# Patient Record
Sex: Female | Born: 1960 | Race: White | Hispanic: No | Marital: Married | Smoking: Never smoker
Health system: Southern US, Community
[De-identification: ages and names within clinical notes are randomized; demographics above are authoritative.]

## PROBLEM LIST (undated history)

## (undated) DIAGNOSIS — M8580 Other specified disorders of bone density and structure, unspecified site: Secondary | ICD-10-CM

## (undated) DIAGNOSIS — B019 Varicella without complication: Secondary | ICD-10-CM

## (undated) DIAGNOSIS — E559 Vitamin D deficiency, unspecified: Secondary | ICD-10-CM

## (undated) DIAGNOSIS — F329 Major depressive disorder, single episode, unspecified: Secondary | ICD-10-CM

## (undated) DIAGNOSIS — I10 Essential (primary) hypertension: Secondary | ICD-10-CM

## (undated) DIAGNOSIS — M533 Sacrococcygeal disorders, not elsewhere classified: Secondary | ICD-10-CM

## (undated) DIAGNOSIS — Z0000 Encounter for general adult medical examination without abnormal findings: Secondary | ICD-10-CM

## (undated) DIAGNOSIS — F419 Anxiety disorder, unspecified: Secondary | ICD-10-CM

## (undated) DIAGNOSIS — Z800 Family history of malignant neoplasm of digestive organs: Secondary | ICD-10-CM

## (undated) DIAGNOSIS — R7989 Other specified abnormal findings of blood chemistry: Secondary | ICD-10-CM

## (undated) DIAGNOSIS — R030 Elevated blood-pressure reading, without diagnosis of hypertension: Secondary | ICD-10-CM

## (undated) DIAGNOSIS — D649 Anemia, unspecified: Secondary | ICD-10-CM

## (undated) DIAGNOSIS — R000 Tachycardia, unspecified: Secondary | ICD-10-CM

## (undated) DIAGNOSIS — F32A Depression, unspecified: Secondary | ICD-10-CM

## (undated) DIAGNOSIS — E785 Hyperlipidemia, unspecified: Secondary | ICD-10-CM

## (undated) DIAGNOSIS — E663 Overweight: Secondary | ICD-10-CM

## (undated) DIAGNOSIS — K219 Gastro-esophageal reflux disease without esophagitis: Secondary | ICD-10-CM

## (undated) HISTORY — DX: Anxiety disorder, unspecified: F41.9

## (undated) HISTORY — DX: Major depressive disorder, single episode, unspecified: F32.9

## (undated) HISTORY — DX: Varicella without complication: B01.9

## (undated) HISTORY — DX: Overweight: E66.3

## (undated) HISTORY — DX: Tachycardia, unspecified: R00.0

## (undated) HISTORY — DX: Anemia, unspecified: D64.9

## (undated) HISTORY — DX: Elevated blood-pressure reading, without diagnosis of hypertension: R03.0

## (undated) HISTORY — DX: Other specified disorders of bone density and structure, unspecified site: M85.80

## (undated) HISTORY — DX: Hyperlipidemia, unspecified: E78.5

## (undated) HISTORY — PX: COLONOSCOPY: SHX174

## (undated) HISTORY — DX: Other specified abnormal findings of blood chemistry: R79.89

## (undated) HISTORY — DX: Vitamin D deficiency, unspecified: E55.9

## (undated) HISTORY — DX: Encounter for general adult medical examination without abnormal findings: Z00.00

## (undated) HISTORY — DX: Depression, unspecified: F32.A

## (undated) HISTORY — DX: Sacrococcygeal disorders, not elsewhere classified: M53.3

## (undated) HISTORY — DX: Family history of malignant neoplasm of digestive organs: Z80.0

## (undated) HISTORY — DX: Gastro-esophageal reflux disease without esophagitis: K21.9

---

## 2000-09-05 ENCOUNTER — Other Ambulatory Visit: Admission: RE | Admit: 2000-09-05 | Discharge: 2000-09-05 | Payer: Self-pay | Admitting: *Deleted

## 2002-12-11 ENCOUNTER — Emergency Department (HOSPITAL_COMMUNITY): Admission: EM | Admit: 2002-12-11 | Discharge: 2002-12-11 | Payer: Self-pay | Admitting: Emergency Medicine

## 2002-12-11 ENCOUNTER — Encounter: Payer: Self-pay | Admitting: Emergency Medicine

## 2003-01-15 ENCOUNTER — Other Ambulatory Visit: Admission: RE | Admit: 2003-01-15 | Discharge: 2003-01-15 | Payer: Self-pay | Admitting: Obstetrics and Gynecology

## 2004-07-31 LAB — HM COLONOSCOPY

## 2004-10-27 ENCOUNTER — Other Ambulatory Visit: Admission: RE | Admit: 2004-10-27 | Discharge: 2004-10-27 | Payer: Self-pay | Admitting: Obstetrics and Gynecology

## 2004-11-28 ENCOUNTER — Encounter: Admission: RE | Admit: 2004-11-28 | Discharge: 2004-11-28 | Payer: Self-pay | Admitting: *Deleted

## 2004-12-15 ENCOUNTER — Encounter: Admission: RE | Admit: 2004-12-15 | Discharge: 2004-12-15 | Payer: Self-pay | Admitting: *Deleted

## 2005-07-31 HISTORY — PX: GUM SURGERY: SHX658

## 2006-02-12 ENCOUNTER — Encounter: Admission: RE | Admit: 2006-02-12 | Discharge: 2006-02-12 | Payer: Self-pay | Admitting: Obstetrics and Gynecology

## 2006-05-09 ENCOUNTER — Other Ambulatory Visit: Admission: RE | Admit: 2006-05-09 | Discharge: 2006-05-09 | Payer: Self-pay | Admitting: Obstetrics and Gynecology

## 2007-02-15 ENCOUNTER — Ambulatory Visit (HOSPITAL_BASED_OUTPATIENT_CLINIC_OR_DEPARTMENT_OTHER): Admission: RE | Admit: 2007-02-15 | Discharge: 2007-02-15 | Payer: Self-pay | Admitting: *Deleted

## 2007-08-01 HISTORY — PX: OTHER SURGICAL HISTORY: SHX169

## 2010-04-19 ENCOUNTER — Encounter: Admission: RE | Admit: 2010-04-19 | Discharge: 2010-04-19 | Payer: Self-pay | Admitting: Obstetrics and Gynecology

## 2010-08-21 ENCOUNTER — Encounter (HOSPITAL_COMMUNITY): Payer: Self-pay | Admitting: Obstetrics and Gynecology

## 2010-12-13 NOTE — Op Note (Signed)
Sheryl [name], Sheryl [name]             ACCOUNT NO.:  [id number]   MEDICAL RECORD NO.:  [id number]          PATIENT TYPE:  AMB   LOCATION:  DSC                          FACILITY:  [facility]   PHYSICIAN:  Tennis Must Meyerdierks, M.D.DATE OF BIRTH:  25-Sep-1960   DATE OF PROCEDURE:  02/15/2007  DATE OF DISCHARGE:                               OPERATIVE REPORT   PREOPERATIVE DIAGNOSIS:  Abscess, right hand, metatarsophalangeal joint  of the right long finger.   POSTOPERATIVE DIAGNOSIS:  Abscess, right hand, metatarsophalangeal joint  of the right long finger.   PROCEDURE:  Incision and drainage of abscess of right hand.   SURGEON:  Lowell Bouton, M.D.   ANESTHESIA:  General.   OPERATIVE FINDINGS:  The patient had an abscess in the subcutaneous  tissue surrounding the MP joint.  The joint was opened and an arthrotomy  was performed, but there was no gross purulence.   PROCEDURE:  Under general anesthesia with a tourniquet on the right arm,  the right hand was prepped and draped in the usual fashion and after  elevating the limb, the tourniquet was inflated to 250 mmHg.  A  longitudinal incision was made over the dorsum of the MP joint of the  right long finger and carried through the subcutaneous tissues.  The  abscess was cultured.  There was gross purulent material.  A  longitudinal incision was then made in the radial side of the capsule of  the MP joint and it was opened down to the joint.  A Freer elevator was  placed in the joint and the articular surfaces were smooth.  There was  no gross pus in the joint.  The wound was irrigated copiously with  saline; 0.5% Marcaine was placed in the skin edges for pain control.  The wound was packed open and one suture was placed using 4-0 nylon.  Sterile dressings were applied followed by a protective splint.  The  patient tolerated the procedure well and went to the recovery room,  awake and stable in good condition.      Lowell Bouton, M.D.  Electronically Signed     EMM/MEDQ  D:  02/15/2007  T:  02/16/2007  Job:  [id number]   cc:   Ace Gins, MD

## 2011-05-15 LAB — ANAEROBIC CULTURE

## 2011-05-15 LAB — CULTURE, ROUTINE-ABSCESS: Culture: NO GROWTH

## 2011-06-07 ENCOUNTER — Encounter: Payer: Self-pay | Admitting: Family Medicine

## 2011-06-07 ENCOUNTER — Ambulatory Visit (INDEPENDENT_AMBULATORY_CARE_PROVIDER_SITE_OTHER): Payer: 59 | Admitting: Family Medicine

## 2011-06-07 VITALS — BP 121/89 | HR 93 | Temp 97.9°F | Ht 60.75 in | Wt 158.8 lb

## 2011-06-07 DIAGNOSIS — F329 Major depressive disorder, single episode, unspecified: Secondary | ICD-10-CM | POA: Insufficient documentation

## 2011-06-07 DIAGNOSIS — F32A Depression, unspecified: Secondary | ICD-10-CM | POA: Insufficient documentation

## 2011-06-07 DIAGNOSIS — R5383 Other fatigue: Secondary | ICD-10-CM

## 2011-06-07 DIAGNOSIS — Z23 Encounter for immunization: Secondary | ICD-10-CM

## 2011-06-07 DIAGNOSIS — B019 Varicella without complication: Secondary | ICD-10-CM | POA: Insufficient documentation

## 2011-06-07 DIAGNOSIS — R5381 Other malaise: Secondary | ICD-10-CM

## 2011-06-07 DIAGNOSIS — Z0000 Encounter for general adult medical examination without abnormal findings: Secondary | ICD-10-CM

## 2011-06-07 DIAGNOSIS — F419 Anxiety disorder, unspecified: Secondary | ICD-10-CM

## 2011-06-07 DIAGNOSIS — F411 Generalized anxiety disorder: Secondary | ICD-10-CM

## 2011-06-07 DIAGNOSIS — D649 Anemia, unspecified: Secondary | ICD-10-CM

## 2011-06-07 DIAGNOSIS — E669 Obesity, unspecified: Secondary | ICD-10-CM | POA: Insufficient documentation

## 2011-06-07 DIAGNOSIS — K219 Gastro-esophageal reflux disease without esophagitis: Secondary | ICD-10-CM

## 2011-06-07 DIAGNOSIS — N951 Menopausal and female climacteric states: Secondary | ICD-10-CM

## 2011-06-07 DIAGNOSIS — E663 Overweight: Secondary | ICD-10-CM

## 2011-06-07 DIAGNOSIS — Z800 Family history of malignant neoplasm of digestive organs: Secondary | ICD-10-CM

## 2011-06-07 HISTORY — DX: Encounter for general adult medical examination without abnormal findings: Z00.00

## 2011-06-07 HISTORY — DX: Family history of malignant neoplasm of digestive organs: Z80.0

## 2011-06-07 NOTE — Progress Notes (Signed)
Sheryl [name] [id number] 10-28-1960 06/07/2011      Progress Note New Patient  Subjective  Chief Complaint  Chief Complaint  Patient presents with  . Establish Care    new patient    HPI  Patient is a 50 yo caucasian female in today for new patient appt. she reports is not a primary care doctor in quite some time. Previously had her Paps done at [facility] and was seen at the [facility] clinic for infections. She has no acute concerns at this time but does have a bad family history: Cancer diabetes heart disease would like to start monitoring things. She'll history of anxiety and started having anxiety roughly 10 years ago after her breast was brought. His recent episode depressed and says overall she's doing better. She follows with Dr. Valinda Hoar and feels stable on her current medications. She uses Xanax each night for sleep and then roughly only once a week the details and the as well. No recent illness, fevers, chills, chest pain or palpitations, shortness of breath, GI or GU concerns noted. She is concerned about increased fatigue and weight gain. Does feel as if she is perimenopausal this time and is requesting lab work.  Past Medical History  Diagnosis Date  . Chicken pox as a child  . Anxiety   . GERD (gastroesophageal reflux disease)   . Depression   . Overweight   . Anemia   . Preventative health care 06/07/2011    Past Surgical History  Procedure Date  . Gum surgery 2007  . Right hand surgery 2009    infection debrided  . Cesarean section 1985 and 1989    X 2    Family History  Problem Relation Age of Onset  . Heart attack Mother   . Diabetes Mother     type 2  . Hypertension Mother   . Hyperlipidemia Mother   . Cancer Father     colon  . Heart attack Father   . Hypertension Sister   . Diabetes Sister     type 2  . Hypertension Sister   . Diabetes Maternal Grandmother   . Hypertension Maternal Grandmother   . Hyperlipidemia Maternal  Grandmother     History   Social History  . Marital Status: Married    Spouse Name: N/A    Number of Children: N/A  . Years of Education: N/A   Occupational History  . Not on file.   Social History Main Topics  . Smoking status: Never Smoker   . Smokeless tobacco: Never Used  . Alcohol Use: Yes     glass of wine nightly  . Drug Use: No  . Sexually Active: Yes -- Female partner(s)   Other Topics Concern  . Not on file   Social History Narrative  . No narrative on file    No current outpatient prescriptions on file prior to visit.    Allergies  Allergen Reactions  . Penicillins Rash    Review of Systems  Review of Systems  Constitutional: Positive for malaise/fatigue. Negative for fever, chills and weight loss.  HENT: Negative for hearing loss, nosebleeds and congestion.   Eyes: Negative for discharge.  Respiratory: Negative for cough, sputum production, shortness of breath and wheezing.   Cardiovascular: Negative for chest pain, palpitations and leg swelling.  Gastrointestinal: Negative for heartburn, nausea, vomiting, abdominal pain, diarrhea, constipation and blood in stool.  Genitourinary: Negative for dysuria, urgency, frequency and hematuria.  Musculoskeletal: Negative for myalgias, back  pain and falls.  Skin: Negative for rash.  Neurological: Negative for dizziness, tremors, sensory change, focal weakness, loss of consciousness, weakness and headaches.  Endo/Heme/Allergies: Negative for polydipsia. Does not bruise/bleed easily.  Psychiatric/Behavioral: Negative for depression and suicidal ideas. The patient is nervous/anxious and has insomnia.     Objective  BP 121/89  Pulse 93  Temp(Src) 97.9 F (36.6 C) (Oral)  Ht 5' 0.75" (1.543 m)  Wt 158 lb 12.8 oz (72.031 kg)  BMI 30.25 kg/m2  SpO2 98%  LMP 05/24/2011  Physical Exam  Physical Exam  Constitutional: She is oriented to person, place, and time and well-developed, well-nourished, and in no  distress. No distress.  HENT:  Head: Normocephalic and atraumatic.  Right Ear: External ear normal.  Left Ear: External ear normal.  Nose: Nose normal.  Mouth/Throat: Oropharynx is clear and moist. No oropharyngeal exudate.  Eyes: Conjunctivae are normal. Pupils are equal, round, and reactive to light. Right eye exhibits no discharge. Left eye exhibits no discharge. No scleral icterus.  Neck: Normal range of motion. Neck supple. No thyromegaly present.  Cardiovascular: Normal rate, regular rhythm, normal heart sounds and intact distal pulses.   No murmur heard. Pulmonary/Chest: Effort normal and breath sounds normal. No respiratory distress. She has no wheezes. She has no rales.  Abdominal: Soft. Bowel sounds are normal. She exhibits no distension and no mass. There is no tenderness.  Musculoskeletal: Normal range of motion. She exhibits no edema and no tenderness.  Lymphadenopathy:    She has no cervical adenopathy.  Neurological: She is alert and oriented to person, place, and time. She has normal reflexes. No cranial nerve deficit. Coordination normal.  Skin: Skin is warm and dry. No rash noted. She is not diaphoretic.  Psychiatric: Mood, memory and affect normal.       Assessment & Plan  Preventative health care Patient declines flu shot but agrees to Tdap today. She is due for pap and mgm  In January we will request old records and have her back to have those done.   Overweight Worsening fatigue also noted. Patient interested in having her hormones checked will check fasting labs including fsh and lh at that time.   GERD (gastroesophageal reflux disease) Patient reports good response to Pantoprazole, has tried to come off in the past but symptoms return. She is encouraged to try taking it every other day to minimize SE and to avoid spicy and fatty foods. Raise the head of the bed and do not eat too close to bed  Anxiety She feels she is doing well on her current meds and dose  not need any meds adjusted  Anemia Patient reports a previous history, will check CBC with lab work  FH: colon cancer Last colonoscopy in 2006, patient agrees to referral for repeat colonoscopy

## 2011-06-07 NOTE — Assessment & Plan Note (Signed)
She feels she is doing well on her current meds and dose not need any meds adjusted

## 2011-06-07 NOTE — Assessment & Plan Note (Signed)
Last colonoscopy in 2006, patient agrees to referral for repeat colonoscopy

## 2011-06-07 NOTE — Assessment & Plan Note (Signed)
Worsening fatigue also noted. Patient interested in having her hormones checked will check fasting labs including fsh and lh at that time.

## 2011-06-07 NOTE — Assessment & Plan Note (Signed)
Patient reports a previous history, will check CBC with lab work

## 2011-06-07 NOTE — Patient Instructions (Signed)

## 2011-06-07 NOTE — Assessment & Plan Note (Signed)
Patient declines flu shot but agrees to Tdap today. She is due for pap and mgm  In January we will request old records and have her back to have those done.

## 2011-06-07 NOTE — Assessment & Plan Note (Signed)
Patient reports good response to Pantoprazole, has tried to come off in the past but symptoms return. She is encouraged to try taking it every other day to minimize SE and to avoid spicy and fatty foods. Raise the head of the bed and do not eat too close to bed

## 2011-06-09 ENCOUNTER — Encounter: Payer: Self-pay | Admitting: Internal Medicine

## 2011-06-12 ENCOUNTER — Other Ambulatory Visit: Payer: 59

## 2011-06-27 ENCOUNTER — Ambulatory Visit (AMBULATORY_SURGERY_CENTER): Payer: 59

## 2011-06-27 ENCOUNTER — Telehealth: Payer: Self-pay | Admitting: Family Medicine

## 2011-06-27 ENCOUNTER — Encounter: Payer: Self-pay | Admitting: Internal Medicine

## 2011-06-27 VITALS — Ht 61.0 in | Wt 164.3 lb

## 2011-06-27 DIAGNOSIS — Z1211 Encounter for screening for malignant neoplasm of colon: Secondary | ICD-10-CM

## 2011-06-27 DIAGNOSIS — Z800 Family history of malignant neoplasm of digestive organs: Secondary | ICD-10-CM

## 2011-06-27 MED ORDER — PEG-KCL-NACL-NASULF-NA ASC-C 100 G PO SOLR: 1.0000 | Freq: Once | ORAL | Status: AC

## 2011-06-27 NOTE — Telephone Encounter (Signed)
Message copied by Carmelia Bake on Tue Jun 27, 2011  2:46 PM ------      Message from: Danise Edge A      Created: Tue Jun 27, 2011 12:13 PM      Regarding: RE: Medical record request       Please check with Nestor Ramp to see how much it would cost just to get last 2 paps. If still hi, have Christy check with patient about what she wants to do      ----- Message -----         From: Carmelia Bake         Sent: 06/27/2011  11:42 AM           To: Danise Edge, MD      Subject: Medical record request                                   I received a phone call from [facility] that there is a $45 charge to request records since patient hasn't been seen since 2007 and her records are in storage, do you want them to send them?

## 2011-06-27 NOTE — Telephone Encounter (Signed)
It will cost $35 to get the last 2 PAPs per Amy at [facility] [phone]

## 2011-06-28 NOTE — Telephone Encounter (Signed)
I informed patient the $35 cost for the records and she states that's ok. Please get the records for [name]

## 2011-06-28 NOTE — Telephone Encounter (Signed)
SW [employer], they would like patient to contact them to make payment arrangements. SW patient she is going to call them.

## 2011-07-07 ENCOUNTER — Telehealth: Payer: Self-pay | Admitting: Internal Medicine

## 2011-07-07 NOTE — Telephone Encounter (Signed)
Tried to reach pt several times regarding prep instructions, unable to LM. Ulis Rias RN

## 2011-07-07 NOTE — Telephone Encounter (Signed)
Called pt at home number and mobile number ,unable to leave message. Will try again. Ulis Rias RN

## 2011-07-11 ENCOUNTER — Encounter: Payer: Self-pay | Admitting: Internal Medicine

## 2011-07-11 ENCOUNTER — Ambulatory Visit (AMBULATORY_SURGERY_CENTER): Payer: 59 | Admitting: Internal Medicine

## 2011-07-11 DIAGNOSIS — D126 Benign neoplasm of colon, unspecified: Secondary | ICD-10-CM

## 2011-07-11 DIAGNOSIS — Z800 Family history of malignant neoplasm of digestive organs: Secondary | ICD-10-CM

## 2011-07-11 DIAGNOSIS — Z1211 Encounter for screening for malignant neoplasm of colon: Secondary | ICD-10-CM

## 2011-07-11 MED ORDER — SODIUM CHLORIDE 0.9 % IV SOLN: 500.0000 mL | INTRAVENOUS | Status: DC

## 2011-07-11 NOTE — Progress Notes (Signed)
Patient did not experience any of the following events: a burn prior to discharge; a fall within the facility; wrong site/side/patient/procedure/implant event; or a hospital transfer or hospital admission upon discharge from the facility. (G8907) Patient did not have preoperative order for IV antibiotic SSI prophylaxis. (G8918)  

## 2011-07-11 NOTE — Patient Instructions (Signed)
Discharge instructions per green and blue sheets  Handouts on polyps, diverticulosis and high fiber diet and hemorrhoids  Repeat colonoscopy pending pathology results. We will mail you a letter in 1-2 weeks with those results and Dr Lauro Franklin recommendations

## 2011-07-11 NOTE — Progress Notes (Signed)
Per the pt, "you know I have anxiety, I hope I don't get nervous".  I assured the pt we would take very good care of her.  I encouraged her to take some slow deep breath and I breathed with her.  Pt thanked me and smiled. Maw  Hung 2nd bag of normal saline 500 ml at 09:36. Maw  Pt was cramping with the scope advancement to the cecum.  Medications were titrated per Dr. Lauro Franklin orders.  Once the cecum was reached and the scope was being withdrawn, the pt relaxed and went to sleep and rested comfortably with her eyes closed. Maw  Pt tolerated the colonoscopy well. maw

## 2011-07-11 NOTE — Op Note (Signed)
[facility] [address] [place], [place]  [address]  COLONOSCOPY PROCEDURE REPORT  PATIENT:  Sheryl [name], Sheryl [name]  MR#:  [id number] BIRTHDATE:  11-09-1960, 50 yrs. old  GENDER:  female ENDOSCOPIST:  Carie Caddy. Magan Winnett, MD REF. BY:  Reuel Derby, M.D. PROCEDURE DATE:  07/11/2011 PROCEDURE:  Colon with cold biopsy polypectomy ASA CLASS:  Class I INDICATIONS:  Elevated Risk Screening (2nd colonoscopy), FH of colon cancer (father) MEDICATIONS:   These medications were titrated to patient response per physician's verbal order, Fentanyl 100 mcg IV, Versed 10 mg IV  DESCRIPTION OF PROCEDURE:   After the risks benefits and alternatives of the procedure were thoroughly explained, informed consent was obtained.  Digital rectal exam was performed and revealed no rectal masses.   The LB [id number]-[id number] endoscope was introduced through the anus and advanced to the terminal ileum which was intubated for a short distance, without limitations. The quality of the prep was good, using MoviPrep.  The instrument was then slowly withdrawn as the colon was fully examined. <<PROCEDUREIMAGES>>  FINDINGS:  The terminal ileum appeared normal.  Five sessile polyps measuring 2 - 5 mm were found in the ascending colon (3), hepatic flexure (1), and transverse colon (1). The polyps were removed using cold biopsy forceps.  Moderate diverticulosis was found in the sigmoid colon.  Internal Hemorrhoids were found. Retroflexed views in the rectum revealed no other findings other than those already described.  The scope was then withdrawn from the cecum and the procedure completed.  COMPLICATIONS:  None ENDOSCOPIC IMPRESSION: 1) Normal terminal ileum 2) Five sessile polyps in the ascending colon (3), hepatic flexure (1), and transverse colon (1).  Polyps removed and sent to pathology. 3) Moderate diverticulosis in the sigmoid colon 4) Small internal hemorrhoids  RECOMMENDATIONS: 1) Await pathology  results 2) High fiber diet. 3) If the polyp(s) removed today are proven to be adenomatous (pre-cancerous) polyps, you will need a colonoscopy in 3 years. Otherwise you should continue to follow colorectal cancer screening guidelines for "elevated risk" patients with a colonoscopy in 5 years, based on your father's history of colon cancer. 4) You will receive a letter within 1-2 weeks with the results of your biopsy as well as final recommendations. Please call my office if you have not received a letter after 3 weeks.  Carie Caddy. Rhea Belton, MD  CC:  Reuel Derby, MD The Patient  n. eSIGNEDCarie Caddy. Jolea Dolle at 07/11/2011 10:05 AM  Sheryl [name], [id number]

## 2011-07-12 ENCOUNTER — Telehealth: Payer: Self-pay

## 2011-07-12 NOTE — Telephone Encounter (Signed)

## 2011-07-18 ENCOUNTER — Encounter: Payer: Self-pay | Admitting: Internal Medicine

## 2011-08-08 ENCOUNTER — Other Ambulatory Visit: Payer: Self-pay | Admitting: Family Medicine

## 2011-08-08 ENCOUNTER — Ambulatory Visit (INDEPENDENT_AMBULATORY_CARE_PROVIDER_SITE_OTHER): Payer: 59 | Admitting: Family Medicine

## 2011-08-08 ENCOUNTER — Encounter: Payer: Self-pay | Admitting: Family Medicine

## 2011-08-08 DIAGNOSIS — F419 Anxiety disorder, unspecified: Secondary | ICD-10-CM

## 2011-08-08 DIAGNOSIS — Z124 Encounter for screening for malignant neoplasm of cervix: Secondary | ICD-10-CM

## 2011-08-08 DIAGNOSIS — K219 Gastro-esophageal reflux disease without esophagitis: Secondary | ICD-10-CM

## 2011-08-08 DIAGNOSIS — D649 Anemia, unspecified: Secondary | ICD-10-CM

## 2011-08-08 DIAGNOSIS — E663 Overweight: Secondary | ICD-10-CM

## 2011-08-08 DIAGNOSIS — Z800 Family history of malignant neoplasm of digestive organs: Secondary | ICD-10-CM

## 2011-08-08 DIAGNOSIS — Z01419 Encounter for gynecological examination (general) (routine) without abnormal findings: Secondary | ICD-10-CM | POA: Insufficient documentation

## 2011-08-08 DIAGNOSIS — F411 Generalized anxiety disorder: Secondary | ICD-10-CM

## 2011-08-08 DIAGNOSIS — Z0000 Encounter for general adult medical examination without abnormal findings: Secondary | ICD-10-CM

## 2011-08-08 MED ORDER — PANTOPRAZOLE SODIUM 40 MG PO TBEC: 40.0000 mg | DELAYED_RELEASE_TABLET | Freq: Every day | ORAL | Status: DC

## 2011-08-08 NOTE — Patient Instructions (Signed)

## 2011-08-09 LAB — HEPATIC FUNCTION PANEL
AST: 26 U/L (ref 0–37)
Albumin: 3.9 g/dL (ref 3.5–5.2)
Alkaline Phosphatase: 92 U/L (ref 39–117)
Total Bilirubin: 0.5 mg/dL (ref 0.3–1.2)
Total Protein: 6.7 g/dL (ref 6.0–8.3)

## 2011-08-09 LAB — BASIC METABOLIC PANEL
CO2: 21 mEq/L (ref 19–32)
Chloride: 103 mEq/L (ref 96–112)
Creat: 0.91 mg/dL (ref 0.50–1.10)
Potassium: 4.3 mEq/L (ref 3.5–5.3)
Sodium: 138 mEq/L (ref 135–145)

## 2011-08-09 LAB — LIPID PANEL
HDL: 118 mg/dL (ref >39)
LDL Cholesterol: 122 mg/dL — ABNORMAL HIGH (ref 0–99)
Total CHOL/HDL Ratio: 2.2 Ratio
Triglycerides: 104 mg/dL (ref <150)

## 2011-08-09 LAB — CBC
Platelets: 231 10*3/uL (ref 150–400)
RBC: 4.73 MIL/uL (ref 3.87–5.11)
RDW: 13.6 % (ref 11.5–15.5)
WBC: 5 10*3/uL (ref 4.0–10.5)

## 2011-08-13 ENCOUNTER — Encounter: Payer: Self-pay | Admitting: Family Medicine

## 2011-08-13 NOTE — Progress Notes (Signed)
Patient ID: Sheryl [name], female   DOB: [birth year], 51 y.o.   MRN: [id number] JADYN BRASHER [id number] 26-Oct-1960 08/13/2011      Progress Note-Follow Up  Subjective  Chief Complaint  Chief Complaint  Patient presents with  . Gynecologic Exam    pap    HPI  Patient is a 51 year old Caucasian female who is in today for GYN exam. Last Pap was 2 years ago and was normal. She offers no GYN complaints. No breast pain and tenderness lesions or concerns. She's had no recent illness, fevers, chills, chest pain, palpitations, shortness of breath, GI or GU concerns recently.  Past Medical History  Diagnosis Date  . Chicken pox as a child  . Anxiety   . GERD (gastroesophageal reflux disease)   . Depression   . Overweight   . Anemia   . Preventative health care 06/07/2011  . FH: colon cancer 06/07/2011    Past Surgical History  Procedure Date  . Gum surgery 2007  . Right hand surgery 2009    infection debrided  . Cesarean section 1985 and 1989    X 2  . Colonoscopy     Family History  Problem Relation Age of Onset  . Heart attack Mother   . Diabetes Mother     type 2  . Hypertension Mother   . Hyperlipidemia Mother   . Cancer Father     colon  . Heart attack Father   . Colon cancer Father   . Hypertension Sister   . Diabetes Sister     type 2  . Hypertension Sister   . Diabetes Maternal Grandmother   . Hypertension Maternal Grandmother   . Hyperlipidemia Maternal Grandmother     History   Social History  . Marital Status: Married    Spouse Name: N/A    Number of Children: N/A  . Years of Education: N/A   Occupational History  . Not on file.   Social History Main Topics  . Smoking status: Never Smoker   . Smokeless tobacco: Never Used  . Alcohol Use: 7.0 oz/week    14 drink(s) per week     glass of wine nightly  . Drug Use: No  . Sexually Active: Yes -- Female partner(s)   Other Topics Concern  . Not on file   Social History Narrative  . No  narrative on file    Current Outpatient Prescriptions on File Prior to Visit  Medication Sig Dispense Refill  . ALPRAZolam (XANAX) 0.25 MG tablet Take 0.25 mg by mouth at bedtime.       Marland Kitchen FLUoxetine (PROZAC) 20 MG capsule Take 20 mg by mouth daily.        . Multiple Vitamin (MULTIVITAMIN) capsule Take 1 capsule by mouth daily.        Marland Kitchen zolpidem (AMBIEN) 10 MG tablet Take 10 mg by mouth at bedtime as needed.          Allergies  Allergen Reactions  . Penicillins Rash    Review of Systems  Review of Systems  Constitutional: Negative for fever and malaise/fatigue.  HENT: Negative for congestion.   Eyes: Negative for discharge.  Respiratory: Negative for shortness of breath.   Cardiovascular: Negative for chest pain, palpitations and leg swelling.  Gastrointestinal: Negative for nausea, abdominal pain and diarrhea.  Genitourinary: Negative for dysuria.  Musculoskeletal: Negative for falls.  Skin: Negative for rash.  Neurological: Negative for loss of consciousness and headaches.  Endo/Heme/Allergies: Negative for polydipsia.  Psychiatric/Behavioral: Negative for depression and suicidal ideas. The patient is not nervous/anxious and does not have insomnia.     Objective  BP 137/88  Pulse 77  Temp(Src) 98.4 F (36.9 C) (Temporal)  Ht 5\' 1"  (1.549 m)  Wt 163 lb 12.8 oz (74.299 kg)  BMI 30.95 kg/m2  SpO2 98%  Physical Exam  Physical Exam  Constitutional: She is oriented to person, place, and time and well-developed, well-nourished, and in no distress. No distress.  HENT:  Head: Normocephalic and atraumatic.  Eyes: Conjunctivae are normal.  Neck: Neck supple. No thyromegaly present.  Cardiovascular: Normal rate, regular rhythm and normal heart sounds.   No murmur heard. Pulmonary/Chest: Effort normal and breath sounds normal. She has no wheezes.  Abdominal: She exhibits no distension and no mass.  Musculoskeletal: She exhibits no edema.  Lymphadenopathy:    She has no  cervical adenopathy.  Neurological: She is alert and oriented to person, place, and time.  Skin: Skin is warm and dry. No rash noted. She is not diaphoretic.  Psychiatric: Memory, affect and judgment normal.    Lab Results  Component Value Date   TSH 1.880 08/08/2011   Lab Results  Component Value Date   WBC 5.0 08/08/2011   HGB 13.5 08/08/2011   HCT 41.8 08/08/2011   MCV 88.4 08/08/2011   PLT 231 08/08/2011   Lab Results  Component Value Date   CREATININE 0.91 08/08/2011   BUN 16 08/08/2011   NA 138 08/08/2011   K 4.3 08/08/2011   CL 103 08/08/2011   CO2 21 08/08/2011   Lab Results  Component Value Date   ALT 20 08/08/2011   AST 26 08/08/2011   ALKPHOS 92 08/08/2011   BILITOT 0.5 08/08/2011   Lab Results  Component Value Date   CHOL 261* 08/08/2011   Lab Results  Component Value Date   HDL 118 08/08/2011   Lab Results  Component Value Date   LDLCALC 122* 08/08/2011   Lab Results  Component Value Date   TRIG 104 08/08/2011   Lab Results  Component Value Date   CHOLHDL 2.2 08/08/2011     Assessment & Plan   Anxiety Is continuing on Fluoxetine and Xanax prn, doing well  Cervical cancer screening No gyn or breast c/o today, pap taken will continue with annual MGMs with [facility]  Anemia Resolved with blood work  Overweight Encouraged DASH diet, increase activity  GERD (gastroesophageal reflux disease) Avoid offending foods, use Pantoprazole 40mg  daily   FH: colon cancer Has no GI c/o today  Preventative health care Encouraged heart healthy diet, increased exercise, had flu shot already

## 2011-08-15 ENCOUNTER — Telehealth: Payer: Self-pay

## 2011-08-15 NOTE — Telephone Encounter (Signed)
Left a detailed message on patients answering machine.

## 2011-08-15 NOTE — Telephone Encounter (Signed)
Message copied by Court Joy on Tue Aug 15, 2011 11:35 AM ------      Message from: Danise Edge A      Created: Mon Aug 14, 2011  4:59 PM       Notify pap normal

## 2011-08-18 DIAGNOSIS — Z124 Encounter for screening for malignant neoplasm of cervix: Secondary | ICD-10-CM | POA: Insufficient documentation

## 2011-08-18 NOTE — Assessment & Plan Note (Signed)
Avoid offending foods, use Pantoprazole 40mg  daily

## 2011-08-18 NOTE — Assessment & Plan Note (Signed)
Resolved with blood work 

## 2011-08-18 NOTE — Assessment & Plan Note (Signed)
Encouraged heart healthy diet, increased exercise, had flu shot already

## 2011-08-18 NOTE — Assessment & Plan Note (Signed)
Is continuing on Fluoxetine and Xanax prn, doing well

## 2011-08-18 NOTE — Assessment & Plan Note (Signed)
Has no GI c/o today

## 2011-08-18 NOTE — Assessment & Plan Note (Signed)
Encouraged DASH diet, increase activity 

## 2011-08-18 NOTE — Assessment & Plan Note (Signed)
No gyn or breast c/o today, pap taken will continue with annual [facility] with [facility]

## 2011-09-27 ENCOUNTER — Other Ambulatory Visit (HOSPITAL_COMMUNITY)
Admission: RE | Admit: 2011-09-27 | Discharge: 2011-09-27 | Disposition: A | Discharge status: Home / Self Care | Payer: 59 | Source: Ambulatory Visit | Attending: Family Medicine | Admitting: Family Medicine

## 2011-10-06 ENCOUNTER — Ambulatory Visit (INDEPENDENT_AMBULATORY_CARE_PROVIDER_SITE_OTHER): Payer: 59 | Admitting: Family Medicine

## 2011-10-06 ENCOUNTER — Encounter: Payer: Self-pay | Admitting: Family Medicine

## 2011-10-06 VITALS — BP 136/91 | HR 82 | Temp 99.2°F | Ht 61.0 in | Wt 164.1 lb

## 2011-10-06 DIAGNOSIS — J329 Chronic sinusitis, unspecified: Secondary | ICD-10-CM

## 2011-10-06 MED ORDER — ALIGN PO CAPS: 1.0000 | ORAL_CAPSULE | Freq: Every day | ORAL | Status: DC

## 2011-10-06 MED ORDER — SALINE NASAL SPRAY 0.65 % NA SOLN: 1.0000 | NASAL | Status: DC | PRN

## 2011-10-06 MED ORDER — LEVOFLOXACIN 500 MG PO TABS: 500.0000 mg | ORAL_TABLET | Freq: Every day | ORAL | Status: AC

## 2011-10-06 NOTE — Patient Instructions (Signed)

## 2011-10-08 DIAGNOSIS — J019 Acute sinusitis, unspecified: Secondary | ICD-10-CM | POA: Insufficient documentation

## 2011-10-08 NOTE — Progress Notes (Signed)
Patient ID: Sheryl [name], female   DOB: [birth year], 51 y.o.   MRN: [id number] PRUE LINGENFELTER [id number] 11-09-1960 10/08/2011      Progress Note-Follow Up  Subjective  Chief Complaint  Chief Complaint  Patient presents with  . Sinusitis    X 1 week, cough, throat hurts, right ear pain, cough w/ phlegm - blood, pressure headaches    HPI  Patinet is a 51 year old caucasian female in today for uri symptoms x 1 week. She is struggling with fevers, chills, malaise, myalgias, anorexia, diarrhea, HA, nasal congestion, yellow bloody rhinorrhea and cough. She denies CP/palp/SOB/GU c/o. Has not taken any otc meds for this thus far.  Past Medical History  Diagnosis Date  . Chicken pox as a child  . Anxiety   . GERD (gastroesophageal reflux disease)   . Depression   . Overweight   . Anemia   . Preventative health care 06/07/2011  . FH: colon cancer 06/07/2011    Past Surgical History  Procedure Date  . Gum surgery 2007  . Right hand surgery 2009    infection debrided  . Cesarean section 1985 and 1989    X 2  . Colonoscopy     Family History  Problem Relation Age of Onset  . Heart attack Mother   . Diabetes Mother     type 2  . Hypertension Mother   . Hyperlipidemia Mother   . Cancer Father     colon  . Heart attack Father   . Colon cancer Father   . Hypertension Sister   . Diabetes Sister     type 2  . Hypertension Sister   . Diabetes Maternal Grandmother   . Hypertension Maternal Grandmother   . Hyperlipidemia Maternal Grandmother     History   Social History  . Marital Status: Married    Spouse Name: N/A    Number of Children: N/A  . Years of Education: N/A   Occupational History  . Not on file.   Social History Main Topics  . Smoking status: Never Smoker   . Smokeless tobacco: Never Used  . Alcohol Use: 7.0 oz/week    14 drink(s) per week     glass of wine nightly  . Drug Use: No  . Sexually Active: Yes -- Female partner(s)   Other Topics  Concern  . Not on file   Social History Narrative  . No narrative on file    Current Outpatient Prescriptions on File Prior to Visit  Medication Sig Dispense Refill  . ALPRAZolam (XANAX) 0.25 MG tablet Take 0.25 mg by mouth at bedtime.       . Multiple Vitamin (MULTIVITAMIN) capsule Take 1 capsule by mouth daily.        . pantoprazole (PROTONIX) 40 MG tablet Take 1 tablet (40 mg total) by mouth daily.  90 tablet  1  . zolpidem (AMBIEN) 10 MG tablet Take 10 mg by mouth at bedtime as needed.          Allergies  Allergen Reactions  . Penicillins Rash    Review of Systems  Review of Systems  Constitutional: Positive for fever, chills and malaise/fatigue.  HENT: Positive for ear pain and congestion.   Eyes: Negative for discharge.  Respiratory: Positive for cough, sputum production, shortness of breath and wheezing.   Cardiovascular: Negative for chest pain, palpitations and leg swelling.  Gastrointestinal: Positive for diarrhea. Negative for nausea and abdominal pain.  Genitourinary: Negative for dysuria.  Musculoskeletal: Negative  for falls.  Skin: Negative for rash.  Neurological: Positive for headaches. Negative for loss of consciousness.  Endo/Heme/Allergies: Negative for polydipsia.  Psychiatric/Behavioral: Negative for depression and suicidal ideas. The patient is not nervous/anxious and does not have insomnia.     Objective  BP 136/91  Pulse 82  Temp(Src) 99.2 F (37.3 C) (Temporal)  Ht 5\' 1"  (1.549 m)  Wt 164 lb 1.9 oz (74.444 kg)  BMI 31.01 kg/m2  SpO2 99%  LMP 09/08/2011  Physical Exam  Physical Exam  Constitutional: She is oriented to person, place, and time and well-developed, well-nourished, and in no distress. No distress.  HENT:  Head: Normocephalic and atraumatic.       Nasal mucosa is boggy and erythematous  Eyes: Conjunctivae are normal.  Neck: Neck supple. No thyromegaly present.  Cardiovascular: Normal rate, regular rhythm and normal heart  sounds.   No murmur heard. Pulmonary/Chest: Effort normal and breath sounds normal. She has no wheezes.  Abdominal: She exhibits no distension and no mass.  Musculoskeletal: She exhibits no edema.  Lymphadenopathy:    She has no cervical adenopathy.  Neurological: She is alert and oriented to person, place, and time.  Skin: Skin is warm and dry. No rash noted. She is not diaphoretic.  Psychiatric: Memory, affect and judgment normal.    Lab Results  Component Value Date   TSH 1.880 08/08/2011   Lab Results  Component Value Date   WBC 5.0 08/08/2011   HGB 13.5 08/08/2011   HCT 41.8 08/08/2011   MCV 88.4 08/08/2011   PLT 231 08/08/2011   Lab Results  Component Value Date   CREATININE 0.91 08/08/2011   BUN 16 08/08/2011   NA 138 08/08/2011   K 4.3 08/08/2011   CL 103 08/08/2011   CO2 21 08/08/2011   Lab Results  Component Value Date   ALT 20 08/08/2011   AST 26 08/08/2011   ALKPHOS 92 08/08/2011   BILITOT 0.5 08/08/2011   Lab Results  Component Value Date   CHOL 261* 08/08/2011   Lab Results  Component Value Date   HDL 118 08/08/2011   Lab Results  Component Value Date   LDLCALC 122* 08/08/2011   Lab Results  Component Value Date   TRIG 104 08/08/2011   Lab Results  Component Value Date   CHOLHDL 2.2 08/08/2011     Assessment & Plan  Sinusitis Increase rest and fluids, add antibiotic and Mucinex as well as probiotics.

## 2011-10-08 NOTE — Assessment & Plan Note (Signed)
Increase rest and fluids, add antibiotic and Mucinex as well as probiotics.

## 2011-11-07 ENCOUNTER — Other Ambulatory Visit: Payer: Self-pay

## 2011-11-07 MED ORDER — PANTOPRAZOLE SODIUM 40 MG PO TBEC: 40.0000 mg | DELAYED_RELEASE_TABLET | Freq: Every day | ORAL | Status: DC

## 2011-12-15 ENCOUNTER — Ambulatory Visit (INDEPENDENT_AMBULATORY_CARE_PROVIDER_SITE_OTHER): Payer: 59 | Admitting: Family Medicine

## 2011-12-15 ENCOUNTER — Encounter: Payer: Self-pay | Admitting: Family Medicine

## 2011-12-15 VITALS — BP 137/82 | HR 76 | Temp 98.0°F | Ht 61.0 in | Wt 171.0 lb

## 2011-12-15 DIAGNOSIS — R609 Edema, unspecified: Secondary | ICD-10-CM

## 2011-12-15 MED ORDER — FUROSEMIDE 20 MG PO TABS: ORAL_TABLET | ORAL | Status: DC

## 2011-12-15 NOTE — Patient Instructions (Signed)
Elevate your legs above the level of your heart for 20-30 min periodically over the next 1 wk.   If swelling is not going down over the next week or if worsening or new sx's.

## 2011-12-15 NOTE — Assessment & Plan Note (Signed)
I think this is a combo of recent sunburn to LE's + recent excessive sodium intake on vacation in [place]. Very low suspicion of DVT, but I asked her to return if swelling was worsening over the next week or she had onset of calf pain or SOB. I gave lasix 20mg , 1 qd x 2d, RF x 1.  Therapeutic expectations and side effect profile of medication discussed today.  Patient's questions answered.   I recommended low Na diet and elevation of LE's 20-30 min periodically.

## 2011-12-15 NOTE — Progress Notes (Signed)
OFFICE NOTE  12/15/2011  CC:  Chief Complaint  Patient presents with  . Edema    ankles x several days, vacation in [place], swelling while there; tingling sensation     HPI: Patient is a 51 y.o. Caucasian female who is here for bilateral ankle swelling. Onset 2-3 days ago after she had been in [place] a couple of days on vacation.  She describes getting a sunburn on that trip as well.  Admits to eating lots and drinking beer--lots more sodium intake than she's used to.  Says hands feel a bit swollen as well.  No SOB, no calf pain but says ankles tingle where the swelling is.  No rash.  No CP.  No fevers or malaise.  Pertinent PMH:  Past Medical History  Diagnosis Date  . Chicken pox as a child  . Anxiety   . GERD (gastroesophageal reflux disease)   . Depression   . Overweight   . Anemia   . Preventative health care 06/07/2011  . FH: colon cancer 06/07/2011    MEDS:  Outpatient Prescriptions Prior to Visit  Medication Sig Dispense Refill  . ALPRAZolam (XANAX) 0.25 MG tablet Take 0.25 mg by mouth at bedtime.       . bifidobacterium infantis (ALIGN) capsule Take 1 capsule by mouth daily.      . Multiple Vitamin (MULTIVITAMIN) capsule Take 1 capsule by mouth daily.        . pantoprazole (PROTONIX) 40 MG tablet Take 1 tablet (40 mg total) by mouth daily.  90 tablet  0  . sertraline (ZOLOFT) 25 MG tablet Take 25 mg by mouth daily.      . sodium chloride (AYR) 0.65 % nasal spray Place 1 spray into the nose as needed for congestion.  30 mL  12  . zolpidem (AMBIEN) 10 MG tablet Take 10 mg by mouth at bedtime as needed.          PE: Blood pressure 137/82, pulse 76, temperature 98 F (36.7 C), temperature source Temporal, height 5\' 1"  (1.549 m), weight 171 lb (77.565 kg). Gen: Alert, well appearing.  Patient is oriented to person, place, time, and situation. CV: RRR, no m/r/g.   LUNGS: CTA bilat, nonlabored resps, good aeration in all lung fields. LEGS: bilat doughy edema in ankles,  not much pitting to speak of.  No pitting in calves or anterior tibial surfaces.  Calves nontender, no cords or nodules or varicosities.  Deep tan to all sun-exposed skin. Hands: no edema that I can see.  IMPRESSION AND PLAN:  Peripheral edema I think this is a combo of recent sunburn to LE's + recent excessive sodium intake on vacation in [place]. Very low suspicion of DVT, but I asked her to return if swelling was worsening over the next week or she had onset of calf pain or SOB. I gave lasix 20mg , 1 qd x 2d, RF x 1.  Therapeutic expectations and side effect profile of medication discussed today.  Patient's questions answered.   I recommended low Na diet and elevation of LE's 20-30 min periodically.      FOLLOW UP: prn

## 2012-02-02 ENCOUNTER — Other Ambulatory Visit: Payer: Self-pay | Admitting: *Deleted

## 2012-02-02 MED ORDER — PANTOPRAZOLE SODIUM 40 MG PO TBEC: 40.0000 mg | DELAYED_RELEASE_TABLET | Freq: Every day | ORAL | Status: DC

## 2012-02-02 NOTE — Telephone Encounter (Signed)
Faxed refill request received from pharmacy for PANTOPRAZOLE  Last filled by MD on 11/07/11, #[phone] X [phone] Last seen on 08/08/11 Follow up 02/06/12 RX SENT

## 2012-02-06 ENCOUNTER — Ambulatory Visit (INDEPENDENT_AMBULATORY_CARE_PROVIDER_SITE_OTHER): Payer: 59 | Admitting: Family Medicine

## 2012-02-06 ENCOUNTER — Encounter: Payer: Self-pay | Admitting: Family Medicine

## 2012-02-06 VITALS — BP 128/78 | HR 92 | Temp 98.3°F | Resp 18 | Wt 166.8 lb

## 2012-02-06 DIAGNOSIS — F411 Generalized anxiety disorder: Secondary | ICD-10-CM

## 2012-02-06 DIAGNOSIS — IMO0001 Reserved for inherently not codable concepts without codable children: Secondary | ICD-10-CM

## 2012-02-06 DIAGNOSIS — F419 Anxiety disorder, unspecified: Secondary | ICD-10-CM

## 2012-02-06 DIAGNOSIS — E785 Hyperlipidemia, unspecified: Secondary | ICD-10-CM

## 2012-02-06 DIAGNOSIS — R609 Edema, unspecified: Secondary | ICD-10-CM

## 2012-02-06 DIAGNOSIS — K219 Gastro-esophageal reflux disease without esophagitis: Secondary | ICD-10-CM

## 2012-02-06 DIAGNOSIS — R030 Elevated blood-pressure reading, without diagnosis of hypertension: Secondary | ICD-10-CM

## 2012-02-06 DIAGNOSIS — D649 Anemia, unspecified: Secondary | ICD-10-CM

## 2012-02-06 HISTORY — DX: Reserved for inherently not codable concepts without codable children: IMO0001

## 2012-02-06 MED ORDER — PSYLLIUM 58.6 % PO POWD: 1.0000 | Freq: Three times a day (TID) | ORAL | Status: DC

## 2012-02-06 MED ORDER — PANTOPRAZOLE SODIUM 40 MG PO TBEC: 40.0000 mg | DELAYED_RELEASE_TABLET | Freq: Every day | ORAL | Status: DC

## 2012-02-06 MED ORDER — KRILL OIL PO CAPS: ORAL_CAPSULE | ORAL | Status: DC

## 2012-02-06 NOTE — Assessment & Plan Note (Signed)
Mild consider a mvi every other day and recheck prior to next visit.

## 2012-02-06 NOTE — Assessment & Plan Note (Signed)
Occurred after a vacation and a long plane ride no trouble since

## 2012-02-06 NOTE — Assessment & Plan Note (Signed)
Improved with recheck today, avoid sodium and increase exercise

## 2012-02-06 NOTE — Patient Instructions (Addendum)
Cholesterol Cholesterol is a white, waxy, fat-like protein needed by your body in small amounts. The liver makes all the cholesterol you need. It is carried from the liver by the blood through the blood vessels. Deposits (plaque) may build up on blood vessel walls. This makes the arteries narrower and stiffer. Plaque increases the risk for heart attack and stroke. You cannot feel your cholesterol level even if it is very high. The only way to know is by a blood test to check your lipid (fats) levels. Once you know your cholesterol levels, you should keep a record of the test results. Work with your caregiver to to keep your levels in the desired range. WHAT THE RESULTS MEAN:  Total cholesterol is a rough measure of all the cholesterol in your blood.   LDL is the so-called bad cholesterol. This is the type that deposits cholesterol in the walls of the arteries. You want this level to be low.   HDL is the good cholesterol because it cleans the arteries and carries the LDL away. You want this level to be high.   Triglycerides are fat that the body can either burn for energy or store. High levels are closely linked to heart disease.  DESIRED LEVELS:  Total cholesterol below 200.   LDL below 100 for people at risk, below 70 for very high risk.   HDL above 50 is good, above 60 is best.   Triglycerides below 150.  HOW TO LOWER YOUR CHOLESTEROL:  Diet.   Choose fish or white meat chicken and [place], roasted or baked. Limit fatty cuts of red meat, fried foods, and processed meats, such as sausage and lunch meat.   Eat lots of fresh fruits and vegetables. Choose whole grains, beans, pasta, potatoes and cereals.   Use only small amounts of olive, corn or canola oils. Avoid butter, mayonnaise, shortening or palm kernel oils. Avoid foods with trans-fats.   Use skim/nonfat milk and low-fat/nonfat yogurt and cheeses. Avoid whole milk, cream, ice cream, egg yolks and cheeses. Healthy desserts include  angel food cake, ginger snaps, animal crackers, hard candy, popsicles, and low-fat/nonfat frozen yogurt. Avoid pastries, cakes, pies and cookies.   Exercise.   A regular program helps decrease LDL and raises HDL.   Helps with weight control.   Do things that increase your activity level like gardening, walking, or taking the stairs.   Medication.   May be prescribed by your caregiver to help lowering cholesterol and the risk for heart disease.   You may need medicine even if your levels are normal if you have several risk factors.  HOME CARE INSTRUCTIONS   Follow your diet and exercise programs as suggested by your caregiver.   Take medications as directed.   Have blood work done when your caregiver feels it is necessary.  MAKE SURE YOU:   Understand these instructions.   Will watch your condition.   Will get help right away if you are not doing well or get worse.  Document Released: 04/11/2001 Document Revised: 07/06/2011 Document Reviewed: 10/02/2007 [facility] Patient Information 2012 [place], [place].   MegaRed and Metamucil daily

## 2012-02-06 NOTE — Assessment & Plan Note (Signed)
Avoid offending foods, Ranitidine prn

## 2012-02-06 NOTE — Assessment & Plan Note (Addendum)
She has quit some jobs and feels better would like to come off the sertraline now. Will try a trial off, restart if anxiety worsens

## 2012-02-06 NOTE — Progress Notes (Signed)
Patient ID: Sheryl [name], female   DOB: [birth year], 51 y.o.   MRN: [id number] Sheryl [name] [id number] May 05, 1961 02/06/2012      Progress Note-Follow Up  Subjective  Chief Complaint  Chief Complaint  Patient presents with  . Follow-up    HPI  Patient is a 51 year old Caucasian female who is in today for followup. Overall she feels she's doing well. She has been a very low dose of sertraline for quite some time but is worried it contributing to weight gain and does not feel it is helping. She feels better with regards to her depression and anxiety since quitting several jobs. She like to stop the medication see her she does. She has started to exercise and is trying to eat better. She had some issues with peripheral edema after long plane ride back from [place] but has not had anything recurs . No other recent illness, fevers, chills, chest pain, palpitations, headache, GI or GU complaints and she was last seen  Past Medical History  Diagnosis Date  . Chicken pox as a child  . Anxiety   . GERD (gastroesophageal reflux disease)   . Depression   . Overweight   . Anemia   . Preventative health care 06/07/2011  . FH: colon cancer 06/07/2011  . Elevated BP 02/06/2012    Past Surgical History  Procedure Date  . Gum surgery 2007  . Right hand surgery 2009    infection debrided  . Cesarean section 1985 and 1989    X 2  . Colonoscopy     Family History  Problem Relation Age of Onset  . Heart attack Mother   . Diabetes Mother     type 2  . Hypertension Mother   . Hyperlipidemia Mother   . Cancer Father     colon  . Heart attack Father   . Colon cancer Father   . Hypertension Sister   . Diabetes Sister     type 2  . Hypertension Sister   . Diabetes Maternal Grandmother   . Hypertension Maternal Grandmother   . Hyperlipidemia Maternal Grandmother     History   Social History  . Marital Status: Married    Spouse Name: N/A    Number of Children: N/A  . Years of  Education: N/A   Occupational History  . Not on file.   Social History Main Topics  . Smoking status: Never Smoker   . Smokeless tobacco: Never Used  . Alcohol Use: 7.0 oz/week    14 drink(s) per week     glass of wine nightly  . Drug Use: No  . Sexually Active: Yes -- Female partner(s)   Other Topics Concern  . Not on file   Social History Narrative  . No narrative on file    Current Outpatient Prescriptions on File Prior to Visit  Medication Sig Dispense Refill  . ALPRAZolam (XANAX) 0.25 MG tablet Take 0.25 mg by mouth at bedtime.       . bifidobacterium infantis (ALIGN) capsule Take 1 capsule by mouth daily.      . furosemide (LASIX) 20 MG tablet 1 tab po qd x 2d  2 tablet  1  . Multiple Vitamin (MULTIVITAMIN) capsule Take 1 capsule by mouth daily.        . sodium chloride (AYR) 0.65 % nasal spray Place 1 spray into the nose as needed for congestion.  30 mL  12  . zolpidem (AMBIEN) 10 MG tablet Take 10 mg by  mouth at bedtime as needed.        Marland Kitchen DISCONTD: pantoprazole (PROTONIX) 40 MG tablet Take 1 tablet (40 mg total) by mouth daily.  90 tablet  3  . DISCONTD: FLUoxetine (PROZAC) 20 MG capsule Take 20 mg by mouth daily.          Allergies  Allergen Reactions  . Penicillins Rash    Review of Systems  Review of Systems  Constitutional: Negative for fever and malaise/fatigue.  HENT: Negative for congestion.   Eyes: Negative for discharge.  Respiratory: Negative for shortness of breath.   Cardiovascular: Negative for chest pain, palpitations and leg swelling.  Gastrointestinal: Negative for nausea, abdominal pain and diarrhea.  Genitourinary: Negative for dysuria.  Musculoskeletal: Negative for falls.  Skin: Negative for rash.  Neurological: Negative for loss of consciousness and headaches.  Endo/Heme/Allergies: Negative for polydipsia.  Psychiatric/Behavioral: Negative for depression and suicidal ideas. The patient is not nervous/anxious and does not have insomnia.       Objective  BP 128/78  Pulse 92  Temp 98.3 F (36.8 C) (Oral)  Resp 18  Wt 166 lb 12.8 oz (75.66 kg)  SpO2 97%  Physical Exam  Physical Exam  Constitutional: She is oriented to person, place, and time and well-developed, well-nourished, and in no distress. No distress.  HENT:  Head: Normocephalic and atraumatic.  Eyes: Conjunctivae are normal.  Neck: Neck supple. No thyromegaly present.  Cardiovascular: Normal rate, regular rhythm and normal heart sounds.   No murmur heard. Pulmonary/Chest: Effort normal and breath sounds normal. She has no wheezes.  Abdominal: She exhibits no distension and no mass.  Musculoskeletal: She exhibits no edema.  Lymphadenopathy:    She has no cervical adenopathy.  Neurological: She is alert and oriented to person, place, and time.  Skin: Skin is warm and dry. No rash noted. She is not diaphoretic.  Psychiatric: Memory, affect and judgment normal.    Lab Results  Component Value Date   TSH 1.880 08/08/2011   Lab Results  Component Value Date   WBC 5.0 08/08/2011   HGB 13.5 08/08/2011   HCT 41.8 08/08/2011   MCV 88.4 08/08/2011   PLT 231 08/08/2011   Lab Results  Component Value Date   CREATININE 0.91 08/08/2011   BUN 16 08/08/2011   NA 138 08/08/2011   K 4.3 08/08/2011   CL 103 08/08/2011   CO2 21 08/08/2011   Lab Results  Component Value Date   ALT 20 08/08/2011   AST 26 08/08/2011   ALKPHOS 92 08/08/2011   BILITOT 0.5 08/08/2011   Lab Results  Component Value Date   CHOL 261* 08/08/2011   Lab Results  Component Value Date   HDL 118 08/08/2011   Lab Results  Component Value Date   LDLCALC 122* 08/08/2011   Lab Results  Component Value Date   TRIG 104 08/08/2011   Lab Results  Component Value Date   CHOLHDL 2.2 08/08/2011     Assessment & Plan  Elevated BP Improved with recheck today, avoid sodium and increase exercise  Peripheral edema Occurred after a vacation and a long plane ride no trouble since  Anemia Mild consider a mvi every  other day and recheck prior to next visit.  Anxiety She has quit some jobs and feels better would like to come off the sertraline now. Will try a trial off, restart if anxiety worsens  GERD (gastroesophageal reflux disease) Avoid offending foods, Ranitidine prn

## 2012-05-01 ENCOUNTER — Other Ambulatory Visit (INDEPENDENT_AMBULATORY_CARE_PROVIDER_SITE_OTHER): Payer: 59

## 2012-05-01 DIAGNOSIS — E785 Hyperlipidemia, unspecified: Secondary | ICD-10-CM

## 2012-05-01 DIAGNOSIS — R030 Elevated blood-pressure reading, without diagnosis of hypertension: Secondary | ICD-10-CM

## 2012-05-01 LAB — HEPATIC FUNCTION PANEL
ALT: 22 U/L (ref 0–35)
AST: 26 U/L (ref 0–37)
Albumin: 3.8 g/dL (ref 3.5–5.2)
Total Bilirubin: 0.7 mg/dL (ref 0.3–1.2)

## 2012-05-01 LAB — CBC
HCT: 44.2 % (ref 36.0–46.0)
Hemoglobin: 14.6 g/dL (ref 12.0–15.0)
MCHC: 33.1 g/dL (ref 30.0–36.0)
MCV: 90.1 fl (ref 78.0–100.0)
Platelets: 179 10*3/uL (ref 150.0–400.0)
RBC: 4.91 Mil/uL (ref 3.87–5.11)

## 2012-05-01 LAB — LIPID PANEL
Cholesterol: 267 mg/dL — ABNORMAL HIGH (ref 0–200)
Triglycerides: 137 mg/dL (ref 0.0–149.0)

## 2012-05-01 LAB — RENAL FUNCTION PANEL
Calcium: 10.5 mg/dL (ref 8.4–10.5)
Creatinine, Ser: 0.9 mg/dL (ref 0.4–1.2)
GFR: 71.64 mL/min (ref >60.00)
Glucose, Bld: 89 mg/dL (ref 70–99)
Potassium: 5 mEq/L (ref 3.5–5.1)
Sodium: 139 mEq/L (ref 135–145)

## 2012-05-08 ENCOUNTER — Ambulatory Visit (INDEPENDENT_AMBULATORY_CARE_PROVIDER_SITE_OTHER)
Admission: RE | Admit: 2012-05-08 | Discharge: 2012-05-08 | Disposition: A | Discharge status: Home / Self Care | Payer: 59 | Source: Ambulatory Visit | Attending: Family Medicine | Admitting: Family Medicine

## 2012-05-08 ENCOUNTER — Encounter: Payer: Self-pay | Admitting: Family Medicine

## 2012-05-08 ENCOUNTER — Ambulatory Visit (INDEPENDENT_AMBULATORY_CARE_PROVIDER_SITE_OTHER): Payer: 59 | Admitting: Family Medicine

## 2012-05-08 VITALS — BP 124/86 | HR 77 | Temp 97.2°F | Ht 61.0 in | Wt 169.8 lb

## 2012-05-08 DIAGNOSIS — E785 Hyperlipidemia, unspecified: Secondary | ICD-10-CM

## 2012-05-08 DIAGNOSIS — R030 Elevated blood-pressure reading, without diagnosis of hypertension: Secondary | ICD-10-CM

## 2012-05-08 DIAGNOSIS — D649 Anemia, unspecified: Secondary | ICD-10-CM

## 2012-05-08 DIAGNOSIS — F419 Anxiety disorder, unspecified: Secondary | ICD-10-CM

## 2012-05-08 DIAGNOSIS — IMO0001 Reserved for inherently not codable concepts without codable children: Secondary | ICD-10-CM

## 2012-05-08 DIAGNOSIS — E663 Overweight: Secondary | ICD-10-CM

## 2012-05-08 DIAGNOSIS — M533 Sacrococcygeal disorders, not elsewhere classified: Secondary | ICD-10-CM

## 2012-05-08 DIAGNOSIS — Z800 Family history of malignant neoplasm of digestive organs: Secondary | ICD-10-CM

## 2012-05-08 DIAGNOSIS — F411 Generalized anxiety disorder: Secondary | ICD-10-CM

## 2012-05-08 DIAGNOSIS — K219 Gastro-esophageal reflux disease without esophagitis: Secondary | ICD-10-CM

## 2012-05-08 DIAGNOSIS — E669 Obesity, unspecified: Secondary | ICD-10-CM

## 2012-05-08 HISTORY — DX: Hyperlipidemia, unspecified: E78.5

## 2012-05-08 HISTORY — DX: Sacrococcygeal disorders, not elsewhere classified: M53.3

## 2012-05-08 MED ORDER — PHENTERMINE HCL 15 MG PO CAPS: 15.0000 mg | ORAL_CAPSULE | ORAL | Status: DC

## 2012-05-08 NOTE — Assessment & Plan Note (Signed)
Doing well at this time, off of Zoloft warned that she might see an increase with the Phentermine

## 2012-05-08 NOTE — Assessment & Plan Note (Signed)
About one month, try Aspercreme prn and check an xray

## 2012-05-08 NOTE — Assessment & Plan Note (Signed)
Her hdl remains very hi, she is encouraged to increase exercise, continue Krill oil and avoid trans fats and we will continue to monitor

## 2012-05-08 NOTE — Assessment & Plan Note (Signed)
Well controlled on Omeprazole, no changes at this time, avoid offending foods

## 2012-05-08 NOTE — Assessment & Plan Note (Signed)
Resolved with recent blood draw 

## 2012-05-08 NOTE — Assessment & Plan Note (Signed)
Well controlled no changes 

## 2012-05-08 NOTE — Progress Notes (Signed)
Patient ID: Sheryl [name], female   DOB: July 31, 1961, 51 y.o.   MRN: [id number] Sheryl [name] [id number] [birth year] 05/08/2012      Progress Note-Follow Up  Subjective  Chief Complaint  Chief Complaint  Patient presents with  . Follow-up    3 month    HPI  Patient is a 51 year old Caucasian female who is in today for followup. She feels well. She's had no flare in her heartburn. She feels well. No chest pain or palpitation, recent illness, fevers, chest pain, shortness of breath, GI or GU complaints noted today. Mostly she is frustrated with weight gain. I she is overtly not exercising or following the DASH diet as we discussed at her last visit here. She c/o sacral pain for the last month or so. She did have a fall on the sacrum about 6 months ago. No incontinence or radicular symptoms Past Medical History  Diagnosis Date  . Chicken pox as a child  . Anxiety   . GERD (gastroesophageal reflux disease)   . Depression   . Overweight   . Anemia   . Preventative health care 06/07/2011  . FH: colon cancer 06/07/2011  . Elevated BP 02/06/2012  . Hyperlipidemia 05/08/2012    Past Surgical History  Procedure Date  . Gum surgery 2007  . Right hand surgery 2009    infection debrided  . Cesarean section 1985 and 1989    X 2  . Colonoscopy     Family History  Problem Relation Age of Onset  . Heart attack Mother   . Diabetes Mother     type 2  . Hypertension Mother   . Hyperlipidemia Mother   . Cancer Father     colon  . Heart attack Father   . Colon cancer Father   . Hypertension Sister   . Diabetes Sister     type 2  . Hypertension Sister   . Diabetes Maternal Grandmother   . Hypertension Maternal Grandmother   . Hyperlipidemia Maternal Grandmother     History   Social History  . Marital Status: Married    Spouse Name: N/A    Number of Children: N/A  . Years of Education: N/A   Occupational History  . Not on file.   Social History Main Topics  . Smoking  status: Never Smoker   . Smokeless tobacco: Never Used  . Alcohol Use: 7.0 oz/week    14 drink(s) per week     glass of wine nightly  . Drug Use: No  . Sexually Active: Yes -- Female partner(s)   Other Topics Concern  . Not on file   Social History Narrative  . No narrative on file    Current Outpatient Prescriptions on File Prior to Visit  Medication Sig Dispense Refill  . ALPRAZolam (XANAX) 0.25 MG tablet Take 0.25 mg by mouth at bedtime.       [facility] CAPS 1 cap daily MegaRed by Schiff      . Multiple Vitamin (MULTIVITAMIN) capsule Take 1 capsule by mouth daily.        . pantoprazole (PROTONIX) 40 MG tablet Take 1 tablet (40 mg total) by mouth daily.  90 tablet  3  . sodium chloride (AYR) 0.65 % nasal spray Place 1 spray into the nose as needed for congestion.  30 mL  12  . zolpidem (AMBIEN) 10 MG tablet Take 10 mg by mouth at bedtime as needed.        . phentermine  15 MG capsule Take 1 capsule (15 mg total) by mouth every morning.  30 capsule  0  . DISCONTD: FLUoxetine (PROZAC) 20 MG capsule Take 20 mg by mouth daily.          Allergies  Allergen Reactions  . Penicillins Rash    Review of Systems  Review of Systems  Constitutional: Negative for fever and malaise/fatigue.  HENT: Negative for congestion.   Eyes: Negative for discharge.  Respiratory: Negative for shortness of breath.   Cardiovascular: Negative for chest pain, palpitations and leg swelling.  Gastrointestinal: Negative for nausea, abdominal pain and diarrhea.  Genitourinary: Negative for dysuria.  Musculoskeletal: Negative for falls.  Skin: Negative for rash.  Neurological: Negative for loss of consciousness and headaches.  Endo/Heme/Allergies: Negative for polydipsia.  Psychiatric/Behavioral: Negative for depression and suicidal ideas. The patient is not nervous/anxious and does not have insomnia.     Objective  BP 124/86  Pulse 77  Temp 97.2 F (36.2 C) (Temporal)  Ht 5\' 1"  (1.549 m)  Wt  169 lb 12.8 oz (77.021 kg)  BMI 32.08 kg/m2  SpO2 99%  Physical Exam  Physical Exam  Constitutional: She is oriented to person, place, and time and well-developed, well-nourished, and in no distress. No distress.  HENT:  Head: Normocephalic and atraumatic.  Eyes: Conjunctivae normal are normal.  Neck: Neck supple. No thyromegaly present.  Cardiovascular: Normal rate, regular rhythm and normal heart sounds.   No murmur heard. Pulmonary/Chest: Effort normal and breath sounds normal. She has no wheezes.  Abdominal: She exhibits no distension and no mass.  Musculoskeletal: She exhibits no edema.  Lymphadenopathy:    She has no cervical adenopathy.  Neurological: She is alert and oriented to person, place, and time.  Skin: Skin is warm and dry. No rash noted. She is not diaphoretic.  Psychiatric: Memory, affect and judgment normal.    Lab Results  Component Value Date   TSH 2.68 05/01/2012   Lab Results  Component Value Date   WBC 5.3 05/01/2012   HGB 14.6 05/01/2012   HCT 44.2 05/01/2012   MCV 90.1 05/01/2012   PLT 179.0 05/01/2012   Lab Results  Component Value Date   CREATININE 0.9 05/01/2012   BUN 22 05/01/2012   NA 139 05/01/2012   K 5.0 05/01/2012   CL 107 05/01/2012   CO2 26 05/01/2012   Lab Results  Component Value Date   ALT 22 05/01/2012   AST 26 05/01/2012   ALKPHOS 86 05/01/2012   BILITOT 0.7 05/01/2012   Lab Results  Component Value Date   CHOL 267* 05/01/2012   Lab Results  Component Value Date   HDL 124.00 05/01/2012   Lab Results  Component Value Date   LDLCALC 122* 08/08/2011   Lab Results  Component Value Date   TRIG 137.0 05/01/2012   Lab Results  Component Value Date   CHOLHDL 2 05/01/2012     Assessment & Plan  FH: colon cancer No c/o bowel changes today  GERD (gastroesophageal reflux disease) Well controlled on Omeprazole, no changes at this time, avoid offending foods  Overweight Consider DASH diet, increase exercise and given an rx for  Phentermine 15 mg daily, recheck vitals in 1 month, warned regarding side effects  Elevated BP Well controlled no changes  Anxiety Doing well at this time, off of Zoloft warned that she might see an increase with the Phentermine  Anemia Resolved with recent blood draw  Hyperlipidemia Her hdl remains very hi, she is  encouraged to increase exercise, continue Krill oil and avoid trans fats and we will continue to monitor  Sacral pain About one month, try Aspercreme prn and check an xray

## 2012-05-08 NOTE — Progress Notes (Signed)
Quick Note:  Patient Informed and voiced understanding ______ 

## 2012-05-08 NOTE — Patient Instructions (Addendum)
Tailbone Injury  The tailbone (coccyx) is the small bone at the lower end of the spine. A tailbone injury may involve stretched ligaments, bruising, or a broken bone (fracture). Women are more vulnerable to this injury due to having a wider pelvis.  CAUSES   This type of injury typically occurs from falling and landing on the tailbone. Repeated strain or friction from actions such as rowing and bicycling may also injure the area. The tailbone can be injured during childbirth. Infections or tumors may also press on the tailbone and cause pain. Sometimes, the cause of injury is unknown.  SYMPTOMS    Bruising.   Pain when sitting.   Painful bowel movements.   In women, pain during intercourse.  DIAGNOSIS   Your caregiver can diagnose a tailbone injury based on your symptoms and a physical exam. X-rays may be taken if a fracture is suspected. Your caregiver may also use an MRI scan imaging test to evaluate your symptoms.  TREATMENT   Your caregiver may prescribe medicines to help relieve your pain. Most tailbone injuries heal on their own in 4 to 6 weeks. However, if the injury is caused by an infection or tumor, the recovery period may vary.  PREVENTION   Wear appropriate padding and sports gear when bicycling and rowing. This can help prevent an injury from repeated strain or friction.  HOME CARE INSTRUCTIONS    Put ice on the injured area.   Put ice in a plastic bag.   Place a towel between your skin and the bag.   Leave the ice on for 15 to 20 minutes, every hour while awake for the first 1 to 2 days.   Sit on a large, rubber or inflated ring or cushion to ease your pain. Lean forward when sitting to help decrease discomfort.   Avoid sitting for long periods of time.   Increase your activity as the pain allows.   Only take over-the-counter or prescription medicines for pain, discomfort, or fever as directed by your caregiver.   You may use stool softeners if it is painful to have a bowel movement, or as  directed by your caregiver.   Eat a diet with plenty of fiber to help prevent constipation.   Keep all follow-up appointments as directed by your caregiver.  SEEK MEDICAL CARE IF:    Your pain becomes worse.   Your bowel movements cause a great deal of discomfort.   You are unable to have a bowel movement.   You have a fever.  MAKE SURE YOU:   Understand these instructions.   Will watch your condition.   Will get help right away if you are not doing well or get worse.  Document Released: 07/14/2000 Document Revised: 10/09/2011 Document Reviewed: 02/09/2011  [employer] Patient Information 2013 [employer].

## 2012-05-08 NOTE — Assessment & Plan Note (Signed)
No c/o bowel changes today

## 2012-05-08 NOTE — Assessment & Plan Note (Signed)
Consider DASH diet, increase exercise and given an rx for Phentermine 15 mg daily, recheck vitals in 1 month, warned regarding side effects

## 2012-06-06 ENCOUNTER — Encounter: Payer: Self-pay | Admitting: Family Medicine

## 2012-06-06 ENCOUNTER — Ambulatory Visit (INDEPENDENT_AMBULATORY_CARE_PROVIDER_SITE_OTHER): Payer: 59 | Admitting: Family Medicine

## 2012-06-06 VITALS — BP 138/99 | HR 87 | Temp 97.4°F | Ht 61.0 in | Wt 165.8 lb

## 2012-06-06 DIAGNOSIS — F419 Anxiety disorder, unspecified: Secondary | ICD-10-CM

## 2012-06-06 DIAGNOSIS — E669 Obesity, unspecified: Secondary | ICD-10-CM

## 2012-06-06 DIAGNOSIS — E663 Overweight: Secondary | ICD-10-CM

## 2012-06-06 DIAGNOSIS — R030 Elevated blood-pressure reading, without diagnosis of hypertension: Secondary | ICD-10-CM

## 2012-06-06 DIAGNOSIS — F411 Generalized anxiety disorder: Secondary | ICD-10-CM

## 2012-06-06 DIAGNOSIS — IMO0001 Reserved for inherently not codable concepts without codable children: Secondary | ICD-10-CM

## 2012-06-06 MED ORDER — PHENTERMINE HCL 15 MG PO CAPS: 15.0000 mg | ORAL_CAPSULE | ORAL | Status: DC

## 2012-06-06 NOTE — Patient Instructions (Addendum)

## 2012-06-06 NOTE — Assessment & Plan Note (Signed)
Elevated today again, will have her minimize sodium and caffeine and recheck at next visit.

## 2012-06-06 NOTE — Progress Notes (Signed)
Patient ID: Sheryl [name], female   DOB: May 17, 1961, 51 y.o.   MRN: [id number] GIZZELLE CREDIT [id number] [birth year] 06/06/2012      Progress Note-Follow Up  Subjective  Chief Complaint  Chief Complaint  Patient presents with  . Follow-up    4 week    HPI  Patient is a 51 year old Caucasian female who is in today for followup on blood pressure and phentermine. She reports she tolerates the phentermine well. She denies headaches, worsening anxiety, insomnia. No chest pain palpitations, GI complaints or other concerns. She does note for the first several days if you should have a suppression of her appetite and her appetite returned. She is interested in increasing dosing if possible. No other acute complaints such as flair and reflux or concerns are noted.  Past Medical History  Diagnosis Date  . Chicken pox as a child  . Anxiety   . GERD (gastroesophageal reflux disease)   . Depression   . Overweight   . Anemia   . Preventative health care 06/07/2011  . FH: colon cancer 06/07/2011  . Elevated BP 02/06/2012  . Hyperlipidemia 05/08/2012  . Sacral pain 05/08/2012    Past Surgical History  Procedure Date  . Gum surgery 2007  . Right hand surgery 2009    infection debrided  . Cesarean section 1985 and 1989    X 2  . Colonoscopy     Family History  Problem Relation Age of Onset  . Heart attack Mother   . Diabetes Mother     type 2  . Hypertension Mother   . Hyperlipidemia Mother   . Cancer Father     colon  . Heart attack Father   . Colon cancer Father   . Hypertension Sister   . Diabetes Sister     type 2  . Hypertension Sister   . Diabetes Maternal Grandmother   . Hypertension Maternal Grandmother   . Hyperlipidemia Maternal Grandmother     History   Social History  . Marital Status: Married    Spouse Name: N/A    Number of Children: N/A  . Years of Education: N/A   Occupational History  . Not on file.   Social History Main Topics  . Smoking  status: Never Smoker   . Smokeless tobacco: Never Used  . Alcohol Use: 7.0 oz/week    14 drink(s) per week     Comment: glass of wine nightly  . Drug Use: No  . Sexually Active: Yes -- Female partner(s)   Other Topics Concern  . Not on file   Social History Narrative  . No narrative on file    Current Outpatient Prescriptions on File Prior to Visit  Medication Sig Dispense Refill  . ALPRAZolam (XANAX) 0.25 MG tablet Take 0.25 mg by mouth at bedtime.       [facility] CAPS 1 cap daily MegaRed by Schiff      . Multiple Vitamin (MULTIVITAMIN) capsule Take 1 capsule by mouth daily.        . pantoprazole (PROTONIX) 40 MG tablet Take 1 tablet (40 mg total) by mouth daily.  90 tablet  3  . sodium chloride (AYR) 0.65 % nasal spray Place 1 spray into the nose as needed for congestion.  30 mL  12  . [DISCONTINUED] phentermine 15 MG capsule Take 1 capsule (15 mg total) by mouth every morning.  30 capsule  0  . [DISCONTINUED] FLUoxetine (PROZAC) 20 MG capsule Take 20 mg by  mouth daily.          Allergies  Allergen Reactions  . Penicillins Rash    Review of Systems  Review of Systems  Constitutional: Negative for fever and malaise/fatigue.  HENT: Negative for congestion.   Eyes: Negative for discharge.  Respiratory: Negative for shortness of breath.   Cardiovascular: Negative for chest pain, palpitations and leg swelling.  Gastrointestinal: Negative for nausea, abdominal pain and diarrhea.  Genitourinary: Negative for dysuria.  Musculoskeletal: Negative for falls.  Skin: Negative for rash.  Neurological: Negative for loss of consciousness and headaches.  Endo/Heme/Allergies: Negative for polydipsia.  Psychiatric/Behavioral: Negative for depression and suicidal ideas. The patient is not nervous/anxious and does not have insomnia.     Objective  BP 138/99  Pulse 87  Temp 97.4 F (36.3 C) (Temporal)  Ht 5\' 1"  (1.549 m)  Wt 165 lb 12.8 oz (75.206 kg)  BMI 31.33 kg/m2  SpO2  100%  Physical Exam  Physical Exam  Constitutional: She is oriented to person, place, and time and well-developed, well-nourished, and in no distress. No distress.  HENT:  Head: Normocephalic and atraumatic.  Eyes: Conjunctivae normal are normal.  Neck: Neck supple. No thyromegaly present.  Cardiovascular: Normal rate, regular rhythm and normal heart sounds.   No murmur heard. Pulmonary/Chest: Effort normal and breath sounds normal. She has no wheezes.  Abdominal: She exhibits no distension and no mass.  Musculoskeletal: She exhibits no edema.  Lymphadenopathy:    She has no cervical adenopathy.  Neurological: She is alert and oriented to person, place, and time.  Skin: Skin is warm and dry. No rash noted. She is not diaphoretic.  Psychiatric: Memory, affect and judgment normal.    Lab Results  Component Value Date   TSH 2.68 05/01/2012   Lab Results  Component Value Date   WBC 5.3 05/01/2012   HGB 14.6 05/01/2012   HCT 44.2 05/01/2012   MCV 90.1 05/01/2012   PLT 179.0 05/01/2012   Lab Results  Component Value Date   CREATININE 0.9 05/01/2012   BUN 22 05/01/2012   NA 139 05/01/2012   K 5.0 05/01/2012   CL 107 05/01/2012   CO2 26 05/01/2012   Lab Results  Component Value Date   ALT 22 05/01/2012   AST 26 05/01/2012   ALKPHOS 86 05/01/2012   BILITOT 0.7 05/01/2012   Lab Results  Component Value Date   CHOL 267* 05/01/2012   Lab Results  Component Value Date   HDL 124.00 05/01/2012   Lab Results  Component Value Date   LDLCALC 122* 08/08/2011   Lab Results  Component Value Date   TRIG 137.0 05/01/2012   Lab Results  Component Value Date   CHOLHDL 2 05/01/2012     Assessment & Plan  Anxiety Did not have any worsening and did not need any Alprazolam this past month  Overweight Tolerating Phentermine, did feel it helped to suppress her appetite the first week and then did n ot work as well. Would like to increase but her bp is too hi, we will continue the 15 mg dose  for now and then recheck bp in 6 weeks or sooner as needed. Avoid sodium and caffeine  Elevated BP Elevated today again, will have her minimize sodium and caffeine and recheck at next visit.

## 2012-06-06 NOTE — Assessment & Plan Note (Signed)
Did not have any worsening and did not need any Alprazolam this past month

## 2012-06-06 NOTE — Assessment & Plan Note (Signed)
Tolerating Phentermine, did feel it helped to suppress her appetite the first week and then did n ot work as well. Would like to increase but her bp is too hi, we will continue the 15 mg dose for now and then recheck bp in 6 weeks or sooner as needed. Avoid sodium and caffeine

## 2012-06-24 ENCOUNTER — Other Ambulatory Visit: Payer: Self-pay

## 2012-06-24 MED ORDER — ALPRAZOLAM 0.25 MG PO TABS: 0.2500 mg | ORAL_TABLET | Freq: Every day | ORAL | Status: DC

## 2012-06-24 NOTE — Telephone Encounter (Signed)
Please advise Alprazolam refill?  If ok send to [phone]

## 2012-06-24 NOTE — Telephone Encounter (Signed)
Faxed to pharmacy

## 2012-07-18 ENCOUNTER — Ambulatory Visit (INDEPENDENT_AMBULATORY_CARE_PROVIDER_SITE_OTHER): Payer: 59 | Admitting: Family Medicine

## 2012-07-18 ENCOUNTER — Encounter: Payer: Self-pay | Admitting: Family Medicine

## 2012-07-18 VITALS — BP 118/78 | HR 77 | Temp 98.4°F | Ht 61.0 in | Wt 167.0 lb

## 2012-07-18 DIAGNOSIS — E785 Hyperlipidemia, unspecified: Secondary | ICD-10-CM

## 2012-07-18 DIAGNOSIS — R030 Elevated blood-pressure reading, without diagnosis of hypertension: Secondary | ICD-10-CM

## 2012-07-18 DIAGNOSIS — IMO0001 Reserved for inherently not codable concepts without codable children: Secondary | ICD-10-CM

## 2012-07-18 DIAGNOSIS — E663 Overweight: Secondary | ICD-10-CM

## 2012-07-18 DIAGNOSIS — F419 Anxiety disorder, unspecified: Secondary | ICD-10-CM

## 2012-07-18 DIAGNOSIS — E669 Obesity, unspecified: Secondary | ICD-10-CM

## 2012-07-18 DIAGNOSIS — F411 Generalized anxiety disorder: Secondary | ICD-10-CM

## 2012-07-18 MED ORDER — PHENTERMINE HCL 15 MG PO CAPS: 15.0000 mg | ORAL_CAPSULE | ORAL | Status: DC

## 2012-07-18 MED ORDER — ALPRAZOLAM 0.25 MG PO TABS: 0.2500 mg | ORAL_TABLET | Freq: Every day | ORAL | Status: DC

## 2012-07-18 NOTE — Assessment & Plan Note (Signed)
Continue Krill oil and exercise and receh

## 2012-07-18 NOTE — Patient Instructions (Addendum)
Calorie Counting Diet A calorie counting diet requires you to eat the number of calories that are right for you in a day. Calories are the measurement of how much energy you get from the food you eat. Eating the right amount of calories is important for staying at a healthy weight. If you eat too many calories, your body will store them as fat and you may gain weight. If you eat too few calories, you may lose weight. Counting the number of calories you eat during a day will help you know if you are eating the right amount. A Registered Dietitian can determine how many calories you need in a day. The amount of calories needed varies from person to person. If your goal is to lose weight, you will need to eat fewer calories. Losing weight can benefit you if you are overweight or have health problems such as heart disease, high blood pressure, or diabetes. If your goal is to gain weight, you will need to eat more calories. Gaining weight may be necessary if you have a certain health problem that causes your body to need more energy. TIPS Whether you are increasing or decreasing the number of calories you eat during a day, it may be hard to get used to changes in what you eat and drink. The following are tips to help you keep track of the number of calories you eat.  Measure foods at home with measuring cups. This helps you know the amount of food and number of calories you are eating.  Restaurants often serve food in amounts that are larger than 1 serving. While eating out, estimate how many servings of a food you are given. For example, a serving of cooked rice is  cup or about the size of half of a fist. Knowing serving sizes will help you be aware of how much food you are eating at restaurants.  Ask for smaller portion sizes or child-size portions at restaurants.  Plan to eat half of a meal at a restaurant. Take the rest home or share the other half with a friend.  Read the Nutrition Facts panel on  food labels for calorie content and serving size. You can find out how many servings are in a package, the size of a serving, and the number of calories each serving has.  For example, a package might contain 3 cookies. The Nutrition Facts panel on that package says that 1 serving is 1 cookie. Below that, it will say there are 3 servings in the container. The calories section of the Nutrition Facts label says there are 90 calories. This means there are 90 calories in 1 cookie (1 serving). If you eat 1 cookie you have eaten 90 calories. If you eat all 3 cookies, you have eaten 270 calories (3 servings x 90 calories = 270 calories). The list below tells you how big or small some common portion sizes are.  1 oz.........4 stacked dice.  3 oz.........Deck of cards.  1 tsp........Tip of little finger.  1 tbs........Thumb.  2 tbs........Golf ball.   cup.......Half of a fist.  1 cup........A fist. KEEP A FOOD LOG Write down every food item you eat, the amount you eat, and the number of calories in each food you eat during the day. At the end of the day, you can add up the total number of calories you have eaten. It may help to keep a list like the one below. Find out the calorie information by reading the   Nutrition Facts panel on food labels. Breakfast  Bran cereal (1 cup, 110 calories).  Fat-free milk ( cup, 45 calories). Snack  Apple (1 medium, 80 calories). Lunch  Spinach (1 cup, 20 calories).  Tomato ( medium, 20 calories).  Chicken breast strips (3 oz, 165 calories).  Shredded cheddar cheese ( cup, 110 calories).  Light Italian dressing (2 tbs, 60 calories).  Whole-wheat bread (1 slice, 80 calories).  Tub margarine (1 tsp, 35 calories).  Vegetable soup (1 cup, 160 calories). Dinner  Pork chop (3 oz, 190 calories).  Brown rice (1 cup, 215 calories).  Steamed broccoli ( cup, 20 calories).  Strawberries (1  cup, 65 calories).  Whipped cream (1 tbs, 50  calories). Daily Calorie Total: 1425 Document Released: 07/17/2005 Document Revised: 10/09/2011 Document Reviewed: 01/11/2007 [employer] Patient Information 2013 [employer].  

## 2012-07-18 NOTE — Progress Notes (Signed)
Patient ID: Sheryl [name], female   DOB: [birth year], 51 y.o.   MRN: [id number] TYLEE YUM [id number] 06/11/1961 07/18/2012      Progress Note-Follow Up  Subjective  Chief Complaint  Chief Complaint  Patient presents with  . Follow-up    6 week    HPI  This is a 51 year old Caucasian female who is in today for followup. She has been doing well since her last visit. She did choose not to go on phentermine and instead has stayed off it. She does realize since coming off of it it was helping suppress her appetite and is interested in going back on. She never had any concerning side effects other than her blood pressure being up at the last visit but that has happened in the past with the medication. She denies headache, insomnia, increased anxiety chest pain no palpitation, shortness of breath, GI or GU concerns either now or prior to stopping the phentermine. No other acute complaints or illnesses noted  Past Medical History  Diagnosis Date  . Chicken pox as a child  . Anxiety   . GERD (gastroesophageal reflux disease)   . Depression   . Overweight   . Anemia   . Preventative health care 06/07/2011  . FH: colon cancer 06/07/2011  . Elevated BP 02/06/2012  . Hyperlipidemia 05/08/2012  . Sacral pain 05/08/2012    Past Surgical History  Procedure Date  . Gum surgery 2007  . Right hand surgery 2009    infection debrided  . Cesarean section 1985 and 1989    X 2  . Colonoscopy     Family History  Problem Relation Age of Onset  . Heart attack Mother   . Diabetes Mother     type 2  . Hypertension Mother   . Hyperlipidemia Mother   . Cancer Father     colon  . Heart attack Father   . Colon cancer Father   . Hypertension Sister   . Diabetes Sister     type 2  . Hypertension Sister   . Diabetes Maternal Grandmother   . Hypertension Maternal Grandmother   . Hyperlipidemia Maternal Grandmother     History   Social History  . Marital Status: Married    Spouse  Name: N/A    Number of Children: N/A  . Years of Education: N/A   Occupational History  . Not on file.   Social History Main Topics  . Smoking status: Never Smoker   . Smokeless tobacco: Never Used  . Alcohol Use: 7.0 oz/week    14 drink(s) per week     Comment: glass of wine nightly  . Drug Use: No  . Sexually Active: Yes -- Female partner(s)   Other Topics Concern  . Not on file   Social History Narrative  . No narrative on file    Current Outpatient Prescriptions on File Prior to Visit  Medication Sig Dispense Refill  . Krill Oil CAPS 1 cap daily MegaRed by Schiff      . Multiple Vitamin (MULTIVITAMIN) capsule Take 1 capsule by mouth daily.        . pantoprazole (PROTONIX) 40 MG tablet Take 1 tablet (40 mg total) by mouth daily.  90 tablet  3  . [DISCONTINUED] FLUoxetine (PROZAC) 20 MG capsule Take 20 mg by mouth daily.          Allergies  Allergen Reactions  . Penicillins Rash    Review of Systems  Review of Systems  Constitutional: Negative for fever and malaise/fatigue.  HENT: Negative for congestion.   Eyes: Negative for discharge.  Respiratory: Negative for shortness of breath.   Cardiovascular: Negative for chest pain, palpitations and leg swelling.  Gastrointestinal: Negative for nausea, abdominal pain and diarrhea.  Genitourinary: Negative for dysuria.  Musculoskeletal: Negative for falls.  Skin: Negative for rash.  Neurological: Negative for loss of consciousness and headaches.  Endo/Heme/Allergies: Negative for polydipsia.  Psychiatric/Behavioral: Negative for depression and suicidal ideas. The patient is not nervous/anxious and does not have insomnia.     Objective  BP 118/78  Pulse 77  Temp 98.4 F (36.9 C) (Temporal)  Ht 5\' 1"  (1.549 m)  Wt 167 lb (75.751 kg)  BMI 31.55 kg/m2  SpO2 97%  Physical Exam  Physical Exam  Constitutional: She is oriented to person, place, and time and well-developed, well-nourished, and in no distress. No  distress.  HENT:  Head: Normocephalic and atraumatic.  Eyes: Conjunctivae normal are normal.  Neck: Neck supple. No thyromegaly present.  Cardiovascular: Normal rate, regular rhythm and normal heart sounds.   No murmur heard. Pulmonary/Chest: Effort normal and breath sounds normal. She has no wheezes.  Abdominal: She exhibits no distension and no mass.  Musculoskeletal: She exhibits no edema.  Lymphadenopathy:    She has no cervical adenopathy.  Neurological: She is alert and oriented to person, place, and time.  Skin: Skin is warm and dry. No rash noted. She is not diaphoretic.  Psychiatric: Memory, affect and judgment normal.    Lab Results  Component Value Date   TSH 2.68 05/01/2012   Lab Results  Component Value Date   WBC 5.3 05/01/2012   HGB 14.6 05/01/2012   HCT 44.2 05/01/2012   MCV 90.1 05/01/2012   PLT 179.0 05/01/2012   Lab Results  Component Value Date   CREATININE 0.9 05/01/2012   BUN 22 05/01/2012   NA 139 05/01/2012   K 5.0 05/01/2012   CL 107 05/01/2012   CO2 26 05/01/2012   Lab Results  Component Value Date   ALT 22 05/01/2012   AST 26 05/01/2012   ALKPHOS 86 05/01/2012   BILITOT 0.7 05/01/2012   Lab Results  Component Value Date   CHOL 267* 05/01/2012   Lab Results  Component Value Date   HDL 124.00 05/01/2012   Lab Results  Component Value Date   LDLCALC 122* 08/08/2011   Lab Results  Component Value Date   TRIG 137.0 05/01/2012   Lab Results  Component Value Date   CHOLHDL 2 05/01/2012     Assessment & Plan  Elevated BP Improved today, has been labile in past. Will continue to monitor, will have her check her bp weekly and call us if numbers above 135/90 consistently  Overweight Will restart Phentermine 15 mg daily, which she tolerated well and did suppress her appetite. Will monitor bp  Hyperlipidemia Continue Krill oil and exercise and receh

## 2012-07-18 NOTE — Assessment & Plan Note (Signed)
Will restart Phentermine 15 mg daily, which she tolerated well and did suppress her appetite. Will monitor bp

## 2012-07-18 NOTE — Assessment & Plan Note (Signed)
Improved today, has been labile in past. Will continue to monitor, will have her check her bp weekly and call us if numbers above 135/90 consistently

## 2012-08-28 ENCOUNTER — Ambulatory Visit (INDEPENDENT_AMBULATORY_CARE_PROVIDER_SITE_OTHER): Payer: 59 | Admitting: Family Medicine

## 2012-08-28 ENCOUNTER — Encounter: Payer: Self-pay | Admitting: Family Medicine

## 2012-08-28 VITALS — BP 127/88 | HR 82 | Temp 97.9°F | Ht 61.0 in | Wt 167.1 lb

## 2012-08-28 DIAGNOSIS — R030 Elevated blood-pressure reading, without diagnosis of hypertension: Secondary | ICD-10-CM

## 2012-08-28 DIAGNOSIS — E785 Hyperlipidemia, unspecified: Secondary | ICD-10-CM

## 2012-08-28 DIAGNOSIS — E663 Overweight: Secondary | ICD-10-CM

## 2012-08-28 DIAGNOSIS — IMO0001 Reserved for inherently not codable concepts without codable children: Secondary | ICD-10-CM

## 2012-08-28 MED ORDER — PHENTERMINE HCL 37.5 MG PO CAPS: 37.5000 mg | ORAL_CAPSULE | ORAL | Status: DC

## 2012-08-28 NOTE — Assessment & Plan Note (Signed)
Continue Krill oil and avoid trans fats

## 2012-08-28 NOTE — Assessment & Plan Note (Signed)
Encouraged heart healthy diet and increased Phentermine 37.5 mg po daily, recheck vitals in 1 month. Warned regarding possible Side effects will stop med if any concerns.

## 2012-08-28 NOTE — Assessment & Plan Note (Signed)
Good control today

## 2012-08-28 NOTE — Patient Instructions (Addendum)
Aspercreme twice day  Degenerative Arthritis You have osteoarthritis. This is the wear and tear arthritis that comes with aging. It is also called degenerative arthritis. This is common in people past middle age. It is caused by stress on the joints. The large weight bearing joints of the lower extremities are most often affected. The knees, hips, back, neck, and hands can become painful, swollen, and stiff. This is the most common type of arthritis. It comes on with age, carrying too much weight, or from an injury. Treatment includes resting the sore joint until the pain and swelling improve. Crutches or a walker may be needed for severe flares. Only take over-the-counter or prescription medicines for pain, discomfort, or fever as directed by your caregiver. Local heat therapy may improve motion. Cortisone shots into the joint are sometimes used to reduce pain and swelling during flares. Osteoarthritis is usually not crippling and progresses slowly. There are things you can do to decrease pain:  Avoid high impact activities.  Exercise regularly.  Low impact exercises such as walking, biking and swimming help to keep the muscles strong and keep normal joint function.  Stretching helps to keep your range of motion.  Lose weight if you are overweight. This reduces joint stress. In severe cases when you have pain at rest or increasing disability, joint surgery may be helpful. See your caregiver for follow-up treatment as recommended.  SEEK IMMEDIATE MEDICAL CARE IF:   You have severe joint pain.  Marked swelling and redness in your joint develops.  You develop a high fever. Document Released: 07/17/2005 Document Revised: 10/09/2011 Document Reviewed: 12/17/2006 [facility] Patient Information 2013 [place], [place].

## 2012-08-28 NOTE — Progress Notes (Signed)
Patient ID: Sheryl [name], female   DOB: [birth year], 52 y.o.   MRN: [id number] IOWA KAPPES [id number] 08-Aug-1960 08/28/2012      Progress Note-Follow Up  Subjective  Chief Complaint  Chief Complaint  Patient presents with  . Follow-up    1 month on Phentermine    HPI  Patient is a 52 year old Caucasian female in today for follow up on weight. No trouble with side effects from the Phentermine, no HA/palp/cp/insomnia/gi or gu c/o. Is c/o some stiffness and pain intermittently in her right hand 3 rd finger. She injured it years ago and it required surgical debridement. No c/o redness or warmth.  Past Medical History  Diagnosis Date  . Chicken pox as a child  . Anxiety   . GERD (gastroesophageal reflux disease)   . Depression   . Overweight   . Anemia   . Preventative health care 06/07/2011  . FH: colon cancer 06/07/2011  . Elevated BP 02/06/2012  . Hyperlipidemia 05/08/2012  . Sacral pain 05/08/2012    Past Surgical History  Procedure Date  . Gum surgery 2007  . Right hand surgery 2009    infection debrided  . Cesarean section 1985 and 1989    X 2  . Colonoscopy     Family History  Problem Relation Age of Onset  . Heart attack Mother   . Diabetes Mother     type 2  . Hypertension Mother   . Hyperlipidemia Mother   . Cancer Father     colon  . Heart attack Father   . Colon cancer Father   . Hypertension Sister   . Diabetes Sister     type 2  . Hypertension Sister   . Diabetes Maternal Grandmother   . Hypertension Maternal Grandmother   . Hyperlipidemia Maternal Grandmother     History   Social History  . Marital Status: Married    Spouse Name: N/A    Number of Children: N/A  . Years of Education: N/A   Occupational History  . Not on file.   Social History Main Topics  . Smoking status: Never Smoker   . Smokeless tobacco: Never Used  . Alcohol Use: 7.0 oz/week    14 drink(s) per week     Comment: glass of wine nightly  . Drug Use: No  .  Sexually Active: Yes -- Female partner(s)   Other Topics Concern  . Not on file   Social History Narrative  . No narrative on file    Current Outpatient Prescriptions on File Prior to Visit  Medication Sig Dispense Refill  . ALPRAZolam (XANAX) 0.25 MG tablet Take 1 tablet (0.25 mg total) by mouth at bedtime.  30 tablet  1  . Krill Oil CAPS 1 cap daily MegaRed by Schiff      . Multiple Vitamin (MULTIVITAMIN) capsule Take 1 capsule by mouth daily.        . pantoprazole (PROTONIX) 40 MG tablet Take 1 tablet (40 mg total) by mouth daily.  90 tablet  3  . [DISCONTINUED] FLUoxetine (PROZAC) 20 MG capsule Take 20 mg by mouth daily.          Allergies  Allergen Reactions  . Penicillins Rash    Review of Systems  Review of Systems  Constitutional: Negative for fever and malaise/fatigue.  HENT: Negative for congestion.   Eyes: Negative for discharge.  Respiratory: Negative for shortness of breath.   Cardiovascular: Negative for chest pain, palpitations and leg swelling.  Gastrointestinal: Negative for nausea, abdominal pain and diarrhea.  Genitourinary: Negative for dysuria.  Musculoskeletal: Positive for joint pain. Negative for falls.       Right 3 digit stiffness and pain, injured this finger years ago and required surgical debridement  Skin: Negative for rash.  Neurological: Negative for loss of consciousness and headaches.  Endo/Heme/Allergies: Negative for polydipsia.  Psychiatric/Behavioral: Negative for depression and suicidal ideas. The patient is not nervous/anxious and does not have insomnia.     Objective  BP 127/88  Pulse 82  Temp 97.9 F (36.6 C) (Temporal)  Ht 5\' 1"  (1.549 m)  Wt 167 lb 1.9 oz (75.805 kg)  BMI 31.58 kg/m2  SpO2 99%  Physical Exam  Physical Exam  Constitutional: She is oriented to person, place, and time and well-developed, well-nourished, and in no distress. No distress.  HENT:  Head: Normocephalic and atraumatic.  Eyes: Conjunctivae  normal are normal.  Neck: Neck supple. No thyromegaly present.  Cardiovascular: Normal rate, regular rhythm and normal heart sounds.   No murmur heard. Pulmonary/Chest: Effort normal and breath sounds normal. She has no wheezes.  Abdominal: She exhibits no distension and no mass.  Musculoskeletal: She exhibits no edema.  Lymphadenopathy:    She has no cervical adenopathy.  Neurological: She is alert and oriented to person, place, and time.  Skin: Skin is warm and dry. No rash noted. She is not diaphoretic.  Psychiatric: Memory, affect and judgment normal.    Lab Results  Component Value Date   TSH 2.68 05/01/2012   Lab Results  Component Value Date   WBC 5.3 05/01/2012   HGB 14.6 05/01/2012   HCT 44.2 05/01/2012   MCV 90.1 05/01/2012   PLT 179.0 05/01/2012   Lab Results  Component Value Date   CREATININE 0.9 05/01/2012   BUN 22 05/01/2012   NA 139 05/01/2012   K 5.0 05/01/2012   CL 107 05/01/2012   CO2 26 05/01/2012   Lab Results  Component Value Date   ALT 22 05/01/2012   AST 26 05/01/2012   ALKPHOS 86 05/01/2012   BILITOT 0.7 05/01/2012   Lab Results  Component Value Date   CHOL 267* 05/01/2012   Lab Results  Component Value Date   HDL 124.00 05/01/2012   Lab Results  Component Value Date   LDLCALC 122* 08/08/2011   Lab Results  Component Value Date   TRIG 137.0 05/01/2012   Lab Results  Component Value Date   CHOLHDL 2 05/01/2012     Assessment & Plan  Overweight Encouraged heart healthy diet and increased Phentermine 37.5 mg po daily, recheck vitals in 1 month. Warned regarding possible Side effects will stop med if any concerns.  Elevated BP Good control today.   Hyperlipidemia Continue Krill oil and avoid trans fats

## 2012-09-25 ENCOUNTER — Ambulatory Visit: Payer: 59 | Admitting: Family Medicine

## 2012-10-11 ENCOUNTER — Ambulatory Visit (INDEPENDENT_AMBULATORY_CARE_PROVIDER_SITE_OTHER): Payer: 59 | Admitting: Family Medicine

## 2012-10-11 ENCOUNTER — Encounter: Payer: Self-pay | Admitting: Family Medicine

## 2012-10-11 VITALS — BP 122/86 | HR 83 | Temp 97.6°F | Ht 61.0 in | Wt 168.8 lb

## 2012-10-11 DIAGNOSIS — R030 Elevated blood-pressure reading, without diagnosis of hypertension: Secondary | ICD-10-CM

## 2012-10-11 DIAGNOSIS — IMO0001 Reserved for inherently not codable concepts without codable children: Secondary | ICD-10-CM

## 2012-10-11 DIAGNOSIS — E663 Overweight: Secondary | ICD-10-CM

## 2012-10-11 DIAGNOSIS — Z780 Asymptomatic menopausal state: Secondary | ICD-10-CM

## 2012-10-11 DIAGNOSIS — E785 Hyperlipidemia, unspecified: Secondary | ICD-10-CM

## 2012-10-11 NOTE — Assessment & Plan Note (Signed)
Did not tolerate the higher dose of Phentermine, has stopped it due to a panic attack. Will d/c and encouraged DASH diet, small frequent meals and increased exercise.

## 2012-10-11 NOTE — Assessment & Plan Note (Addendum)
Encouraged megaRed, no trans fats, minimize simple carbs and trans fats, add a fiber supplement and increase exercise. Recheck panel in 3-4 months

## 2012-10-11 NOTE — Assessment & Plan Note (Signed)
Improved at today's visit

## 2012-10-11 NOTE — Progress Notes (Signed)
Patient ID: Sheryl [name], female   DOB: 11/03/1960, 52 y.o.   MRN: [id number] Sheryl [name] [id number] [birth year] 10/11/2012      Progress Note-Follow Up  Subjective  Chief Complaint  Chief Complaint  Patient presents with  . Follow-up    4 week    HPI  Patient is a 52 female who is in today for followup. She stopped phentermine secondary to a panic attack. Since stopping it her psychiatrist Valinda Hoar has helped her adjust meds. She's been on sertraline and has increased her Xanax feels better. Denies any other recent concerns. No suicidal ideation and no more panic attacks. No fevers or chest pain. No palpitations shortness or breath, GI or GU concerns noted today.  Past Medical History  Diagnosis Date  . Chicken pox as a child  . Anxiety   . GERD (gastroesophageal reflux disease)   . Depression   . Overweight   . Anemia   . Preventative health care 06/07/2011  . FH: colon cancer 06/07/2011  . Elevated BP 02/06/2012  . Hyperlipidemia 05/08/2012  . Sacral pain 05/08/2012    Past Surgical History  Procedure Laterality Date  . Gum surgery  2007  . Right hand surgery  2009    infection debrided  . Cesarean section  1985 and 1989    X 2  . Colonoscopy      Family History  Problem Relation Age of Onset  . Heart attack Mother   . Diabetes Mother     type 2  . Hypertension Mother   . Hyperlipidemia Mother   . Cancer Father     colon  . Heart attack Father   . Colon cancer Father   . Hypertension Sister   . Diabetes Sister     type 2  . Hypertension Sister   . Diabetes Maternal Grandmother   . Hypertension Maternal Grandmother   . Hyperlipidemia Maternal Grandmother     History   Social History  . Marital Status: Married    Spouse Name: N/A    Number of Children: N/A  . Years of Education: N/A   Occupational History  . Not on file.   Social History Main Topics  . Smoking status: Never Smoker   . Smokeless tobacco: Never Used  . Alcohol Use:  7.0 oz/week    14 drink(s) per week     Comment: glass of wine nightly  . Drug Use: No  . Sexually Active: Yes -- Female partner(s)   Other Topics Concern  . Not on file   Social History Narrative  . No narrative on file    Current Outpatient Prescriptions on File Prior to Visit  Medication Sig Dispense Refill  . Krill Oil CAPS 1 cap daily MegaRed by Schiff      . Multiple Vitamin (MULTIVITAMIN) capsule Take 1 capsule by mouth daily.        . pantoprazole (PROTONIX) 40 MG tablet Take 1 tablet (40 mg total) by mouth daily.  90 tablet  3  . [DISCONTINUED] FLUoxetine (PROZAC) 20 MG capsule Take 20 mg by mouth daily.         No current facility-administered medications on file prior to visit.    Allergies  Allergen Reactions  . Penicillins Rash    Review of Systems  Review of Systems  Constitutional: Negative for malaise/fatigue.  HENT: Negative for congestion.   Eyes: Negative for discharge.  Respiratory: Negative for cough and sputum production.   Cardiovascular: Negative for chest  pain, palpitations and leg swelling.  Gastrointestinal: Negative for nausea, diarrhea and constipation.  Genitourinary: Negative for dysuria.  Musculoskeletal: Negative for myalgias and back pain.  Skin: Negative for rash.  Neurological: Negative for dizziness, focal weakness, weakness and headaches.  Psychiatric/Behavioral: Negative for depression and suicidal ideas. The patient is nervous/anxious. The patient does not have insomnia.     Objective  BP 122/86  Pulse 83  Temp(Src) 97.6 F (36.4 C) (Temporal)  Ht 5\' 1"  (1.549 m)  Wt 168 lb 12.8 oz (76.567 kg)  BMI 31.91 kg/m2  SpO2 99%  Physical Exam  Physical Exam  Constitutional: She is oriented to person, place, and time and well-developed, well-nourished, and in no distress. No distress.  HENT:  Head: Normocephalic and atraumatic.  Eyes: Conjunctivae are normal.  Neck: Neck supple. No thyromegaly present.  Cardiovascular:  Normal rate, regular rhythm and normal heart sounds.   No murmur heard. Pulmonary/Chest: Effort normal and breath sounds normal. She has no wheezes.  Abdominal: She exhibits no distension and no mass.  Musculoskeletal: She exhibits no edema.  Lymphadenopathy:    She has no cervical adenopathy.  Neurological: She is alert and oriented to person, place, and time.  Skin: Skin is warm and dry. No rash noted. She is not diaphoretic.  Psychiatric: Memory, affect and judgment normal.    Lab Results  Component Value Date   TSH 2.68 05/01/2012   Lab Results  Component Value Date   WBC 5.3 05/01/2012   HGB 14.6 05/01/2012   HCT 44.2 05/01/2012   MCV 90.1 05/01/2012   PLT 179.0 05/01/2012   Lab Results  Component Value Date   CREATININE 0.9 05/01/2012   BUN 22 05/01/2012   NA 139 05/01/2012   K 5.0 05/01/2012   CL 107 05/01/2012   CO2 26 05/01/2012   Lab Results  Component Value Date   ALT 22 05/01/2012   AST 26 05/01/2012   ALKPHOS 86 05/01/2012   BILITOT 0.7 05/01/2012   Lab Results  Component Value Date   CHOL 267* 05/01/2012   Lab Results  Component Value Date   HDL 124.00 05/01/2012   Lab Results  Component Value Date   LDLCALC 122* 08/08/2011   Lab Results  Component Value Date   TRIG 137.0 05/01/2012   Lab Results  Component Value Date   CHOLHDL 2 05/01/2012     Assessment & Plan  Overweight Did not tolerate the higher dose of Phentermine, has stopped it due to a panic attack. Will d/c and encouraged DASH diet, small frequent meals and increased exercise.  Elevated BP Improved at today's visit  Hyperlipidemia Encouraged megaRed, no trans fats, minimize simple carbs and trans fats, add a fiber supplement and increase exercise. Recheck panel in 3-4 months

## 2012-10-11 NOTE — Patient Instructions (Addendum)
Add a fiber supplement Cholesterol Cholesterol is a white, waxy, fat-like protein needed by your body in small amounts. The liver makes all the cholesterol you need. It is carried from the liver by the blood through the blood vessels. Deposits (plaque) may build up on blood vessel walls. This makes the arteries narrower and stiffer. Plaque increases the risk for heart attack and stroke. You cannot feel your cholesterol level even if it is very high. The only way to know is by a blood test to check your lipid (fats) levels. Once you know your cholesterol levels, you should keep a record of the test results. Work with your caregiver to to keep your levels in the desired range. WHAT THE RESULTS MEAN:  Total cholesterol is a rough measure of all the cholesterol in your blood.  LDL is the so-called bad cholesterol. This is the type that deposits cholesterol in the walls of the arteries. You want this level to be low.  HDL is the good cholesterol because it cleans the arteries and carries the LDL away. You want this level to be high.  Triglycerides are fat that the body can either burn for energy or store. High levels are closely linked to heart disease. DESIRED LEVELS:  Total cholesterol below 200.  LDL below 100 for people at risk, below 70 for very high risk.  HDL above 50 is good, above 60 is best.  Triglycerides below 150. HOW TO LOWER YOUR CHOLESTEROL:  Diet.  Choose fish or white meat chicken and [place], roasted or baked. Limit fatty cuts of red meat, fried foods, and processed meats, such as sausage and lunch meat.  Eat lots of fresh fruits and vegetables. Choose whole grains, beans, pasta, potatoes and cereals.  Use only small amounts of olive, corn or canola oils. Avoid butter, mayonnaise, shortening or palm kernel oils. Avoid foods with trans-fats.  Use skim/nonfat milk and low-fat/nonfat yogurt and cheeses. Avoid whole milk, cream, ice cream, egg yolks and cheeses. Healthy  desserts include angel food cake, ginger snaps, animal crackers, hard candy, popsicles, and low-fat/nonfat frozen yogurt. Avoid pastries, cakes, pies and cookies.  Exercise.  A regular program helps decrease LDL and raises HDL.  Helps with weight control.  Do things that increase your activity level like gardening, walking, or taking the stairs.  Medication.  May be prescribed by your caregiver to help lowering cholesterol and the risk for heart disease.  You may need medicine even if your levels are normal if you have several risk factors. HOME CARE INSTRUCTIONS   Follow your diet and exercise programs as suggested by your caregiver.  Take medications as directed.  Have blood work done when your caregiver feels it is necessary. MAKE SURE YOU:   Understand these instructions.  Will watch your condition.  Will get help right away if you are not doing well or get worse. Document Released: 04/11/2001 Document Revised: 10/09/2011 Document Reviewed: 10/02/2007 [facility] Patient Information 2013 [place], [place].

## 2012-10-12 ENCOUNTER — Encounter: Payer: Self-pay | Admitting: Family Medicine

## 2013-01-03 ENCOUNTER — Encounter: Payer: Self-pay | Admitting: Family

## 2013-01-03 ENCOUNTER — Ambulatory Visit (INDEPENDENT_AMBULATORY_CARE_PROVIDER_SITE_OTHER): Payer: 59 | Admitting: Family

## 2013-01-03 VITALS — HR 92 | Temp 98.5°F | Resp 18 | Ht 61.0 in | Wt 164.0 lb

## 2013-01-03 DIAGNOSIS — R3129 Other microscopic hematuria: Secondary | ICD-10-CM

## 2013-01-03 DIAGNOSIS — R109 Unspecified abdominal pain: Secondary | ICD-10-CM

## 2013-01-03 LAB — POCT URINALYSIS DIPSTICK
Glucose, UA: NEGATIVE
Nitrite, UA: NEGATIVE
Protein, UA: NEGATIVE
Urobilinogen, UA: 0.2

## 2013-01-03 MED ORDER — CIPROFLOXACIN HCL 500 MG PO TABS: 500.0000 mg | ORAL_TABLET | Freq: Two times a day (BID) | ORAL | Status: DC

## 2013-01-03 NOTE — Progress Notes (Signed)
Subjective:    Patient ID: Sheryl [name], female    DOB: 1960/09/03, 52 y.o.   MRN: [id number]  HPI  Ms.  Mccullum is a 52 yr old female who presents today with chief complaint of abdominal pain.  Pain started 2 days ago and is worsening. Reports that pain comes and goes.  Describes pain as cramping, "like you have to go to the bathroom (BM)."  She has had regular bowel movements.  Her last BP was this AM. Denies black/bloody stools, dysuria or fever.  Denies previous hx of similar symptoms.   Review of Systems See HPI  Past Medical History  Diagnosis Date  . Chicken pox as a child  . Anxiety   . GERD (gastroesophageal reflux disease)   . Depression   . Overweight(278.02)   . Anemia   . Preventative health care 06/07/2011  . FH: colon cancer 06/07/2011  . Elevated BP 02/06/2012  . Hyperlipidemia 05/08/2012  . Sacral pain 05/08/2012    History   Social History  . Marital Status: Married    Spouse Name: N/A    Number of Children: N/A  . Years of Education: N/A   Occupational History  . Not on file.   Social History Main Topics  . Smoking status: Never Smoker   . Smokeless tobacco: Never Used  . Alcohol Use: 7.0 oz/week    14 drink(s) per week     Comment: glass of wine nightly  . Drug Use: No  . Sexually Active: Yes -- Female partner(s)   Other Topics Concern  . Not on file   Social History Narrative  . No narrative on file    Past Surgical History  Procedure Laterality Date  . Gum surgery  2007  . Right hand surgery  2009    infection debrided  . Cesarean section  1985 and 1989    X 2  . Colonoscopy      Family History  Problem Relation Age of Onset  . Heart attack Mother   . Diabetes Mother     type 2  . Hypertension Mother   . Hyperlipidemia Mother   . Cancer Father     colon  . Heart attack Father   . Colon cancer Father   . Hypertension Sister   . Diabetes Sister     type 2  . Hypertension Sister   . Diabetes Maternal Grandmother   .  Hypertension Maternal Grandmother   . Hyperlipidemia Maternal Grandmother     Allergies  Allergen Reactions  . Penicillins Rash    Current Outpatient Prescriptions on File Prior to Visit  Medication Sig Dispense Refill  . ALPRAZolam (XANAX) 0.5 MG tablet Take 0.5 mg by mouth daily.      [employer] CAPS 1 cap daily MegaRed by Schiff      . Multiple Vitamin (MULTIVITAMIN) capsule Take 1 capsule by mouth daily.        . pantoprazole (PROTONIX) 40 MG tablet Take 1 tablet (40 mg total) by mouth daily.  90 tablet  3  . sertraline (ZOLOFT) 100 MG tablet Take 100 mg by mouth daily.      . [DISCONTINUED] FLUoxetine (PROZAC) 20 MG capsule Take 20 mg by mouth daily.         No current facility-administered medications on file prior to visit.    Pulse 92  Temp(Src) 98.5 F (36.9 C) (Oral)  Resp 18  Ht 5\' 1"  (1.549 m)  Wt 164 lb 0.6 oz (  74.408 kg)  BMI 31.01 kg/m2  SpO2 98%       Objective:   Physical Exam  Constitutional: She is oriented to person, place, and time. She appears well-developed and well-nourished. No distress.  Cardiovascular: Normal rate and regular rhythm.   No murmur heard. Pulmonary/Chest: Effort normal and breath sounds normal. No respiratory distress. She has no wheezes. She has no rales. She exhibits no tenderness.  Abdominal: Soft.  + suprapubic tenderness to palpation  abd is soft, NT/ND   Neurological: She is alert and oriented to person, place, and time.  Psychiatric: She has a normal mood and affect. Her behavior is normal. Judgment and thought content normal.          Assessment & Plan:

## 2013-01-03 NOTE — Patient Instructions (Addendum)
Please call if symptoms worsen or if not improved in 2-3 days.   

## 2013-01-05 DIAGNOSIS — R1030 Lower abdominal pain, unspecified: Secondary | ICD-10-CM | POA: Insufficient documentation

## 2013-01-05 LAB — URINE CULTURE
Colony Count: NO GROWTH
Organism ID, Bacteria: NO GROWTH

## 2013-01-05 NOTE — Assessment & Plan Note (Signed)
Pt treated empirically with cipro for possible UTI.  UA noted trace blood.  Case discussed with Dr Abner Greenspan.  Urine culture is negative.  Will follow up with pt.  If pain persists plan additional abdominal imaging.

## 2013-01-10 ENCOUNTER — Other Ambulatory Visit (INDEPENDENT_AMBULATORY_CARE_PROVIDER_SITE_OTHER): Payer: 59

## 2013-01-10 DIAGNOSIS — E663 Overweight: Secondary | ICD-10-CM

## 2013-01-10 DIAGNOSIS — R030 Elevated blood-pressure reading, without diagnosis of hypertension: Secondary | ICD-10-CM

## 2013-01-10 DIAGNOSIS — IMO0001 Reserved for inherently not codable concepts without codable children: Secondary | ICD-10-CM

## 2013-01-10 LAB — CBC
HCT: 42.4 % (ref 36.0–46.0)
RDW: 13.8 % (ref 11.5–14.6)
WBC: 5.1 10*3/uL (ref 4.5–10.5)

## 2013-01-10 LAB — LDL CHOLESTEROL, DIRECT: Direct LDL: 103.8 mg/dL

## 2013-01-10 LAB — RENAL FUNCTION PANEL
Albumin: 3.6 g/dL (ref 3.5–5.2)
BUN: 17 mg/dL (ref 6–23)
CO2: 24 mEq/L (ref 19–32)
Calcium: 10.5 mg/dL (ref 8.4–10.5)
Chloride: 108 mEq/L (ref 96–112)
GFR: 66.25 mL/min (ref >60.00)

## 2013-01-10 LAB — HEPATIC FUNCTION PANEL
AST: 38 U/L — ABNORMAL HIGH (ref 0–37)
Albumin: 3.6 g/dL (ref 3.5–5.2)
Total Protein: 6.7 g/dL (ref 6.0–8.3)

## 2013-01-10 LAB — LIPID PANEL
HDL: 128.3 mg/dL (ref >39.00)
VLDL: 24 mg/dL (ref 0.0–40.0)

## 2013-01-10 LAB — TSH: TSH: 1.24 u[IU]/mL (ref 0.35–5.50)

## 2013-01-10 NOTE — Progress Notes (Signed)
Labs only

## 2013-01-17 ENCOUNTER — Telehealth: Payer: Self-pay | Admitting: Family Medicine

## 2013-01-17 ENCOUNTER — Ambulatory Visit: Payer: 59 | Admitting: Family Medicine

## 2013-01-17 ENCOUNTER — Encounter: Payer: Self-pay | Admitting: Family Medicine

## 2013-01-17 ENCOUNTER — Ambulatory Visit (INDEPENDENT_AMBULATORY_CARE_PROVIDER_SITE_OTHER): Payer: 59 | Admitting: Family Medicine

## 2013-01-17 VITALS — BP 120/80 | HR 78 | Temp 98.1°F | Ht 61.0 in | Wt 166.0 lb

## 2013-01-17 DIAGNOSIS — E785 Hyperlipidemia, unspecified: Secondary | ICD-10-CM

## 2013-01-17 DIAGNOSIS — R030 Elevated blood-pressure reading, without diagnosis of hypertension: Secondary | ICD-10-CM

## 2013-01-17 DIAGNOSIS — R7989 Other specified abnormal findings of blood chemistry: Secondary | ICD-10-CM

## 2013-01-17 DIAGNOSIS — D649 Anemia, unspecified: Secondary | ICD-10-CM

## 2013-01-17 DIAGNOSIS — IMO0001 Reserved for inherently not codable concepts without codable children: Secondary | ICD-10-CM

## 2013-01-17 DIAGNOSIS — F411 Generalized anxiety disorder: Secondary | ICD-10-CM

## 2013-01-17 DIAGNOSIS — Z0000 Encounter for general adult medical examination without abnormal findings: Secondary | ICD-10-CM

## 2013-01-17 DIAGNOSIS — F419 Anxiety disorder, unspecified: Secondary | ICD-10-CM

## 2013-01-17 NOTE — Telephone Encounter (Signed)
Lab order placed.

## 2013-01-17 NOTE — Telephone Encounter (Signed)
Labs prior to next visit, lipid, renal, cbc, hepatic, tsh, acute hepatitis panel for elevated liver function   Patient has appointment in late September and will be going to [facility] lab

## 2013-01-17 NOTE — Patient Instructions (Addendum)
Labs prior to next visit, lipid, renal, cbc, hepatic, tsh, acute hepatitis panel for elevated liver function Fatty Liver Fatty liver is the accumulation of fat in liver cells. It is also called hepatosteatosis or steatohepatitis. It is normal for your liver to contain some fat. If fat is more than 5 to 10% of your liver's weight, you have fatty liver.  There are often no symptoms (problems) for years while damage is still occurring. People often learn about their fatty liver when they have medical tests for other reasons. Fat can damage your liver for years or even decades without causing problems. When it becomes severe, it can cause fatigue, weight loss, weakness, and confusion. This makes you more likely to develop more serious liver problems. The liver is the largest organ in the body. It does a lot of work and often gives no warning signs when it is sick until late in a disease. The liver has many important jobs including:  Breaking down foods.  Storing vitamins, iron, and other minerals.  Making proteins.  Making bile for food digestion.  Breaking down many products including medications, alcohol and some poisons. CAUSES  There are a number of different conditions, medications, and poisons that can cause a fatty liver. Eating too many calories causes fat to build up in the liver. Not processing and breaking fats down normally may also cause this. Certain conditions, such as obesity, diabetes, and high triglycerides also cause this. Most fatty liver patients tend to be middle-aged and over weight.  Some causes of fatty liver are:  Alcohol over consumption.  Malnutrition.  Steroid use.  Valproic acid toxicity.  Obesity.  Cushing's syndrome.  Poisons.  Tetracycline in high dosages.  Pregnancy.  Diabetes.  Hyperlipidemia.  Rapid weight loss. Some people develop fatty liver even having none of these conditions. SYMPTOMS  Fatty liver most often causes no problems. This  is called asymptomatic.  It can be diagnosed with blood tests and also by a liver biopsy.  It is one of the most common causes of minor elevations of liver enzymes on routine blood tests.  Specialized Imaging of the liver using ultrasound, CT (computed tomography) scan, or MRI (magnetic resonance imaging) can suggest a fatty liver but a biopsy is needed to confirm it.  A biopsy involves taking a small sample of liver tissue. This is done by using a needle. It is then looked at under a microscope by a specialist. TREATMENT  It is important to treat the cause. Simple fatty liver without a medical reason may not need treatment.  Weight loss, fat restriction, and exercise in overweight patients produces inconsistent results but is worth trying.  Fatty liver due to alcohol toxicity may not improve even with stopping drinking.  Good control of diabetes may reduce fatty liver.  Lower your triglycerides through diet, medication or both.  Eat a balanced, healthy diet.  Increase your physical activity.  Get regular checkups from a liver specialist.  There are no medical or surgical treatments for a fatty liver or NASH, but improving your diet and increasing your exercise may help prevent or reverse some of the damage. PROGNOSIS  Fatty liver may cause no damage or it can lead to an inflammation of the liver. This is, called steatohepatitis. When it is linked to alcohol abuse, it is called alcoholic steatohepatitis. It often is not linked to alcohol. It is then called nonalcoholic steatohepatitis, or NASH. Over time the liver may become scarred and hardened. This condition is called  cirrhosis. Cirrhosis is serious and may lead to liver failure or cancer. NASH is one of the leading causes of cirrhosis. About 10-20% of Americans have fatty liver and a smaller 2-5% has NASH. Document Released: 09/01/2005 Document Revised: 10/09/2011 Document Reviewed: 10/25/2005 [facility] Patient Information 2014  [place], [place].

## 2013-01-19 ENCOUNTER — Encounter: Payer: Self-pay | Admitting: Family Medicine

## 2013-01-19 NOTE — Assessment & Plan Note (Signed)
Well controlled no changes 

## 2013-01-19 NOTE — Assessment & Plan Note (Signed)
Has had her Zoloft increased and found that helpful is following with her psychiatrist

## 2013-01-19 NOTE — Assessment & Plan Note (Signed)
Mild, avoid trans fats, increase exercise, continue krill oil

## 2013-01-19 NOTE — Progress Notes (Signed)
Patient ID: Sheryl [name], female   DOB: 04-17-1961, 52 y.o.   MRN: [id number] Sheryl [name] [id number] Aug 07, 1960 01/19/2013      Progress Note-Follow Up  Subjective  Chief Complaint  Chief Complaint  Patient presents with  . Follow-up    3 month    HPI  Patient is a 52 Caucasian female who is here today for followup. She is increased sertraline to 50 mg and she thinks that's been helpful. She offers no acute complaints. She denies any recent illness. No abdominal pain at this time. No fevers, headaches, chest pain, palpitations, shortness of breath, GI or GU complaints noted today.  Past Medical History  Diagnosis Date  . Chicken pox as a child  . Anxiety   . GERD (gastroesophageal reflux disease)   . Depression   . Overweight(278.02)   . Anemia   . Preventative health care 06/07/2011  . FH: colon cancer 06/07/2011  . Elevated BP 02/06/2012  . Hyperlipidemia 05/08/2012  . Sacral pain 05/08/2012    Past Surgical History  Procedure Laterality Date  . Gum surgery  2007  . Right hand surgery  2009    infection debrided  . Cesarean section  1985 and 1989    X 2  . Colonoscopy      Family History  Problem Relation Age of Onset  . Heart attack Mother   . Diabetes Mother     type 2  . Hypertension Mother   . Hyperlipidemia Mother   . Cancer Father     colon  . Heart attack Father   . Colon cancer Father   . Hypertension Sister   . Diabetes Sister     type 2  . Hypertension Sister   . Diabetes Maternal Grandmother   . Hypertension Maternal Grandmother   . Hyperlipidemia Maternal Grandmother     History   Social History  . Marital Status: Married    Spouse Name: N/A    Number of Children: N/A  . Years of Education: N/A   Occupational History  . Not on file.   Social History Main Topics  . Smoking status: Never Smoker   . Smokeless tobacco: Never Used  . Alcohol Use: 7.0 oz/week    14 drink(s) per week     Comment: glass of wine nightly  . Drug  Use: No  . Sexually Active: Yes -- Female partner(s)   Other Topics Concern  . Not on file   Social History Narrative  . No narrative on file    Current Outpatient Prescriptions on File Prior to Visit  Medication Sig Dispense Refill  . ALPRAZolam (XANAX) 0.5 MG tablet Take 0.5 mg by mouth daily.      . ciprofloxacin (CIPRO) 500 MG tablet Take 1 tablet (500 mg total) by mouth 2 (two) times daily.  6 tablet  0  . Krill Oil CAPS 1 cap daily MegaRed by Schiff      . Multiple Vitamin (MULTIVITAMIN) capsule Take 1 capsule by mouth daily.        . pantoprazole (PROTONIX) 40 MG tablet Take 1 tablet (40 mg total) by mouth daily.  90 tablet  3  . [DISCONTINUED] FLUoxetine (PROZAC) 20 MG capsule Take 20 mg by mouth daily.         No current facility-administered medications on file prior to visit.    Allergies  Allergen Reactions  . Penicillins Rash    Review of Systems  Review of Systems  Constitutional: Negative for  fever and malaise/fatigue.  HENT: Negative for congestion.   Eyes: Negative for discharge.  Respiratory: Negative for shortness of breath.   Cardiovascular: Negative for chest pain, palpitations and leg swelling.  Gastrointestinal: Negative for nausea, abdominal pain and diarrhea.  Genitourinary: Negative for dysuria.  Musculoskeletal: Negative for falls.  Skin: Negative for rash.  Neurological: Negative for loss of consciousness and headaches.  Endo/Heme/Allergies: Negative for polydipsia.  Psychiatric/Behavioral: Negative for depression and suicidal ideas. The patient is not nervous/anxious and does not have insomnia.     Objective  BP 120/80  Pulse 78  Temp(Src) 98.1 F (36.7 C) (Oral)  Ht 5\' 1"  (1.549 m)  Wt 166 lb (75.297 kg)  BMI 31.38 kg/m2  SpO2 98%  Physical Exam  Physical Exam  Constitutional: She is oriented to person, place, and time and well-developed, well-nourished, and in no distress. No distress.  HENT:  Head: Normocephalic and  atraumatic.  Eyes: Conjunctivae are normal.  Neck: Neck supple. No thyromegaly present.  Cardiovascular: Normal rate, regular rhythm and normal heart sounds.  Exam reveals no gallop.   No murmur heard. Pulmonary/Chest: Effort normal and breath sounds normal. She has no wheezes.  Abdominal: She exhibits no distension and no mass.  Musculoskeletal: She exhibits no edema.  Lymphadenopathy:    She has no cervical adenopathy.  Neurological: She is alert and oriented to person, place, and time.  Skin: Skin is warm and dry. No rash noted. She is not diaphoretic.  Psychiatric: Memory, affect and judgment normal.    Lab Results  Component Value Date   TSH 1.24 01/10/2013   Lab Results  Component Value Date   WBC 5.1 01/10/2013   HGB 14.1 01/10/2013   HCT 42.4 01/10/2013   MCV 91.3 01/10/2013   PLT 211.0 01/10/2013   Lab Results  Component Value Date   CREATININE 1.0 01/10/2013   BUN 17 01/10/2013   NA 140 01/10/2013   K 4.4 01/10/2013   CL 108 01/10/2013   CO2 24 01/10/2013   Lab Results  Component Value Date   ALT 40* 01/10/2013   AST 38* 01/10/2013   ALKPHOS 83 01/10/2013   BILITOT 0.8 01/10/2013   Lab Results  Component Value Date   CHOL 256* 01/10/2013   Lab Results  Component Value Date   HDL 128.30 01/10/2013   Lab Results  Component Value Date   LDLCALC 122* 08/08/2011   Lab Results  Component Value Date   TRIG 120.0 01/10/2013   Lab Results  Component Value Date   CHOLHDL 2 01/10/2013     Assessment & Plan  Elevated BP Well controlled no changes  Hyperlipidemia Mild, avoid trans fats, increase exercise, continue krill oil  Anxiety Has had her Zoloft increased and found that helpful is following with her psychiatrist

## 2013-04-22 ENCOUNTER — Ambulatory Visit: Payer: 59 | Admitting: Family Medicine

## 2013-05-08 ENCOUNTER — Encounter: Payer: Self-pay | Admitting: Family Medicine

## 2013-05-08 ENCOUNTER — Ambulatory Visit (INDEPENDENT_AMBULATORY_CARE_PROVIDER_SITE_OTHER): Payer: 59 | Admitting: Family Medicine

## 2013-05-08 VITALS — BP 120/90 | HR 82 | Temp 98.2°F | Ht 61.0 in | Wt 163.0 lb

## 2013-05-08 DIAGNOSIS — R945 Abnormal results of liver function studies: Secondary | ICD-10-CM

## 2013-05-08 DIAGNOSIS — D649 Anemia, unspecified: Secondary | ICD-10-CM

## 2013-05-08 DIAGNOSIS — Z0000 Encounter for general adult medical examination without abnormal findings: Secondary | ICD-10-CM

## 2013-05-08 DIAGNOSIS — E785 Hyperlipidemia, unspecified: Secondary | ICD-10-CM

## 2013-05-08 DIAGNOSIS — IMO0001 Reserved for inherently not codable concepts without codable children: Secondary | ICD-10-CM

## 2013-05-08 DIAGNOSIS — K219 Gastro-esophageal reflux disease without esophagitis: Secondary | ICD-10-CM

## 2013-05-08 DIAGNOSIS — K769 Liver disease, unspecified: Secondary | ICD-10-CM

## 2013-05-08 DIAGNOSIS — R030 Elevated blood-pressure reading, without diagnosis of hypertension: Secondary | ICD-10-CM

## 2013-05-08 LAB — HEPATIC FUNCTION PANEL
ALT: 57 U/L — ABNORMAL HIGH (ref 0–35)
Bilirubin, Direct: 0.1 mg/dL (ref 0.0–0.3)
Total Bilirubin: 0.6 mg/dL (ref 0.3–1.2)

## 2013-05-08 MED ORDER — PANTOPRAZOLE SODIUM 40 MG PO TBEC: 40.0000 mg | DELAYED_RELEASE_TABLET | Freq: Every day | ORAL | Status: DC

## 2013-05-08 NOTE — Progress Notes (Signed)
Patient ID: Sheryl [name], female   DOB: [birth year], 52 y.o.   MRN: [id number] ARIYAH SEDLACK [id number] October 17, 1960 05/08/2013      Progress Note-Follow Up  Subjective  Chief Complaint  Chief Complaint  Patient presents with  . Follow-up    3 month    HPI  Patient is a 52 year old Caucasian female who is in today for followup. Overall she's felt well since her last visit. Denies headaches or chest pains. No palpitations, shortness of breath, GI or GU concerns. Has had very little trouble with peripheral edema since her last visit. Protonix has been helping her reflux. Sertraline has been helping her mood. She uses alprazolam intermittently with good results.  Past Medical History  Diagnosis Date  . Chicken pox as a child  . Anxiety   . GERD (gastroesophageal reflux disease)   . Depression   . Overweight(278.02)   . Anemia   . Preventative health care 06/07/2011  . FH: colon cancer 06/07/2011  . Elevated BP 02/06/2012  . Hyperlipidemia 05/08/2012  . Sacral pain 05/08/2012    Past Surgical History  Procedure Laterality Date  . Gum surgery  2007  . Right hand surgery  2009    infection debrided  . Cesarean section  1985 and 1989    X 2  . Colonoscopy      Family History  Problem Relation Age of Onset  . Heart attack Mother   . Diabetes Mother     type 2  . Hypertension Mother   . Hyperlipidemia Mother   . Cancer Father     colon  . Heart attack Father   . Colon cancer Father   . Hypertension Sister   . Diabetes Sister     type 2  . Hypertension Sister   . Diabetes Maternal Grandmother   . Hypertension Maternal Grandmother   . Hyperlipidemia Maternal Grandmother     History   Social History  . Marital Status: Married    Spouse Name: N/A    Number of Children: N/A  . Years of Education: N/A   Occupational History  . Not on file.   Social History Main Topics  . Smoking status: Never Smoker   . Smokeless tobacco: Never Used  . Alcohol Use: 7.0  oz/week    14 drink(s) per week     Comment: glass of wine nightly  . Drug Use: No  . Sexual Activity: Yes    Partners: Male   Other Topics Concern  . Not on file   Social History Narrative  . No narrative on file    Current Outpatient Prescriptions on File Prior to Visit  Medication Sig Dispense Refill  . ALPRAZolam (XANAX) 0.5 MG tablet Take 0.5 mg by mouth daily.      [employer] CAPS 1 cap daily MegaRed by Schiff      . Multiple Vitamin (MULTIVITAMIN) capsule Take 1 capsule by mouth daily.        . sertraline (ZOLOFT) 100 MG tablet Take 150 mg by mouth daily.      . [DISCONTINUED] FLUoxetine (PROZAC) 20 MG capsule Take 20 mg by mouth daily.         No current facility-administered medications on file prior to visit.    Allergies  Allergen Reactions  . Penicillins Rash    Review of Systems  Review of Systems  Constitutional: Negative for fever and malaise/fatigue.  HENT: Negative for congestion.   Eyes: Negative for discharge.  Respiratory: Negative  for shortness of breath.   Cardiovascular: Negative for chest pain, palpitations and leg swelling.  Gastrointestinal: Negative for nausea, abdominal pain and diarrhea.  Genitourinary: Negative for dysuria.  Musculoskeletal: Negative for falls.  Skin: Negative for rash.  Neurological: Negative for loss of consciousness and headaches.  Endo/Heme/Allergies: Negative for polydipsia.  Psychiatric/Behavioral: Negative for depression and suicidal ideas. The patient is not nervous/anxious and does not have insomnia.     Objective  BP 120/90  Pulse 82  Temp(Src) 98.2 F (36.8 C) (Oral)  Ht 5\' 1"  (1.549 m)  Wt 163 lb 0.6 oz (73.954 kg)  BMI 30.82 kg/m2  SpO2 97%  Physical Exam  Physical Exam  Constitutional: She is oriented to person, place, and time and well-developed, well-nourished, and in no distress. No distress.  HENT:  Head: Normocephalic and atraumatic.  Eyes: Conjunctivae are normal.  Neck: Neck supple.  No thyromegaly present.  Cardiovascular: Normal rate, regular rhythm and normal heart sounds.   No murmur heard. Pulmonary/Chest: Effort normal and breath sounds normal. She has no wheezes.  Abdominal: She exhibits no distension and no mass.  Musculoskeletal: She exhibits no edema.  Lymphadenopathy:    She has no cervical adenopathy.  Neurological: She is alert and oriented to person, place, and time.  Skin: Skin is warm and dry. No rash noted. She is not diaphoretic.  Psychiatric: Memory, affect and judgment normal.    Lab Results  Component Value Date   TSH 1.24 01/10/2013   Lab Results  Component Value Date   WBC 5.1 01/10/2013   HGB 14.1 01/10/2013   HCT 42.4 01/10/2013   MCV 91.3 01/10/2013   PLT 211.0 01/10/2013   Lab Results  Component Value Date   CREATININE 1.0 01/10/2013   BUN 17 01/10/2013   NA 140 01/10/2013   K 4.4 01/10/2013   CL 108 01/10/2013   CO2 24 01/10/2013   Lab Results  Component Value Date   ALT 40* 01/10/2013   AST 38* 01/10/2013   ALKPHOS 83 01/10/2013   BILITOT 0.8 01/10/2013   Lab Results  Component Value Date   CHOL 256* 01/10/2013   Lab Results  Component Value Date   HDL 128.30 01/10/2013   Lab Results  Component Value Date   LDLCALC 122* 08/08/2011   Lab Results  Component Value Date   TRIG 120.0 01/10/2013   Lab Results  Component Value Date   CHOLHDL 2 01/10/2013     Assessment & Plan  Anemia Resolved.  Preventative health care Declines flu shot, will consider Pneumovax  Elevated BP Well controlled, no changes.  Hyperlipidemia Continue krill oil, increase exercise, add fiber supplement, avoid trans fats, minimize saturated fats and simple carbs

## 2013-05-11 NOTE — Assessment & Plan Note (Signed)
Declines flu shot, will consider Pneumovax

## 2013-05-11 NOTE — Assessment & Plan Note (Signed)
Resolved

## 2013-05-11 NOTE — Assessment & Plan Note (Signed)
Well controlled, no changes 

## 2013-05-11 NOTE — Assessment & Plan Note (Signed)
Continue krill oil, increase exercise, add fiber supplement, avoid trans fats, minimize saturated fats and simple carbs

## 2013-05-12 ENCOUNTER — Ambulatory Visit: Payer: 59 | Admitting: Family Medicine

## 2013-06-05 ENCOUNTER — Encounter: Payer: Self-pay | Admitting: Physician Assistant

## 2013-06-05 ENCOUNTER — Ambulatory Visit (INDEPENDENT_AMBULATORY_CARE_PROVIDER_SITE_OTHER): Payer: 59 | Admitting: Physician Assistant

## 2013-06-05 VITALS — BP 112/70 | HR 84 | Temp 98.0°F | Resp 16 | Ht 61.0 in | Wt 164.8 lb

## 2013-06-05 DIAGNOSIS — R309 Painful micturition, unspecified: Secondary | ICD-10-CM

## 2013-06-05 DIAGNOSIS — R350 Frequency of micturition: Secondary | ICD-10-CM

## 2013-06-05 DIAGNOSIS — R945 Abnormal results of liver function studies: Secondary | ICD-10-CM | POA: Insufficient documentation

## 2013-06-05 DIAGNOSIS — R7989 Other specified abnormal findings of blood chemistry: Secondary | ICD-10-CM

## 2013-06-05 DIAGNOSIS — R300 Dysuria: Secondary | ICD-10-CM

## 2013-06-05 LAB — HEPATIC FUNCTION PANEL
AST: 33 U/L (ref 0–37)
Albumin: 3.8 g/dL (ref 3.5–5.2)
Total Bilirubin: 0.4 mg/dL (ref 0.3–1.2)
Total Protein: 6.3 g/dL (ref 6.0–8.3)

## 2013-06-05 MED ORDER — CIPROFLOXACIN HCL 250 MG PO TABS: 250.0000 mg | ORAL_TABLET | Freq: Two times a day (BID) | ORAL | Status: DC

## 2013-06-05 NOTE — Progress Notes (Signed)
Patient ID: Sheryl [name], female   DOB: September 25, 1960, 52 y.o.   MRN: [id number]  Patient presents to clinic today c/o urinary urgency, dysuria, suprapubic pressure x 3 days.  Patient denies hematuria, flank pain.  Has felt slightly nauseated today but denies emesis.  Denies fever, chills, sweats.  Denies vaginal pain, irritation, erythema or discharge.   Past Medical History  Diagnosis Date  . Chicken pox as a child  . Anxiety   . GERD (gastroesophageal reflux disease)   . Depression   . Overweight(278.02)   . Anemia   . Preventative health care 06/07/2011  . FH: colon cancer 06/07/2011  . Elevated BP 02/06/2012  . Hyperlipidemia 05/08/2012  . Sacral pain 05/08/2012    Current Outpatient Prescriptions on File Prior to Visit  Medication Sig Dispense Refill  . ALPRAZolam (XANAX) 0.5 MG tablet Take 0.5 mg by mouth daily.      [employer]ius CAPS 1 cap daily MegaRed by Schiff      . Multiple Vitamin (MULTIVITAMIN) capsule Take 1 capsule by mouth daily.        . pantoprazole (PROTONIX) 40 MG tablet Take 1 tablet (40 mg total) by mouth daily.  90 tablet  3  . sertraline (ZOLOFT) 100 MG tablet Take 150 mg by mouth daily.      . [DISCONTINUED] FLUoxetine (PROZAC) 20 MG capsule Take 20 mg by mouth daily.         No current facility-administered medications on file prior to visit.    Allergies  Allergen Reactions  . Penicillins Rash    Family History  Problem Relation Age of Onset  . Heart attack Mother   . Diabetes Mother     type 2  . Hypertension Mother   . Hyperlipidemia Mother   . Cancer Father     colon  . Heart attack Father   . Colon cancer Father   . Hypertension Sister   . Diabetes Sister     type 2  . Hypertension Sister   . Diabetes Maternal Grandmother   . Hypertension Maternal Grandmother   . Hyperlipidemia Maternal Grandmother     History   Social History  . Marital Status: Married    Spouse Name: N/A    Number of Children: N/A  . Years of Education: N/A    Social History Main Topics  . Smoking status: Never Smoker   . Smokeless tobacco: Never Used  . Alcohol Use: 7.0 oz/week    14 drink(s) per week     Comment: glass of wine nightly  . Drug Use: No  . Sexual Activity: Yes    Partners: Male   Other Topics Concern  . None   Social History Narrative  . None   ROS See HPI.  All other ROS are negative   Filed Vitals:   06/05/13 1603  BP: 112/70  Pulse: 84  Temp: 98 F (36.7 C)  Resp: 16   Physical Exam  Constitutional: She is oriented to person, place, and time and well-developed, well-nourished, and in no distress.  HENT:  Head: Normocephalic and atraumatic.  Eyes: Conjunctivae are normal.  Cardiovascular: Normal rate, regular rhythm and normal heart sounds.   Pulmonary/Chest: Effort normal and breath sounds normal. No respiratory distress. She has no wheezes. She has no rales. She exhibits no tenderness.  Abdominal: Soft. Bowel sounds are normal. She exhibits no distension and no mass. There is no rebound and no guarding.  Positive suprapubic discomfort on palpation.  Neurological:  She is alert and oriented to person, place, and time.  Skin: Skin is warm and dry. No rash noted.    Recent Results (from the past 2160 hour(s))  HEPATIC FUNCTION PANEL     Status: Abnormal   Collection Time    05/08/13  4:36 PM      Result Value Range   Total Bilirubin 0.6  0.3 - 1.2 mg/dL   Bilirubin, Direct 0.1  0.0 - 0.3 mg/dL   Indirect Bilirubin 0.5  0.0 - 0.9 mg/dL   Alkaline Phosphatase 103  39 - 117 U/L   AST 57 (*) 0 - 37 U/L   ALT 57 (*) 0 - 35 U/L   Total Protein 6.9  6.0 - 8.3 g/dL   Albumin 4.2  3.5 - 5.2 g/dL   Assessment/Plan: Dysuria Patient has taken AZO -- urine could not be dipped accurately.  Will send for micro and culture.  Rx Ciprofloxacin.  Increase fluids.  Probiotic and Cranberry supplement.    Abnormal liver function test Will repeat hepatic function panel due to prior abnormal test. Will proceed with  RUQ ultrasound if levels still elevated

## 2013-06-05 NOTE — Assessment & Plan Note (Signed)
Will repeat hepatic function panel due to prior abnormal test. Will proceed with RUQ ultrasound if levels still elevated

## 2013-06-05 NOTE — Patient Instructions (Signed)
Increase fluid intake.  Take antibiotic as prescribed until all tables are gone.  Start a probiotic and cranberry tablet.  Please call or return to clinic if symptoms not improving.  Return on Monday for urine sample.

## 2013-06-05 NOTE — Assessment & Plan Note (Signed)
Patient has taken AZO -- urine could not be dipped accurately.  Will send for micro and culture.  Rx Ciprofloxacin.  Increase fluids.  Probiotic and Cranberry supplement.

## 2013-06-06 ENCOUNTER — Telehealth: Payer: Self-pay | Admitting: Physician Assistant

## 2013-06-06 LAB — URINALYSIS, ROUTINE W REFLEX MICROSCOPIC
Glucose, UA: NEGATIVE mg/dL
Nitrite: POSITIVE — AB
Protein, ur: NEGATIVE mg/dL
pH: 6 (ref 5.0–8.0)

## 2013-06-06 LAB — URINALYSIS, MICROSCOPIC ONLY: Squamous Epithelial / LPF: NONE SEEN

## 2013-06-06 NOTE — Telephone Encounter (Signed)
Spoke with patient concerning lab results.  Liver function looks good.  No need for imaging at present.  Follow-up with PCP.  Urine culture pending.

## 2013-06-07 LAB — CULTURE, URINE COMPREHENSIVE: Colony Count: >=100000

## 2013-06-09 ENCOUNTER — Telehealth: Payer: Self-pay

## 2013-06-09 NOTE — Telephone Encounter (Deleted)
fasdf

## 2013-06-09 NOTE — Telephone Encounter (Signed)
Patient called and informed of results. Patient did not state that she was having any other symptoms at this time and she did not express a interest in an other visit at this moment.

## 2013-06-09 NOTE — Telephone Encounter (Signed)
Message copied by Eulis Manly on Mon Jun 09, 2013  1:09 PM ------      Message from: Marcelline Mates      Created: Sat Jun 07, 2013 10:24 AM       Urine culture positive for E. Coli.  Her current antibiotic should be taking care of the infection.  If she is still having symptoms, she should return to clinic for further evaluation. ------

## 2013-09-12 ENCOUNTER — Ambulatory Visit (INDEPENDENT_AMBULATORY_CARE_PROVIDER_SITE_OTHER): Payer: 59 | Admitting: Family Medicine

## 2013-09-12 ENCOUNTER — Encounter: Payer: Self-pay | Admitting: Family Medicine

## 2013-09-12 VITALS — BP 112/78 | HR 86 | Temp 98.0°F | Resp 14 | Ht 61.0 in | Wt 159.5 lb

## 2013-09-12 DIAGNOSIS — N649 Disorder of breast, unspecified: Secondary | ICD-10-CM

## 2013-09-12 DIAGNOSIS — E785 Hyperlipidemia, unspecified: Secondary | ICD-10-CM

## 2013-09-12 DIAGNOSIS — F411 Generalized anxiety disorder: Secondary | ICD-10-CM

## 2013-09-12 DIAGNOSIS — L988 Other specified disorders of the skin and subcutaneous tissue: Secondary | ICD-10-CM

## 2013-09-12 DIAGNOSIS — F419 Anxiety disorder, unspecified: Secondary | ICD-10-CM

## 2013-09-12 DIAGNOSIS — R945 Abnormal results of liver function studies: Secondary | ICD-10-CM

## 2013-09-12 DIAGNOSIS — R7989 Other specified abnormal findings of blood chemistry: Secondary | ICD-10-CM

## 2013-09-12 DIAGNOSIS — L578 Other skin changes due to chronic exposure to nonionizing radiation: Secondary | ICD-10-CM

## 2013-09-12 DIAGNOSIS — Z0000 Encounter for general adult medical examination without abnormal findings: Secondary | ICD-10-CM

## 2013-09-12 LAB — CBC
HCT: 45.1 % (ref 36.0–46.0)
HEMOGLOBIN: 15.9 g/dL — AB (ref 12.0–15.0)
MCH: 30.2 pg (ref 26.0–34.0)
MCHC: 35.3 g/dL (ref 30.0–36.0)
MCV: 85.7 fL (ref 78.0–100.0)
Platelets: 205 10*3/uL (ref 150–400)
RBC: 5.26 MIL/uL — AB (ref 3.87–5.11)
RDW: 14.1 % (ref 11.5–15.5)
WBC: 5.4 10*3/uL (ref 4.0–10.5)

## 2013-09-12 LAB — LIPID PANEL
CHOLESTEROL: 309 mg/dL — AB (ref 0–200)
HDL: 151 mg/dL (ref >39)
LDL Cholesterol: 140 mg/dL — ABNORMAL HIGH (ref 0–99)
Total CHOL/HDL Ratio: 2 Ratio
Triglycerides: 90 mg/dL (ref <150)
VLDL: 18 mg/dL (ref 0–40)

## 2013-09-12 LAB — RENAL FUNCTION PANEL
ALBUMIN: 4.3 g/dL (ref 3.5–5.2)
BUN: 18 mg/dL (ref 6–23)
CO2: 27 mEq/L (ref 19–32)
CREATININE: 0.96 mg/dL (ref 0.50–1.10)
Calcium: 10.9 mg/dL — ABNORMAL HIGH (ref 8.4–10.5)
Chloride: 102 mEq/L (ref 96–112)
Glucose, Bld: 104 mg/dL — ABNORMAL HIGH (ref 70–99)
PHOSPHORUS: 2.3 mg/dL (ref 2.3–4.6)
Potassium: 3.7 mEq/L (ref 3.5–5.3)
SODIUM: 141 meq/L (ref 135–145)

## 2013-09-12 LAB — HEPATIC FUNCTION PANEL
ALBUMIN: 4.3 g/dL (ref 3.5–5.2)
ALT: 44 U/L — ABNORMAL HIGH (ref 0–35)
AST: 48 U/L — ABNORMAL HIGH (ref 0–37)
Alkaline Phosphatase: 104 U/L (ref 39–117)
BILIRUBIN TOTAL: 0.6 mg/dL (ref 0.2–1.2)
Bilirubin, Direct: 0.1 mg/dL (ref 0.0–0.3)
Indirect Bilirubin: 0.5 mg/dL (ref 0.2–1.2)
Total Protein: 6.9 g/dL (ref 6.0–8.3)

## 2013-09-12 LAB — TSH: TSH: 2.837 u[IU]/mL (ref 0.350–4.500)

## 2013-09-12 NOTE — Patient Instructions (Signed)

## 2013-09-12 NOTE — Progress Notes (Signed)
Pre visit review using our clinic review tool, if applicable. No additional management support is needed unless otherwise documented below in the visit note/SLS  

## 2013-09-14 NOTE — Assessment & Plan Note (Signed)
Minimize caclium intake and recheck calcium and PTH levels.

## 2013-09-14 NOTE — Assessment & Plan Note (Signed)
Stable avoid simple carbs and trans fats.

## 2013-09-14 NOTE — Progress Notes (Signed)
Patient ID: Sheryl [name], female   DOB: 14-Jul-1961, 53 y.o.   MRN: [id number] Sheryl [name] [id number] March 08, 1961 09/14/2013      Progress Note-Follow Up  Subjective  Chief Complaint  Chief Complaint  Patient presents with  . Annual Exam    Patient not fasting for labs    HPI  Patient is a 53 year old female who is in today for annual exam. She is generally doing well. No recent illness. Denies headache, chest pain, palpitations or shortness of breath. No GI or GU concerns. Probiotics are helping her reflux. Taking medications as prescribed..  Past Medical History  Diagnosis Date  . Chicken pox as a child  . Anxiety   . GERD (gastroesophageal reflux disease)   . Depression   . Overweight   . Anemia   . Preventative health care 06/07/2011  . FH: colon cancer 06/07/2011  . Elevated BP 02/06/2012  . Hyperlipidemia 05/08/2012  . Sacral pain 05/08/2012    Past Surgical History  Procedure Laterality Date  . Gum surgery  2007  . Right hand surgery  2009    infection debrided  . Cesarean section  1985 and 1989    X 2  . Colonoscopy      Family History  Problem Relation Age of Onset  . Heart attack Mother   . Diabetes Mother     type 2  . Hypertension Mother   . Hyperlipidemia Mother   . Cancer Father     colon  . Heart attack Father   . Colon cancer Father   . Hypertension Sister   . Diabetes Sister     type 2  . Hypertension Sister   . Diabetes Maternal Grandmother   . Hypertension Maternal Grandmother   . Hyperlipidemia Maternal Grandmother     History   Social History  . Marital Status: Married    Spouse Name: N/A    Number of Children: N/A  . Years of Education: N/A   Occupational History  . Not on file.   Social History Main Topics  . Smoking status: Never Smoker   . Smokeless tobacco: Never Used  . Alcohol Use: 7.0 oz/week    14 drink(s) per week     Comment: glass of wine nightly  . Drug Use: No  . Sexual Activity: Yes    Partners:  Male     Comment: no dietary restrictions, lives with husband   Other Topics Concern  . Not on file   Social History Narrative  . No narrative on file    Current Outpatient Prescriptions on File Prior to Visit  Medication Sig Dispense Refill  . ALPRAZolam (XANAX) 0.5 MG tablet Take 0.5 mg by mouth daily.      Astrid Drafts CAPS 1 cap daily MegaRed by Schiff      . Multiple Vitamin (MULTIVITAMIN) capsule Take 1 capsule by mouth daily.        . pantoprazole (PROTONIX) 40 MG tablet Take 1 tablet (40 mg total) by mouth daily.  90 tablet  3  . sertraline (ZOLOFT) 100 MG tablet Take 150 mg by mouth daily.      . [DISCONTINUED] FLUoxetine (PROZAC) 20 MG capsule Take 20 mg by mouth daily.         No current facility-administered medications on file prior to visit.    Allergies  Allergen Reactions  . Penicillins Rash    Review of Systems  Review of Systems  Constitutional: Negative for fever and malaise/fatigue.  HENT: Negative for congestion.   Eyes: Negative for discharge.  Respiratory: Negative for shortness of breath.   Cardiovascular: Negative for chest pain, palpitations and leg swelling.  Gastrointestinal: Negative for nausea, abdominal pain and diarrhea.  Genitourinary: Negative for dysuria.  Musculoskeletal: Negative for falls.  Skin: Negative for rash.  Neurological: Negative for loss of consciousness and headaches.  Endo/Heme/Allergies: Negative for polydipsia.  Psychiatric/Behavioral: Negative for depression and suicidal ideas. The patient is not nervous/anxious and does not have insomnia.     Objective  BP 112/78  Pulse 86  Temp(Src) 98 F (36.7 C) (Oral)  Resp 14  Ht 5\' 1"  (1.549 m)  Wt 159 lb 8 oz (72.349 kg)  BMI 30.15 kg/m2  SpO2 97%  LMP 09/08/2011  Physical Exam  Physical Exam  Constitutional: She is oriented to person, place, and time and well-developed, well-nourished, and in no distress. No distress.  HENT:  Head: Normocephalic and atraumatic.   Right Ear: External ear normal.  Left Ear: External ear normal.  Nose: Nose normal.  Mouth/Throat: Oropharynx is clear and moist. No oropharyngeal exudate.  Eyes: Conjunctivae are normal. Pupils are equal, round, and reactive to light. Right eye exhibits no discharge. Left eye exhibits no discharge. No scleral icterus.  Neck: Normal range of motion. Neck supple. No thyromegaly present.  Cardiovascular: Normal rate, regular rhythm, normal heart sounds and intact distal pulses.   No murmur heard. Pulmonary/Chest: Effort normal and breath sounds normal. No respiratory distress. She has no wheezes. She has no rales.  Abdominal: Soft. Bowel sounds are normal. She exhibits no distension and no mass. There is no tenderness.  Musculoskeletal: Normal range of motion. She exhibits no edema and no tenderness.  Lymphadenopathy:    She has no cervical adenopathy.  Neurological: She is alert and oriented to person, place, and time. She has normal reflexes. No cranial nerve deficit. Coordination normal.  Skin: Skin is warm and dry. No rash noted. She is not diaphoretic.  Psychiatric: Mood, memory and affect normal.    Lab Results  Component Value Date   TSH 2.837 09/12/2013   Lab Results  Component Value Date   WBC 5.4 09/12/2013   HGB 15.9* 09/12/2013   HCT 45.1 09/12/2013   MCV 85.7 09/12/2013   PLT 205 09/12/2013   Lab Results  Component Value Date   CREATININE 0.96 09/12/2013   BUN 18 09/12/2013   NA 141 09/12/2013   K 3.7 09/12/2013   CL 102 09/12/2013   CO2 27 09/12/2013   Lab Results  Component Value Date   ALT 44* 09/12/2013   AST 48* 09/12/2013   ALKPHOS 104 09/12/2013   BILITOT 0.6 09/12/2013   Lab Results  Component Value Date   CHOL 309* 09/12/2013   Lab Results  Component Value Date   HDL 151 09/12/2013   Lab Results  Component Value Date   LDLCALC 140* 09/12/2013   Lab Results  Component Value Date   TRIG 90 09/12/2013   Lab Results  Component Value Date   CHOLHDL 2.0  09/12/2013     Assessment & Plan  Hyperlipidemia Worsening, avoid trans fats, encouraged to start Lipitor 10 mg po daily.   Hypercalcemia Minimize caclium intake and recheck calcium and PTH levels.   Anxiety Following with psychiatry but psychiatrist is due to retire. May begin receiving meds here.  Abnormal liver function test Stable avoid simple carbs and trans fats.  Preventative health care Encouraged heart healthy diet and regular exercise. Encouraged adequate sleep.

## 2013-09-14 NOTE — Assessment & Plan Note (Signed)
Following with psychiatry but psychiatrist is due to retire. May begin receiving meds here.

## 2013-09-14 NOTE — Assessment & Plan Note (Signed)
Encouraged heart healthy diet and regular exercise. Encouraged adequate sleep.

## 2013-09-14 NOTE — Assessment & Plan Note (Signed)
Worsening, avoid trans fats, encouraged to start Lipitor 10 mg po daily.

## 2013-09-20 ENCOUNTER — Telehealth: Payer: Self-pay | Admitting: *Deleted

## 2013-09-20 MED ORDER — ATORVASTATIN CALCIUM 10 MG PO TABS: 10.0000 mg | ORAL_TABLET | Freq: Every day | ORAL | Status: DC

## 2013-09-20 NOTE — Telephone Encounter (Signed)
Notified pt. Rx sent and lab order entered. Pt not taking any calcium supplements.

## 2013-09-20 NOTE — Telephone Encounter (Signed)
Message copied by Ronny Flurry on Sat Sep 20, 2013 10:57 AM ------      Message from: Penni Homans A      Created: Sun Sep 14, 2013  1:18 PM       Notify calcium up if taking supplements stop. Recheck calcium and check a pth in next one to 2 weeks. Also cholesterol significantly higher. I recommend Atorvastatin 10 mg po qhs, disp #30 with 3 refills and start Co q 10 enzyme daily ------

## 2013-11-12 ENCOUNTER — Telehealth: Payer: Self-pay | Admitting: *Deleted

## 2013-11-12 ENCOUNTER — Encounter: Payer: Self-pay | Admitting: Physician Assistant

## 2013-11-12 ENCOUNTER — Ambulatory Visit (INDEPENDENT_AMBULATORY_CARE_PROVIDER_SITE_OTHER): Payer: 59 | Admitting: Physician Assistant

## 2013-11-12 VITALS — BP 110/70 | HR 74 | Temp 98.4°F | Resp 16 | Ht 61.0 in | Wt 160.5 lb

## 2013-11-12 DIAGNOSIS — M545 Low back pain, unspecified: Secondary | ICD-10-CM

## 2013-11-12 DIAGNOSIS — R399 Unspecified symptoms and signs involving the genitourinary system: Secondary | ICD-10-CM | POA: Insufficient documentation

## 2013-11-12 DIAGNOSIS — R339 Retention of urine, unspecified: Secondary | ICD-10-CM

## 2013-11-12 DIAGNOSIS — R350 Frequency of micturition: Secondary | ICD-10-CM

## 2013-11-12 DIAGNOSIS — R822 Biliuria: Secondary | ICD-10-CM | POA: Insufficient documentation

## 2013-11-12 DIAGNOSIS — R3989 Other symptoms and signs involving the genitourinary system: Secondary | ICD-10-CM

## 2013-11-12 LAB — COMPREHENSIVE METABOLIC PANEL
ALK PHOS: 93 U/L (ref 39–117)
ALT: 38 U/L — ABNORMAL HIGH (ref 0–35)
AST: 43 U/L — ABNORMAL HIGH (ref 0–37)
Albumin: 4.1 g/dL (ref 3.5–5.2)
BUN: 17 mg/dL (ref 6–23)
CO2: 25 meq/L (ref 19–32)
Calcium: 10.9 mg/dL — ABNORMAL HIGH (ref 8.4–10.5)
Chloride: 104 mEq/L (ref 96–112)
Creat: 0.85 mg/dL (ref 0.50–1.10)
Glucose, Bld: 75 mg/dL (ref 70–99)
Potassium: 4.7 mEq/L (ref 3.5–5.3)
SODIUM: 139 meq/L (ref 135–145)
Total Bilirubin: 0.6 mg/dL (ref 0.2–1.2)
Total Protein: 6.8 g/dL (ref 6.0–8.3)

## 2013-11-12 LAB — CBC WITH DIFFERENTIAL/PLATELET
BASOS PCT: 1 % (ref 0–1)
Basophils Absolute: 0 10*3/uL (ref 0.0–0.1)
Eosinophils Absolute: 0.1 10*3/uL (ref 0.0–0.7)
Eosinophils Relative: 3 % (ref 0–5)
HCT: 45.5 % (ref 36.0–46.0)
HEMOGLOBIN: 15.7 g/dL — AB (ref 12.0–15.0)
LYMPHS ABS: 1.1 10*3/uL (ref 0.7–4.0)
LYMPHS PCT: 28 % (ref 12–46)
MCH: 29.2 pg (ref 26.0–34.0)
MCHC: 34.5 g/dL (ref 30.0–36.0)
MCV: 84.7 fL (ref 78.0–100.0)
MONOS PCT: 6 % (ref 3–12)
Monocytes Absolute: 0.2 10*3/uL (ref 0.1–1.0)
NEUTROS ABS: 2.4 10*3/uL (ref 1.7–7.7)
NEUTROS PCT: 62 % (ref 43–77)
Platelets: 178 10*3/uL (ref 150–400)
RBC: 5.37 MIL/uL — AB (ref 3.87–5.11)
RDW: 14.3 % (ref 11.5–15.5)
WBC: 3.9 10*3/uL — AB (ref 4.0–10.5)

## 2013-11-12 LAB — POCT URINALYSIS DIPSTICK
Blood, UA: NEGATIVE
GLUCOSE UA: NEGATIVE
KETONES UA: NEGATIVE
LEUKOCYTES UA: NEGATIVE
Nitrite, UA: NEGATIVE
Protein, UA: NEGATIVE
Spec Grav, UA: 1.02
Urobilinogen, UA: 0.2
pH, UA: 6

## 2013-11-12 MED ORDER — CIPROFLOXACIN HCL 500 MG PO TABS: 500.0000 mg | ORAL_TABLET | Freq: Two times a day (BID) | ORAL | Status: DC

## 2013-11-12 NOTE — Assessment & Plan Note (Signed)
Will obtain CBC and CMP to further assess bilirubinuria.  Exam unremarkable for Hepatomegaly or LUQ tenderness.  If enzymes elevated will obtain further workup including [place].

## 2013-11-12 NOTE — Patient Instructions (Signed)
Please obtain labs.  I will call you with your results.  Start antibiotic.  Take as directed.  Continue increasing fluid intake.  Take a daily cranberry supplement.  I will call you with your urine culture results.  If symptoms worsen, please call or return to clinic.

## 2013-11-12 NOTE — Progress Notes (Signed)
Pre visit review using our clinic review tool, if applicable. No additional management support is needed unless otherwise documented below in the visit note/SLS  

## 2013-11-12 NOTE — Assessment & Plan Note (Signed)
Classic symptoms.  Urine dip unremarkable.  Will send for culture.  Will empirically treat with 3 day course of Cipro.  Will D/C if culture negative.  Discussed with patient that she should give thought to topical estrogen therapy.  Otherwise increase fluid intake.  Cranberry supplement and daily probiotic.

## 2013-11-12 NOTE — Progress Notes (Signed)
Patient presents to clinic today c/o urinary urgency, frequency, dysuria and LBP.  Endorses incomplete bladder emptying x 3 days.  Denies hematuria, nausea, vomiting or fever.  Patient denies abdominal pain, but has noticed a darkening of her urine.  Denies change in bowel habits.  Past Medical History  Diagnosis Date  . Chicken pox as a child  . Anxiety   . GERD (gastroesophageal reflux disease)   . Depression   . Overweight   . Anemia   . Preventative health care 06/07/2011  . FH: colon cancer 06/07/2011  . Elevated BP 02/06/2012  . Hyperlipidemia 05/08/2012  . Sacral pain 05/08/2012    Current Outpatient Prescriptions on File Prior to Visit  Medication Sig Dispense Refill  . ALPRAZolam (XANAX) 0.5 MG tablet Take 0.5 mg by mouth daily.      Marland Kitchen atorvastatin (LIPITOR) 10 MG tablet Take 1 tablet (10 mg total) by mouth at bedtime.  30 tablet  3  . Coenzyme Q10 (CO Q 10 PO) Take 1 capsule by mouth daily.      Astrid Drafts CAPS 1 cap daily MegaRed by Schiff      . Multiple Vitamin (MULTIVITAMIN) capsule Take 1 capsule by mouth daily.        . pantoprazole (PROTONIX) 40 MG tablet Take 1 tablet (40 mg total) by mouth daily.  90 tablet  3  . sertraline (ZOLOFT) 100 MG tablet Take 150 mg by mouth daily.      . [DISCONTINUED] FLUoxetine (PROZAC) 20 MG capsule Take 20 mg by mouth daily.         No current facility-administered medications on file prior to visit.    Allergies  Allergen Reactions  . Penicillins Rash    Family History  Problem Relation Age of Onset  . Heart attack Mother   . Diabetes Mother     type 2  . Hypertension Mother   . Hyperlipidemia Mother   . Cancer Father     colon  . Heart attack Father   . Colon cancer Father   . Hypertension Sister   . Diabetes Sister     type 2  . Hypertension Sister   . Diabetes Maternal Grandmother   . Hypertension Maternal Grandmother   . Hyperlipidemia Maternal Grandmother     History   Social History  . Marital Status:  Married    Spouse Name: N/A    Number of Children: N/A  . Years of Education: N/A   Social History Main Topics  . Smoking status: Never Smoker   . Smokeless tobacco: Never Used  . Alcohol Use: 7.0 oz/week    14 drink(s) per week     Comment: glass of wine nightly  . Drug Use: No  . Sexual Activity: Yes    Partners: Male     Comment: no dietary restrictions, lives with husband   Other Topics Concern  . None   Social History Narrative  . None   Review of Systems - See HPI.  All other ROS are negative.  BP 110/70  Pulse 74  Temp(Src) 98.4 F (36.9 C) (Oral)  Resp 16  Ht 5\' 1"  (1.549 m)  Wt 160 lb 8 oz (72.802 kg)  BMI 30.34 kg/m2  SpO2 98%  LMP 09/08/2011  Physical Exam  Vitals reviewed. Constitutional: She is oriented to person, place, and time and well-developed, well-nourished, and in no distress.  HENT:  Head: Normocephalic and atraumatic.  Eyes: Conjunctivae are normal. Pupils are equal, round, and  reactive to light.  Neck: Neck supple.  Cardiovascular: Normal rate, regular rhythm, normal heart sounds and intact distal pulses.   Pulmonary/Chest: Effort normal and breath sounds normal. No respiratory distress.  Abdominal: Soft. Bowel sounds are normal. She exhibits no distension.  + suprapubic tenderness.  No LUQ tenderness of organomegaly noted on examination.  Neurological: She is alert and oriented to person, place, and time.  Skin: Skin is warm and dry.  Psychiatric: Affect normal.    Recent Results (from the past 2160 hour(s))  RENAL FUNCTION PANEL     Status: Abnormal   Collection Time    09/12/13  3:46 PM      Result Value Ref Range   Sodium 141  135 - 145 mEq/L   Potassium 3.7  3.5 - 5.3 mEq/L   Chloride 102  96 - 112 mEq/L   CO2 27  19 - 32 mEq/L   Glucose, Bld 104 (*) 70 - 99 mg/dL   BUN 18  6 - 23 mg/dL   Creat 0.96  0.50 - 1.10 mg/dL   Albumin 4.3  3.5 - 5.2 g/dL   Calcium 10.9 (*) 8.4 - 10.5 mg/dL   Phosphorus 2.3  2.3 - 4.6 mg/dL   HEPATIC FUNCTION PANEL     Status: Abnormal   Collection Time    09/12/13  3:46 PM      Result Value Ref Range   Total Bilirubin 0.6  0.2 - 1.2 mg/dL   Comment: ** Please note change in reference range(s). **   Bilirubin, Direct 0.1  0.0 - 0.3 mg/dL   Indirect Bilirubin 0.5  0.2 - 1.2 mg/dL   Comment: ** Please note change in reference range(s). **   Alkaline Phosphatase 104  39 - 117 U/L   AST 48 (*) 0 - 37 U/L   ALT 44 (*) 0 - 35 U/L   Total Protein 6.9  6.0 - 8.3 g/dL   Albumin 4.3  3.5 - 5.2 g/dL  TSH     Status: None   Collection Time    09/12/13  3:46 PM      Result Value Ref Range   TSH 2.837  0.350 - 4.500 uIU/mL  CBC     Status: Abnormal   Collection Time    09/12/13  3:46 PM      Result Value Ref Range   WBC 5.4  4.0 - 10.5 K/uL   RBC 5.26 (*) 3.87 - 5.11 MIL/uL   Hemoglobin 15.9 (*) 12.0 - 15.0 g/dL   HCT 45.1  36.0 - 46.0 %   MCV 85.7  78.0 - 100.0 fL   MCH 30.2  26.0 - 34.0 pg   MCHC 35.3  30.0 - 36.0 g/dL   RDW 14.1  11.5 - 15.5 %   Platelets 205  150 - 400 K/uL  LIPID PANEL     Status: Abnormal   Collection Time    09/12/13  3:46 PM      Result Value Ref Range   Cholesterol 309 (*) 0 - 200 mg/dL   Comment: ATP III Classification:           < 200        mg/dL        Desirable          200 - 239     mg/dL        Borderline High          >= 240  mg/dL        High         Triglycerides 90  <150 mg/dL   HDL 151  >39 mg/dL   Total CHOL/HDL Ratio 2.0     VLDL 18  0 - 40 mg/dL   LDL Cholesterol 140 (*) 0 - 99 mg/dL   Comment:       Total Cholesterol/HDL Ratio:CHD Risk                            Coronary Heart Disease Risk Table                                            Men       Women              1/2 Average Risk              3.4        3.3                  Average Risk              5.0        4.4               2X Average Risk              9.6        7.1               3X Average Risk             23.4       11.0     Use the calculated Patient  Ratio above and the CHD Risk table      to determine the patient's CHD Risk.     ATP III Classification (LDL):           < 100        mg/dL         Optimal          100 - 129     mg/dL         Near or Above Optimal          130 - 159     mg/dL         Borderline High          160 - 189     mg/dL         High           > 190        mg/dL         Very High        POCT URINALYSIS DIPSTICK     Status: None   Collection Time    11/12/13  9:53 AM      Result Value Ref Range   Color, UA Gold     Clarity, UA dark     Glucose, UA neg     Bilirubin, UA Moderate     Ketones, UA neg     Spec Grav, UA 1.020     Blood, UA neg     pH, UA 6.0     Protein, UA neg     Urobilinogen, UA 0.2     Nitrite, UA neg     Leukocytes,  UA Negative     Assessment/Plan: Bilirubin in urine Will obtain CBC and CMP to further assess bilirubinuria.  Exam unremarkable for Hepatomegaly or LUQ tenderness.  If enzymes elevated will obtain further workup including [place].  UTI symptoms Classic symptoms.  Urine dip unremarkable.  Will send for culture.  Will empirically treat with 3 day course of Cipro.  Will D/C if culture negative.  Discussed with patient that she should give thought to topical estrogen therapy.  Otherwise increase fluid intake.  Cranberry supplement and daily probiotic.

## 2013-11-12 NOTE — Telephone Encounter (Signed)
Pt presented to lab, order placed incorrectly. Corrected & forwarded order to [facility] lab/SLS

## 2013-11-13 ENCOUNTER — Other Ambulatory Visit: Payer: Self-pay | Admitting: Family Medicine

## 2013-11-13 DIAGNOSIS — E213 Hyperparathyroidism, unspecified: Secondary | ICD-10-CM

## 2013-11-13 LAB — PTH, INTACT AND CALCIUM
CALCIUM: 11 mg/dL — AB (ref 8.4–10.5)
PTH: 141 pg/mL — AB (ref 14.0–72.0)

## 2013-11-15 LAB — CULTURE, URINE COMPREHENSIVE

## 2013-12-23 ENCOUNTER — Ambulatory Visit (INDEPENDENT_AMBULATORY_CARE_PROVIDER_SITE_OTHER): Payer: 59 | Admitting: Endocrinology

## 2013-12-23 ENCOUNTER — Ambulatory Visit: Payer: 59

## 2013-12-23 ENCOUNTER — Encounter: Payer: Self-pay | Admitting: Endocrinology

## 2013-12-23 DIAGNOSIS — E210 Primary hyperparathyroidism: Secondary | ICD-10-CM

## 2013-12-23 NOTE — Progress Notes (Signed)
Patient ID: Sheryl [name], female   DOB: 11-17-1960, 53 y.o.   MRN: [id number]    Chief complaint: High calcium  History of Present Illness:   Review of records show that she has had a high calcium since at least 2013 as follows:  Lab Results  Component Value Date   CALCIUM 10.9* 11/12/2013   CALCIUM 11.0* 11/12/2013   CALCIUM 10.9* 09/12/2013   CALCIUM 10.5 01/10/2013   CALCIUM 10.5 05/01/2012   CALCIUM 10.6* 08/08/2011   Hypercalcemia appears to be more pronounced this year She does not have any symptoms of bone pain, decreased appetite, nausea, weight loss or known height loss  The hypercalcemia is not associated with any history of fractures, renal insufficiency, nephrolithiasis, sarcoidosis, known carcinoma or hyperthyroidism.   Prior serologic and radiologic studies have included: Lab Results  Component Value Date   PTH 141.0* 11/12/2013   CALCIUM 10.9* 11/12/2013   PHOS 2.3 09/12/2013    Has not had previous PTH levels and no vitamin D level is available      Medication List       This list is accurate as of: 12/23/13  9:16 AM.  Always use your most recent med list.               ALPRAZolam 0.5 MG tablet  Commonly known as:  XANAX  Take 0.5 mg by mouth daily.     atorvastatin 10 MG tablet  Commonly known as:  LIPITOR  Take 1 tablet (10 mg total) by mouth at bedtime.     ciprofloxacin 500 MG tablet  Commonly known as:  CIPRO  Take 1 tablet (500 mg total) by mouth 2 (two) times daily.     CO Q 10 PO  Take 1 capsule by mouth daily.     Krill Oil Caps  - 1 cap daily  - MegaRed by Schiff     multivitamin capsule  Take 1 capsule by mouth daily.     pantoprazole 40 MG tablet  Commonly known as:  PROTONIX  Take 1 tablet (40 mg total) by mouth daily.     ZOLOFT 100 MG tablet  Generic drug:  sertraline  Take 150 mg by mouth daily.        Allergies:  Allergies  Allergen Reactions  . Penicillins Rash    Past Medical History  Diagnosis Date  .  Chicken pox as a child  . Anxiety   . GERD (gastroesophageal reflux disease)   . Depression   . Overweight   . Anemia   . Preventative health care 06/07/2011  . FH: colon cancer 06/07/2011  . Elevated BP 02/06/2012  . Hyperlipidemia 05/08/2012  . Sacral pain 05/08/2012    Past Surgical History  Procedure Laterality Date  . Gum surgery  2007  . Right hand surgery  2009    infection debrided  . Cesarean section  1985 and 1989    X 2  . Colonoscopy      Family History  Problem Relation Age of Onset  . Heart attack Mother   . Diabetes Mother     type 2  . Hypertension Mother   . Hyperlipidemia Mother   . Cancer Father     colon  . Heart attack Father   . Colon cancer Father   . Hypertension Sister   . Diabetes Sister     type 2  . Hypertension Sister   . Diabetes Maternal Grandmother   . Hypertension Maternal Grandmother   .  Hyperlipidemia Maternal Grandmother     Social History:  reports that she has never smoked. She has never used smokeless tobacco. She reports that she drinks about 7 ounces of alcohol per week. She reports that she does not use illicit drugs.  Review of Systems  Constitutional: Negative for weight loss and malaise/fatigue.  HENT:       She feels secretions in her throat, describing this as clogging of the throat  Respiratory: Negative for shortness of breath.   Cardiovascular:       No history of hypertension  Gastrointestinal: Positive for heartburn. Negative for constipation.       At times feels difficulty in swallowing  Musculoskeletal: Negative for back pain and joint pain.  Skin: Negative for rash.  Neurological: Negative for sensory change, weakness and headaches.  Psychiatric/Behavioral: The patient is nervous/anxious.    She has been menopausal for about 2 years, has not had any hot flashes. She does tend to get hot easily but this is not new  She is on treatment for hypercholesterolemia recently  She has had a colonoscopy in the past  due to family history of colon cancer  She is on Zoloft and Xanax for anxiety  EXAM:  BP 124/78  Pulse 88  Temp(Src) 97.9 F (36.6 C)  Resp 14  Ht 5' 1.5" (1.562 m)  Wt 161 lb 12.8 oz (73.392 kg)  BMI 30.08 kg/m2  SpO2 97%  LMP 09/08/2011  GENERAL: Averagely built and nourished, has mild non-cushingoid obesity. She is anxious  No pallor, clubbing, cervical lymphadenopathy or edema.  Skin:  no rash or pigmentation.  EYES:  Externally normal.    ENT: Oral mucosa and tongue normal.  THYROID:  Not palpable.  HEART:  Normal  S1 and S2; no murmur or click.  CHEST:  Normal shape Lungs:   Vescicular breath sounds heard equally.  No crepitations/ wheeze.  ABDOMEN:  No distention.  Liver and spleen not palpable.  No other mass or tenderness.  NEUROLOGICAL: .Reflexes are normal bilaterally at biceps. Mild tremor present  SPINE AND JOINTS:  Normal.  Assessment/Plan:   HYPERCALCEMIA secondary to mild parathyroidism which has been present for at least 2 years and is currently asymptomatic She is not clearly a surgical candidate at this time because of her borderline age of 66, calcium level within 1.0 mg of the upper limit of normal and no known endorgan problems such as osteoporosis or nephrolithiasis. No history of hypertension or renal disease  Discussed with patient the need for evaluation of her bone health with a bone density and also vitamin D status will be evaluated with both 25 and 1, 25 hydroxy vitamin D levels  Discussed the nature of primary hyperparathyroidism as well as on the parathyroid glands and touch in relation and possible effects of hyperparathyroidism long-term on bone and kidneys Explained to her that if surgery is indicated this would be done after doing a parathyroid scan and most likely if she has single adenoma will need minimally invasive surgery  The patient was given an up-to-date handout on hyperparathyroidism and all questions answered. Vitamin D  level will be checked today Bone density to be scheduled   Elayne Snare 12/23/2013, 9:16 AM

## 2013-12-24 LAB — VITAMIN D 25 HYDROXY (VIT D DEFICIENCY, FRACTURES): VIT D 25 HYDROXY: 21 ng/mL — AB (ref 30–89)

## 2013-12-25 LAB — VITAMIN D 1,25 DIHYDROXY
Vitamin D 1, 25 (OH)2 Total: 70 pg/mL (ref 18–72)
Vitamin D2 1, 25 (OH)2: <8 pg/mL
Vitamin D3 1, 25 (OH)2: 70 pg/mL

## 2014-01-02 ENCOUNTER — Ambulatory Visit (INDEPENDENT_AMBULATORY_CARE_PROVIDER_SITE_OTHER)
Admission: RE | Admit: 2014-01-02 | Discharge: 2014-01-02 | Disposition: A | Discharge status: Home / Self Care | Payer: 59 | Source: Ambulatory Visit | Attending: Endocrinology | Admitting: Endocrinology

## 2014-01-02 DIAGNOSIS — E210 Primary hyperparathyroidism: Secondary | ICD-10-CM

## 2014-01-24 ENCOUNTER — Other Ambulatory Visit: Payer: Self-pay | Admitting: Family Medicine

## 2014-01-25 NOTE — Progress Notes (Signed)
Quick Note:  Please let patient know that the bone density does not show osteoporosis, followup with calcium in 6 months ______

## 2014-02-03 ENCOUNTER — Other Ambulatory Visit: Payer: Self-pay | Admitting: *Deleted

## 2014-03-13 ENCOUNTER — Encounter: Payer: Self-pay | Admitting: Family Medicine

## 2014-03-13 ENCOUNTER — Ambulatory Visit: Payer: 59 | Admitting: Family Medicine

## 2014-03-22 ENCOUNTER — Other Ambulatory Visit: Payer: Self-pay | Admitting: Family Medicine

## 2014-03-23 ENCOUNTER — Other Ambulatory Visit: Payer: Self-pay | Admitting: Family Medicine

## 2014-03-24 MED ORDER — ALPRAZOLAM 0.5 MG PO TABS: ORAL_TABLET | ORAL | Status: DC

## 2014-03-24 NOTE — Telephone Encounter (Signed)
RX reprinted. I'm not sure where the other one printed by CMA is?  Will fax after md signs

## 2014-03-27 ENCOUNTER — Ambulatory Visit: Payer: 59 | Admitting: Family Medicine

## 2014-04-02 ENCOUNTER — Ambulatory Visit: Payer: 59 | Admitting: Family Medicine

## 2014-04-21 ENCOUNTER — Ambulatory Visit: Payer: 59 | Admitting: Family Medicine

## 2014-05-08 ENCOUNTER — Ambulatory Visit (INDEPENDENT_AMBULATORY_CARE_PROVIDER_SITE_OTHER): Payer: 59 | Admitting: Family Medicine

## 2014-05-08 ENCOUNTER — Encounter: Payer: Self-pay | Admitting: Family Medicine

## 2014-05-08 VITALS — BP 129/85 | HR 74 | Temp 97.8°F | Ht 61.5 in | Wt 158.4 lb

## 2014-05-08 DIAGNOSIS — E213 Hyperparathyroidism, unspecified: Secondary | ICD-10-CM

## 2014-05-08 DIAGNOSIS — F419 Anxiety disorder, unspecified: Principal | ICD-10-CM

## 2014-05-08 DIAGNOSIS — E559 Vitamin D deficiency, unspecified: Secondary | ICD-10-CM

## 2014-05-08 DIAGNOSIS — F418 Other specified anxiety disorders: Secondary | ICD-10-CM

## 2014-05-08 DIAGNOSIS — Z23 Encounter for immunization: Secondary | ICD-10-CM

## 2014-05-08 DIAGNOSIS — E785 Hyperlipidemia, unspecified: Secondary | ICD-10-CM

## 2014-05-08 DIAGNOSIS — IMO0001 Reserved for inherently not codable concepts without codable children: Secondary | ICD-10-CM

## 2014-05-08 DIAGNOSIS — F32A Depression, unspecified: Secondary | ICD-10-CM

## 2014-05-08 DIAGNOSIS — K219 Gastro-esophageal reflux disease without esophagitis: Secondary | ICD-10-CM

## 2014-05-08 DIAGNOSIS — R030 Elevated blood-pressure reading, without diagnosis of hypertension: Secondary | ICD-10-CM

## 2014-05-08 DIAGNOSIS — E663 Overweight: Secondary | ICD-10-CM

## 2014-05-08 DIAGNOSIS — F329 Major depressive disorder, single episode, unspecified: Secondary | ICD-10-CM

## 2014-05-08 MED ORDER — ATORVASTATIN CALCIUM 10 MG PO TABS
10.0000 mg | ORAL_TABLET | Freq: Every day | ORAL | Status: DC
Start: 2014-05-08 — End: 2014-05-11

## 2014-05-08 MED ORDER — ALPRAZOLAM 0.5 MG PO TABS: ORAL_TABLET | ORAL | Status: DC

## 2014-05-08 MED ORDER — PANTOPRAZOLE SODIUM 40 MG PO TBEC: 40.0000 mg | DELAYED_RELEASE_TABLET | Freq: Every day | ORAL | Status: DC | PRN

## 2014-05-08 MED ORDER — SERTRALINE HCL 100 MG PO TABS: 150.0000 mg | ORAL_TABLET | Freq: Every day | ORAL | Status: DC

## 2014-05-08 NOTE — Progress Notes (Signed)
Pre visit review using our clinic review tool, if applicable. No additional management support is needed unless otherwise documented below in the visit note. 

## 2014-05-08 NOTE — Patient Instructions (Signed)

## 2014-05-09 ENCOUNTER — Encounter: Payer: Self-pay | Admitting: Family Medicine

## 2014-05-09 DIAGNOSIS — E559 Vitamin D deficiency, unspecified: Secondary | ICD-10-CM

## 2014-05-09 HISTORY — DX: Vitamin D deficiency, unspecified: E55.9

## 2014-05-09 LAB — HEPATIC FUNCTION PANEL
ALT: 31 U/L (ref 0–35)
AST: 36 U/L (ref 0–37)
Albumin: 4.3 g/dL (ref 3.5–5.2)
Alkaline Phosphatase: 84 U/L (ref 39–117)
BILIRUBIN DIRECT: 0.1 mg/dL (ref 0.0–0.3)
BILIRUBIN INDIRECT: 0.6 mg/dL (ref 0.2–1.2)
BILIRUBIN TOTAL: 0.7 mg/dL (ref 0.2–1.2)
Total Protein: 7 g/dL (ref 6.0–8.3)

## 2014-05-09 LAB — TSH: TSH: 2.196 u[IU]/mL (ref 0.350–4.500)

## 2014-05-09 LAB — VITAMIN D 25 HYDROXY (VIT D DEFICIENCY, FRACTURES): VIT D 25 HYDROXY: 25 ng/mL — AB (ref 30–89)

## 2014-05-09 LAB — RENAL FUNCTION PANEL
ALBUMIN: 4.3 g/dL (ref 3.5–5.2)
BUN: 23 mg/dL (ref 6–23)
CO2: 24 mEq/L (ref 19–32)
Calcium: 10.6 mg/dL — ABNORMAL HIGH (ref 8.4–10.5)
Chloride: 104 mEq/L (ref 96–112)
Creat: 0.84 mg/dL (ref 0.50–1.10)
Glucose, Bld: 91 mg/dL (ref 70–99)
Phosphorus: 2.6 mg/dL (ref 2.3–4.6)
Potassium: 4.7 mEq/L (ref 3.5–5.3)
SODIUM: 138 meq/L (ref 135–145)

## 2014-05-09 LAB — LIPID PANEL
Cholesterol: 278 mg/dL — ABNORMAL HIGH (ref 0–200)
HDL: 142 mg/dL (ref >39)
LDL CALC: 125 mg/dL — AB (ref 0–99)
Total CHOL/HDL Ratio: 2 Ratio
Triglycerides: 53 mg/dL (ref <150)
VLDL: 11 mg/dL (ref 0–40)

## 2014-05-09 NOTE — Assessment & Plan Note (Signed)
Mild, stable, will check PTH and monitor for symptoms of concern

## 2014-05-09 NOTE — Assessment & Plan Note (Signed)
Vitamin d def has improved some but still low. Will add Vitamin D 5000 IU daily

## 2014-05-09 NOTE — Assessment & Plan Note (Signed)
Tolerating statin, encouraged heart healthy diet, avoid trans fats, minimize simple carbs and saturated fats. Increase exercise as tolerated 

## 2014-05-09 NOTE — Progress Notes (Signed)
Patient ID: Sheryl [name], female   DOB: 1960/08/01, 53 y.o.   MRN: [id number] Sheryl [name] [id number] 01/07/1961 05/09/2014      Progress Note-Follow Up  Subjective  Chief Complaint  Chief Complaint  Patient presents with  . Follow-up    6 month  . Injections    flu    HPI  Patient is a 53 year old female in today for routine medical care. Patient is doing well. No recent illness. Moves bowwels 2-3 x a week. Agrees to flu shot today. Denies CP/palp/SOB/HA/congestion/fevers/GI or GU c/o. Taking meds as prescribed  Past Medical History  Diagnosis Date  . Chicken pox as a child  . Anxiety   . GERD (gastroesophageal reflux disease)   . Depression   . Overweight(278.02)   . Anemia   . Preventative health care 06/07/2011  . FH: colon cancer 06/07/2011  . Elevated BP 02/06/2012  . Hyperlipidemia 05/08/2012  . Sacral pain 05/08/2012  . Vitamin D deficiency 05/09/2014    Past Surgical History  Procedure Laterality Date  . Gum surgery  2007  . Right hand surgery  2009    infection debrided  . Cesarean section  1985 and 1989    X 2  . Colonoscopy      Family History  Problem Relation Age of Onset  . Heart attack Mother   . Diabetes Mother     type 2  . Hypertension Mother   . Hyperlipidemia Mother   . Cancer Father     colon  . Heart attack Father   . Colon cancer Father   . Hypertension Sister   . Diabetes Sister     type 2  . Hypertension Sister   . Diabetes Maternal Grandmother   . Hypertension Maternal Grandmother   . Hyperlipidemia Maternal Grandmother     History   Social History  . Marital Status: Married    Spouse Name: N/A    Number of Children: N/A  . Years of Education: N/A   Occupational History  . Not on file.   Social History Main Topics  . Smoking status: Never Smoker   . Smokeless tobacco: Never Used  . Alcohol Use: 7.0 oz/week    14 drink(s) per week     Comment: glass of wine nightly  . Drug Use: No  . Sexual Activity: Yes     Partners: Male     Comment: no dietary restrictions, lives with husband   Other Topics Concern  . Not on file   Social History Narrative  . No narrative on file    Current Outpatient Prescriptions on File Prior to Visit  Medication Sig Dispense Refill  . Coenzyme Q10 (CO Q 10 PO) Take 1 capsule by mouth daily.      Astrid Drafts CAPS 1 cap daily MegaRed by Schiff      . Multiple Vitamin (MULTIVITAMIN) capsule Take 1 capsule by mouth daily.        . [DISCONTINUED] FLUoxetine (PROZAC) 20 MG capsule Take 20 mg by mouth daily.         No current facility-administered medications on file prior to visit.    Allergies  Allergen Reactions  . Penicillins Rash    Review of Systems  Review of Systems  Constitutional: Negative for fever and malaise/fatigue.  HENT: Negative for congestion.   Eyes: Negative for discharge.  Respiratory: Negative for shortness of breath.   Cardiovascular: Negative for chest pain, palpitations and leg swelling.  Gastrointestinal:  Negative for nausea, abdominal pain and diarrhea.  Genitourinary: Negative for dysuria.  Musculoskeletal: Negative for falls.  Skin: Negative for rash.  Neurological: Negative for loss of consciousness and headaches.  Endo/Heme/Allergies: Negative for polydipsia.  Psychiatric/Behavioral: Negative for depression and suicidal ideas. The patient is not nervous/anxious and does not have insomnia.     Objective  BP 129/85  Pulse 74  Temp(Src) 97.8 F (36.6 C) (Oral)  Ht 5' 1.5" (1.562 m)  Wt 158 lb 6.4 oz (71.85 kg)  BMI 29.45 kg/m2  SpO2 100%  LMP 09/08/2011  Physical Exam  Physical Exam  Constitutional: She is oriented to person, place, and time and well-developed, well-nourished, and in no distress. No distress.  HENT:  Head: Normocephalic and atraumatic.  Eyes: Conjunctivae are normal.  Neck: Neck supple. No thyromegaly present.  Cardiovascular: Normal rate, regular rhythm and normal heart sounds.   No murmur  heard. Pulmonary/Chest: Effort normal and breath sounds normal. She has no wheezes.  Abdominal: She exhibits no distension and no mass.  Musculoskeletal: She exhibits no edema.  Lymphadenopathy:    She has no cervical adenopathy.  Neurological: She is alert and oriented to person, place, and time.  Skin: Skin is warm and dry. No rash noted. She is not diaphoretic.  Psychiatric: Memory, affect and judgment normal.    Lab Results  Component Value Date   TSH 2.196 05/08/2014   Lab Results  Component Value Date   WBC 3.9* 11/12/2013   HGB 15.7* 11/12/2013   HCT 45.5 11/12/2013   MCV 84.7 11/12/2013   PLT 178 11/12/2013   Lab Results  Component Value Date   CREATININE 0.84 05/08/2014   BUN 23 05/08/2014   NA 138 05/08/2014   K 4.7 05/08/2014   CL 104 05/08/2014   CO2 24 05/08/2014   Lab Results  Component Value Date   ALT 31 05/08/2014   AST 36 05/08/2014   ALKPHOS 84 05/08/2014   BILITOT 0.7 05/08/2014   Lab Results  Component Value Date   CHOL 278* 05/08/2014   Lab Results  Component Value Date   HDL 142 05/08/2014   Lab Results  Component Value Date   LDLCALC 125* 05/08/2014   Lab Results  Component Value Date   TRIG 53 05/08/2014   Lab Results  Component Value Date   CHOLHDL 2.0 05/08/2014     Assessment & Plan  GERD (gastroesophageal reflux disease) Avoid offending foods, start probiotics. Do not eat large meals in late evening and consider raising head of bed.   Hypercalcemia Mild, stable, will check PTH and monitor for symptoms of concern  Overweight Encouraged DASH diet, decrease po intake and increase exercise as tolerated. Needs 7-8 hours of sleep nightly. Avoid trans fats, eat small, frequent meals every 4-5 hours with lean proteins, complex carbs and healthy fats. Minimize simple carbs, GMO foods.  Elevated BP Well controlled. Encouraged heart healthy diet such as the DASH diet and exercise as tolerated.   Vitamin D deficiency Vitamin d def has improved  some but still low. Will add Vitamin D 5000 IU daily   Hyperlipidemia Tolerating statin, encouraged heart healthy diet, avoid trans fats, minimize simple carbs and saturated fats. Increase exercise as tolerated

## 2014-05-09 NOTE — Assessment & Plan Note (Signed)
Well controlled. Encouraged heart healthy diet such as the DASH diet and exercise as tolerated.  

## 2014-05-09 NOTE — Assessment & Plan Note (Signed)
Avoid offending foods, start probiotics. Do not eat large meals in late evening and consider raising head of bed.  

## 2014-05-09 NOTE — Assessment & Plan Note (Signed)
Encouraged DASH diet, decrease po intake and increase exercise as tolerated. Needs 7-8 hours of sleep nightly. Avoid trans fats, eat small, frequent meals every 4-5 hours with lean proteins, complex carbs and healthy fats. Minimize simple carbs, GMO foods. 

## 2014-05-11 ENCOUNTER — Telehealth: Payer: Self-pay

## 2014-05-11 ENCOUNTER — Encounter: Payer: Self-pay | Admitting: Family Medicine

## 2014-05-11 ENCOUNTER — Other Ambulatory Visit: Payer: Self-pay | Admitting: Family Medicine

## 2014-05-11 DIAGNOSIS — E213 Hyperparathyroidism, unspecified: Secondary | ICD-10-CM

## 2014-05-11 DIAGNOSIS — E559 Vitamin D deficiency, unspecified: Secondary | ICD-10-CM

## 2014-05-11 LAB — CBC

## 2014-05-11 LAB — PARATHYROID HORMONE, INTACT (NO CA): PTH: 238 pg/mL — AB (ref 14–64)

## 2014-05-11 MED ORDER — ATORVASTATIN CALCIUM 20 MG PO TABS: 20.0000 mg | ORAL_TABLET | Freq: Every day | ORAL | Status: DC

## 2014-05-11 NOTE — Telephone Encounter (Signed)
Left a detailed message on patients vm.  Lab order placed and Vitamin D added to med list

## 2014-05-11 NOTE — Addendum Note (Signed)
Addended by: Gardenia Phlegm L on: 05/11/2014 12:00 PM   Modules accepted: Orders

## 2014-05-11 NOTE — Telephone Encounter (Signed)
Message copied by Varney Daily on Mon May 11, 2014  9:42 AM ------      Message from: Penni Homans A      Created: Sat May 09, 2014  9:06 PM       Needs to start Vitamin d 5000 IU daily OTC recheck in 3 months ------

## 2014-05-13 ENCOUNTER — Telehealth: Payer: Self-pay | Admitting: *Deleted

## 2014-05-13 NOTE — Telephone Encounter (Signed)
Message copied by Harl Bowie on Wed May 13, 2014 10:51 AM ------      Message from: Penni Homans A      Created: Tue May 12, 2014 12:50 PM      Regarding: RE: CBC not ran       So it is not greatly urgent but her CBC was mildly abnormal last draw. Did I order it wrong for future reference? If she is willing to come back in at her convenience to run it that would be great but would not charge her another lab fee. If not I will just check at next visit.Thanks. Dr B      ----- Message -----         From: Harl Bowie, CMA         Sent: 05/12/2014  10:37 AM           To: Mosie Lukes, MD      Subject: CBC not ran                                              Received call from [facility] stating that CBC was not ran because the tube came into the lab without requisition.  When they found the tube it was to late to run the CBC.  Would like for me to call the pt and have her come back in to have the CBC repeated.       ------

## 2014-05-13 NOTE — Telephone Encounter (Signed)
Yes the test was ordered correctly.  Spoke with the pt and she stated that she will like to wait until her next visit to have the CBC redrawn.//AB/CMA

## 2014-05-27 ENCOUNTER — Encounter: Payer: Self-pay | Admitting: Internal Medicine

## 2014-10-21 ENCOUNTER — Telehealth: Payer: Self-pay | Admitting: Family Medicine

## 2014-10-21 NOTE — Telephone Encounter (Signed)
pre visit letter sent °

## 2014-11-09 ENCOUNTER — Telehealth: Payer: Self-pay | Admitting: *Deleted

## 2014-11-09 ENCOUNTER — Encounter: Payer: Self-pay | Admitting: *Deleted

## 2014-11-09 NOTE — Telephone Encounter (Signed)
Pre-Visit Call completed with patient and chart updated.   Pre-Visit Info documented in Specialty Comments under [employer].    

## 2014-11-10 ENCOUNTER — Emergency Department (HOSPITAL_BASED_OUTPATIENT_CLINIC_OR_DEPARTMENT_OTHER): Payer: 59

## 2014-11-10 ENCOUNTER — Encounter (HOSPITAL_BASED_OUTPATIENT_CLINIC_OR_DEPARTMENT_OTHER): Payer: Self-pay | Admitting: Emergency Medicine

## 2014-11-10 ENCOUNTER — Ambulatory Visit (INDEPENDENT_AMBULATORY_CARE_PROVIDER_SITE_OTHER): Payer: 59 | Admitting: Family Medicine

## 2014-11-10 ENCOUNTER — Other Ambulatory Visit: Payer: Self-pay

## 2014-11-10 ENCOUNTER — Encounter: Payer: Self-pay | Admitting: Family Medicine

## 2014-11-10 ENCOUNTER — Emergency Department (HOSPITAL_BASED_OUTPATIENT_CLINIC_OR_DEPARTMENT_OTHER)
Admission: EM | Admit: 2014-11-10 | Discharge: 2014-11-10 | Disposition: A | Discharge status: Home / Self Care | Payer: 59 | Attending: Emergency Medicine | Admitting: Emergency Medicine

## 2014-11-10 VITALS — BP 110/76 | HR 144 | Temp 98.2°F | Resp 18 | Ht 62.0 in | Wt 159.0 lb

## 2014-11-10 DIAGNOSIS — Z0000 Encounter for general adult medical examination without abnormal findings: Secondary | ICD-10-CM

## 2014-11-10 DIAGNOSIS — E559 Vitamin D deficiency, unspecified: Secondary | ICD-10-CM | POA: Insufficient documentation

## 2014-11-10 DIAGNOSIS — F419 Anxiety disorder, unspecified: Secondary | ICD-10-CM | POA: Insufficient documentation

## 2014-11-10 DIAGNOSIS — Z880 Allergy status to penicillin: Secondary | ICD-10-CM | POA: Diagnosis not present

## 2014-11-10 DIAGNOSIS — E785 Hyperlipidemia, unspecified: Secondary | ICD-10-CM | POA: Insufficient documentation

## 2014-11-10 DIAGNOSIS — Z8619 Personal history of other infectious and parasitic diseases: Secondary | ICD-10-CM | POA: Insufficient documentation

## 2014-11-10 DIAGNOSIS — R030 Elevated blood-pressure reading, without diagnosis of hypertension: Secondary | ICD-10-CM

## 2014-11-10 DIAGNOSIS — D649 Anemia, unspecified: Secondary | ICD-10-CM | POA: Insufficient documentation

## 2014-11-10 DIAGNOSIS — I471 Supraventricular tachycardia, unspecified: Secondary | ICD-10-CM

## 2014-11-10 DIAGNOSIS — F418 Other specified anxiety disorders: Secondary | ICD-10-CM | POA: Diagnosis not present

## 2014-11-10 DIAGNOSIS — R000 Tachycardia, unspecified: Secondary | ICD-10-CM | POA: Diagnosis present

## 2014-11-10 DIAGNOSIS — E663 Overweight: Secondary | ICD-10-CM | POA: Diagnosis not present

## 2014-11-10 DIAGNOSIS — K219 Gastro-esophageal reflux disease without esophagitis: Secondary | ICD-10-CM | POA: Insufficient documentation

## 2014-11-10 DIAGNOSIS — Z79899 Other long term (current) drug therapy: Secondary | ICD-10-CM | POA: Insufficient documentation

## 2014-11-10 DIAGNOSIS — F329 Major depressive disorder, single episode, unspecified: Secondary | ICD-10-CM | POA: Diagnosis not present

## 2014-11-10 DIAGNOSIS — F32A Depression, unspecified: Secondary | ICD-10-CM

## 2014-11-10 DIAGNOSIS — IMO0001 Reserved for inherently not codable concepts without codable children: Secondary | ICD-10-CM

## 2014-11-10 HISTORY — DX: Tachycardia, unspecified: R00.0

## 2014-11-10 LAB — CBC
HEMATOCRIT: 45.1 % (ref 36.0–46.0)
HEMOGLOBIN: 15.3 g/dL — AB (ref 12.0–15.0)
MCH: 29.7 pg (ref 26.0–34.0)
MCHC: 33.9 g/dL (ref 30.0–36.0)
MCV: 87.6 fL (ref 78.0–100.0)
PLATELETS: 140 10*3/uL — AB (ref 150–400)
RBC: 5.15 MIL/uL — AB (ref 3.87–5.11)
RDW: 13.3 % (ref 11.5–15.5)
WBC: 4.2 10*3/uL (ref 4.0–10.5)

## 2014-11-10 LAB — BASIC METABOLIC PANEL
ANION GAP: 7 (ref 5–15)
BUN: 20 mg/dL (ref 6–23)
CO2: 25 mmol/L (ref 19–32)
CREATININE: 0.91 mg/dL (ref 0.50–1.10)
Calcium: 10.5 mg/dL (ref 8.4–10.5)
Chloride: 105 mmol/L (ref 96–112)
GFR calc Af Amer: 83 mL/min — ABNORMAL LOW (ref >90)
GFR calc non Af Amer: 72 mL/min — ABNORMAL LOW (ref >90)
GLUCOSE: 95 mg/dL (ref 70–99)
Potassium: 4.5 mmol/L (ref 3.5–5.1)
Sodium: 137 mmol/L (ref 135–145)

## 2014-11-10 LAB — TROPONIN I: Troponin I: <0.03 ng/mL (ref <0.031)

## 2014-11-10 MED ORDER — ASPIRIN 81 MG PO CHEW
324.0000 mg | CHEWABLE_TABLET | Freq: Once | ORAL | Status: AC
  Administered 2014-11-10: 324 mg via ORAL
  Filled 2014-11-10: qty 4

## 2014-11-10 MED ORDER — ATORVASTATIN CALCIUM 20 MG PO TABS: 20.0000 mg | ORAL_TABLET | Freq: Every day | ORAL | Status: DC

## 2014-11-10 MED ORDER — SODIUM CHLORIDE 0.9 % IV BOLUS (SEPSIS)
1000.0000 mL | Freq: Once | INTRAVENOUS | Status: AC
  Administered 2014-11-10: 1000 mL via INTRAVENOUS

## 2014-11-10 MED ORDER — ALPRAZOLAM 0.5 MG PO TABS: ORAL_TABLET | ORAL | Status: DC

## 2014-11-10 MED ORDER — PANTOPRAZOLE SODIUM 40 MG PO TBEC: 40.0000 mg | DELAYED_RELEASE_TABLET | Freq: Every day | ORAL | Status: DC | PRN

## 2014-11-10 MED ORDER — SERTRALINE HCL 100 MG PO TABS: 150.0000 mg | ORAL_TABLET | Freq: Every day | ORAL | Status: DC

## 2014-11-10 NOTE — Progress Notes (Signed)
Sheryl [name]  [id number] Nov 16, 1960 11/10/2014      Progress Note-Follow Up  Subjective  Chief Complaint  Chief Complaint  Patient presents with  . Annual Exam    HPI  Patient is a 54 y.o. female in today for routine medical care. Patient in today for annual exam. Reports she has been in her usual state of health. No recent illness. Drank less than a cup of coffee this morning. No poor sleep last night or increased stressors. Denies any recent diaphoresis, n/v/cp/sob/palp. No GI or GU c/o  Past Medical History  Diagnosis Date  . Chicken pox as a child  . Anxiety   . GERD (gastroesophageal reflux disease)   . Depression   . Overweight(278.02)   . Anemia   . Preventative health care 06/07/2011  . FH: colon cancer 06/07/2011  . Elevated BP 02/06/2012  . Hyperlipidemia 05/08/2012  . Sacral pain 05/08/2012  . Vitamin D deficiency 05/09/2014  . Tachycardia 11/10/2014    Past Surgical History  Procedure Laterality Date  . Gum surgery  2007  . Right hand surgery  2009    infection debrided  . Cesarean section  1985 and 1989    X 2  . Colonoscopy      Family History  Problem Relation Age of Onset  . Heart attack Mother   . Diabetes Mother     type 2  . Hypertension Mother   . Hyperlipidemia Mother   . Cancer Father     colon  . Heart attack Father   . Colon cancer Father   . Hypertension Sister   . Diabetes Sister     type 2  . Hypertension Sister   . Diabetes Maternal Grandmother   . Hypertension Maternal Grandmother   . Hyperlipidemia Maternal Grandmother     History   Social History  . Marital Status: Married    Spouse Name: N/A  . Number of Children: N/A  . Years of Education: N/A   Occupational History  . Not on file.   Social History Main Topics  . Smoking status: Never Smoker   . Smokeless tobacco: Never Used  . Alcohol Use: 7.0 oz/week    14 drink(s) per week     Comment: glass of wine nightly  . Drug Use: No  . Sexual Activity:   Partners: Male     Comment: no dietary restrictions, lives with husband   Other Topics Concern  . Not on file   Social History Narrative    Current Outpatient Prescriptions on File Prior to Visit  Medication Sig Dispense Refill  . Coenzyme Q10 (CO Q 10 PO) Take 1 capsule by mouth daily.    Astrid Drafts CAPS 1 cap daily MegaRed by Schiff    . Multiple Vitamin (MULTIVITAMIN) capsule Take 1 capsule by mouth daily.      Marland Kitchen VITAMIN D, ERGOCALCIFEROL, PO Take by mouth. Take 5000 IU daily    . [DISCONTINUED] FLUoxetine (PROZAC) 20 MG capsule Take 20 mg by mouth daily.       No current facility-administered medications on file prior to visit.    Allergies  Allergen Reactions  . Penicillins Rash    Review of Systems  Review of Systems  Constitutional: Negative for fever and malaise/fatigue.  HENT: Negative for congestion.   Eyes: Negative for discharge.  Respiratory: Negative for shortness of breath.   Cardiovascular: Negative for chest pain, palpitations and leg swelling.  Gastrointestinal: Negative for nausea, abdominal pain and  diarrhea.  Genitourinary: Negative for dysuria.  Musculoskeletal: Negative for falls.  Skin: Negative for rash.  Neurological: Negative for loss of consciousness and headaches.  Endo/Heme/Allergies: Negative for polydipsia.  Psychiatric/Behavioral: Negative for depression and suicidal ideas. The patient is nervous/anxious. The patient does not have insomnia.     Objective  BP 110/76 mmHg  Pulse 144  Temp(Src) 98.2 F (36.8 C) (Oral)  Resp 18  Ht 5\' 2"  (1.575 m)  Wt 159 lb (72.122 kg)  BMI 29.07 kg/m2  SpO2 99%  LMP 09/08/2011  Physical Exam  Physical Exam  Constitutional: She is oriented to person, place, and time and well-developed, well-nourished, and in no distress. No distress.  HENT:  Head: Normocephalic and atraumatic.  Eyes: Conjunctivae are normal.  Neck: Neck supple. No thyromegaly present.  Cardiovascular: Regular rhythm and  normal heart sounds.   No murmur heard. tachycardia  Pulmonary/Chest: Effort normal and breath sounds normal. She has no wheezes.  Abdominal: She exhibits no distension and no mass.  Musculoskeletal: She exhibits no edema.  Lymphadenopathy:    She has no cervical adenopathy.  Neurological: She is alert and oriented to person, place, and time.  Skin: Skin is warm and dry. No rash noted. She is not diaphoretic.  Psychiatric: Memory, affect and judgment normal.    Lab Results  Component Value Date   TSH 2.196 05/08/2014   Lab Results  Component Value Date   WBC CANCELED 05/08/2014   HGB CANCELED 05/08/2014   HCT CANCELED 05/08/2014   MCV CANCELED 05/08/2014   PLT CANCELED 05/08/2014   Lab Results  Component Value Date   CREATININE 0.84 05/08/2014   BUN 23 05/08/2014   NA 138 05/08/2014   K 4.7 05/08/2014   CL 104 05/08/2014   CO2 24 05/08/2014   Lab Results  Component Value Date   ALT 31 05/08/2014   AST 36 05/08/2014   ALKPHOS 84 05/08/2014   BILITOT 0.7 05/08/2014   Lab Results  Component Value Date   CHOL 278* 05/08/2014   Lab Results  Component Value Date   HDL 142 05/08/2014   Lab Results  Component Value Date   LDLCALC 125* 05/08/2014   Lab Results  Component Value Date   TRIG 53 05/08/2014   Lab Results  Component Value Date   CHOLHDL 2.0 05/08/2014     Assessment & Plan  Tachycardia Unable to get pulse down, running at 148 on EKG, no recent sense of palpitations or chest pain. As well do her EKG she complains of left arm pain but no other c/o. Very anxious. Will send to ED for medications, stat labs and consideration of admission if does not improve   Elevated BP Well controlled. Encouraged heart healthy diet such as the DASH diet and exercise as tolerated.    Preventative health care Unable to perform due to persistent tachycardia. Will reschedule once pulse is corrected.

## 2014-11-10 NOTE — ED Notes (Signed)
Pt having a physical upstairs when staff noticed HR in the 140s. Has left arm non radiating pain. Pt is a/o x4

## 2014-11-10 NOTE — Assessment & Plan Note (Signed)
Well controlled. Encouraged heart healthy diet such as the DASH diet and exercise as tolerated.  

## 2014-11-10 NOTE — ED Notes (Signed)
MD at bedside. 

## 2014-11-10 NOTE — ED Notes (Signed)
Pt converted back to NSR while EDP in the room. HR now 91

## 2014-11-10 NOTE — Assessment & Plan Note (Signed)
Unable to perform due to persistent tachycardia. Will reschedule once pulse is corrected.

## 2014-11-10 NOTE — Assessment & Plan Note (Addendum)
Unable to get pulse down, running at 148 on EKG, no recent sense of palpitations or chest pain. As well do her EKG she complains of left arm pain but no other c/o. Very anxious. Will send to ED for medications, stat labs and consideration of admission if does not improve

## 2014-11-10 NOTE — ED Notes (Signed)
MD at bedside. 

## 2014-11-10 NOTE — ED Provider Notes (Signed)
CSN: [id number]     Arrival date & time 11/10/14  1015 History   First MD Initiated Contact with Patient 11/10/14 1025     Chief Complaint  Patient presents with  . Tachycardia     (Consider location/radiation/quality/duration/timing/severity/associated sxs/prior Treatment) HPI 54 year old female presents from her doctor's office with tachycardia. Patient was here for a regular checkup and was noted to have a heart rate in the 140s. Patient states she is a symptomatically from this. Has not had any palpitations, chest pain, or shortness of breath. Denies feeling lightheaded or dizzy. No prior cardiac history or palpitations history. She is complaining of left arm pain that comes and goes. States it is been present for at least the last week and a half and she thought she slept on it wrong. Not worse with exertion. Not present now.  Past Medical History  Diagnosis Date  . Chicken pox as a child  . Anxiety   . GERD (gastroesophageal reflux disease)   . Depression   . Overweight(278.02)   . Anemia   . Preventative health care 06/07/2011  . FH: colon cancer 06/07/2011  . Elevated BP 02/06/2012  . Hyperlipidemia 05/08/2012  . Sacral pain 05/08/2012  . Vitamin D deficiency 05/09/2014  . Tachycardia 11/10/2014   Past Surgical History  Procedure Laterality Date  . Gum surgery  2007  . Right hand surgery  2009    infection debrided  . Cesarean section  1985 and 1989    X 2  . Colonoscopy     Family History  Problem Relation Age of Onset  . Heart attack Mother   . Diabetes Mother     type 2  . Hypertension Mother   . Hyperlipidemia Mother   . Cancer Father     colon  . Heart attack Father   . Colon cancer Father   . Hypertension Sister   . Diabetes Sister     type 2  . Hypertension Sister   . Diabetes Maternal Grandmother   . Hypertension Maternal Grandmother   . Hyperlipidemia Maternal Grandmother    History  Substance Use Topics  . Smoking status: Never Smoker   .  Smokeless tobacco: Never Used  . Alcohol Use: 7.0 oz/week    14 drink(s) per week     Comment: glass of wine nightly   OB History    No data available     Review of Systems  Constitutional: Negative for fever.  Respiratory: Negative for shortness of breath.   Cardiovascular: Negative for chest pain and palpitations.  Neurological: Negative for dizziness, syncope and light-headedness.  All other systems reviewed and are negative.     Allergies  Penicillins  Home Medications   Prior to Admission medications   Medication Sig Start Date End Date Taking? Authorizing Provider  ALPRAZolam Duanne Moron) 0.5 MG tablet Take 1/2-1 tab po bid prn for anxiety 11/10/14   Mosie Lukes, MD  atorvastatin (LIPITOR) 20 MG tablet Take 1 tablet (20 mg total) by mouth daily. 11/10/14   Mosie Lukes, MD  Coenzyme Q10 (CO Q 10 PO) Take 1 capsule by mouth daily.    Historical Provider, MD  Astrid Drafts CAPS 1 cap daily MegaRed by Schiff 02/06/12   Mosie Lukes, MD  Multiple Vitamin (MULTIVITAMIN) capsule Take 1 capsule by mouth daily.      Historical Provider, MD  pantoprazole (PROTONIX) 40 MG tablet Take 1 tablet (40 mg total) by mouth daily as needed. 11/10/14   Erline Levine  Eric Form, MD  sertraline (ZOLOFT) 100 MG tablet Take 1.5 tablets (150 mg total) by mouth daily. 11/10/14   Mosie Lukes, MD  VITAMIN D, ERGOCALCIFEROL, PO Take by mouth. Take 5000 IU daily    Historical Provider, MD   BP 145/101 mmHg  Pulse 148  Temp(Src) 98.4 F (36.9 C) (Oral)  Resp 20  Ht 5\' 2"  (1.575 m)  Wt 160 lb (72.576 kg)  BMI 29.26 kg/m2  SpO2 97%  LMP 09/08/2011 Physical Exam  Constitutional: She is oriented to person, place, and time. She appears well-developed and well-nourished. No distress.  HENT:  Head: Normocephalic and atraumatic.  Right Ear: External ear normal.  Left Ear: External ear normal.  Nose: Nose normal.  Eyes: Right eye exhibits no discharge. Left eye exhibits no discharge.  Neck: Neck supple.   Cardiovascular: Regular rhythm and normal heart sounds.  Tachycardia present.   Pulses:      Radial pulses are 2+ on the right side, and 2+ on the left side.  Pulmonary/Chest: Effort normal and breath sounds normal.  Abdominal: Soft. There is no tenderness.  Neurological: She is alert and oriented to person, place, and time.  Skin: Skin is warm and dry. She is not diaphoretic.  Nursing note and vitals reviewed.   ED Course  Procedures (including critical care time) Labs Review Labs Reviewed  CBC - Abnormal; Notable for the following:    RBC 5.15 (*)    Hemoglobin 15.3 (*)    Platelets 140 (*)    All other components within normal limits  BASIC METABOLIC PANEL - Abnormal; Notable for the following:    GFR calc non Af Amer 72 (*)    GFR calc Af Amer 83 (*)    All other components within normal limits  TROPONIN I    Imaging Review Dg Chest Port 1 View  11/10/2014   CLINICAL DATA:  Tachycardia.  EXAM: PORTABLE CHEST - 1 VIEW  COMPARISON:  None.  FINDINGS: The heart size and mediastinal contours are within normal limits. Both lungs are clear. No pneumothorax or pleural effusion is noted. The visualized skeletal structures are unremarkable.  IMPRESSION: No acute cardiopulmonary abnormality seen.   Electronically Signed   By: Marijo Conception, M.D.   On: 11/10/2014 10:56     EKG Interpretation   Date/Time:  Tuesday November 10 2014 10:20:55 EDT Ventricular Rate:  146 PR Interval:  232 QRS Duration: 64 QT Interval:  236 QTC Calculation: 367 R Axis:   60 Text Interpretation:  Tachycardia with unspecified narrow complex rhythm  Nonspecific ST abnormality Abnormal QRS-T angle, consider primary T wave  abnormality Abnormal ECG changes compared to 2004 Confirmed by Merridith Dershem   MD, [place] ([id number]) on 11/10/2014 10:26:36 AM      EKG#2  EKG Interpretation  Date/Time:  Tuesday November 10 2014 10:35:29 EDT Ventricular Rate:  85 PR Interval:  116 QRS Duration: 72 QT Interval:  346 QTC  Calculation: 411 R Axis:   52 Text Interpretation:  Normal sinus rhythm Normal ECG rate has slowed to normal since earlier in the day Confirmed by [place] ([id number]) on 11/10/2014 10:36:58 AM       MDM   Final diagnoses:  SVT (supraventricular tachycardia)    I believe patient's tachycardia was an asymptomatic SVT. While I was examining patient her heart rate abruptly converted back to normal sinus rhythm. Her left arm pain is atypical and his been going on on and off for the past 1-2  weeks. It feels like a muscle ache. Not present at this time. I doubt she is having ACS. Does not appear to correlate with the heart rate. Lab work is unremarkable. I discussed these results with her PCP, Dr. Charlett Blake, was the patient to come back upstairs to the clinic to arrange close follow-up. Strict return precautions with the patient.    Sherwood Gambler, MD 11/10/14 1409

## 2014-11-10 NOTE — ED Notes (Signed)
Denies SOB/CHX Pain. States "I don't even feel it."

## 2014-11-11 ENCOUNTER — Telehealth: Payer: Self-pay | Admitting: Family Medicine

## 2014-11-11 ENCOUNTER — Other Ambulatory Visit: Payer: Self-pay | Admitting: Family Medicine

## 2014-11-11 DIAGNOSIS — R000 Tachycardia, unspecified: Secondary | ICD-10-CM

## 2014-11-11 DIAGNOSIS — E782 Mixed hyperlipidemia: Secondary | ICD-10-CM

## 2014-11-11 NOTE — Telephone Encounter (Signed)
Caller name: kim  Relation to pt: self Call back number: [phone] Pharmacy:  Reason for call:   Patient has to cancel ED follow up with Dr. Charlett Blake for tomorrow and would like to know if one of the other providers would see her since Dr. Charlett Blake does not have any other availability

## 2014-11-11 NOTE — Telephone Encounter (Signed)
ED follow up scheduled for Monday, 4/18

## 2014-11-11 NOTE — Telephone Encounter (Signed)
I have no ER follow-up slots today and I am off on Thursday.  She is welcome to see me on Friday or another provider sooner if they can fit her in.

## 2014-11-12 ENCOUNTER — Ambulatory Visit: Payer: 59 | Admitting: Family Medicine

## 2014-11-16 ENCOUNTER — Ambulatory Visit (INDEPENDENT_AMBULATORY_CARE_PROVIDER_SITE_OTHER): Payer: 59 | Admitting: Physician Assistant

## 2014-11-16 ENCOUNTER — Encounter: Payer: Self-pay | Admitting: Physician Assistant

## 2014-11-16 VITALS — BP 122/86 | HR 84 | Temp 98.4°F | Resp 16 | Ht 62.0 in | Wt 159.0 lb

## 2014-11-16 DIAGNOSIS — E782 Mixed hyperlipidemia: Secondary | ICD-10-CM | POA: Diagnosis not present

## 2014-11-16 DIAGNOSIS — R000 Tachycardia, unspecified: Secondary | ICD-10-CM

## 2014-11-16 LAB — COMPREHENSIVE METABOLIC PANEL
ALT: 30 U/L (ref 0–35)
AST: 36 U/L (ref 0–37)
Albumin: 4.1 g/dL (ref 3.5–5.2)
Alkaline Phosphatase: 111 U/L (ref 39–117)
BILIRUBIN TOTAL: 0.6 mg/dL (ref 0.2–1.2)
BUN: 19 mg/dL (ref 6–23)
CO2: 27 mEq/L (ref 19–32)
CREATININE: 0.9 mg/dL (ref 0.40–1.20)
Calcium: 10.8 mg/dL — ABNORMAL HIGH (ref 8.4–10.5)
Chloride: 106 mEq/L (ref 96–112)
GFR: 70 mL/min (ref >60.00)
Glucose, Bld: 107 mg/dL — ABNORMAL HIGH (ref 70–99)
Potassium: 4.3 mEq/L (ref 3.5–5.1)
Sodium: 137 mEq/L (ref 135–145)
Total Protein: 6.8 g/dL (ref 6.0–8.3)

## 2014-11-16 LAB — LIPID PANEL
Cholesterol: 265 mg/dL — ABNORMAL HIGH (ref 0–200)
HDL: 133.7 mg/dL (ref >39.00)
LDL Cholesterol: 118 mg/dL — ABNORMAL HIGH (ref 0–99)
NonHDL: 131.3
Total CHOL/HDL Ratio: 2
Triglycerides: 65 mg/dL (ref 0.0–149.0)
VLDL: 13 mg/dL (ref 0.0–40.0)

## 2014-11-16 LAB — TSH: TSH: 2.68 u[IU]/mL (ref 0.35–4.50)

## 2014-11-16 LAB — MAGNESIUM: MAGNESIUM: 2.3 mg/dL (ref 1.5–2.5)

## 2014-11-16 LAB — VITAMIN D 25 HYDROXY (VIT D DEFICIENCY, FRACTURES): VITD: 22.76 ng/mL — ABNORMAL LOW (ref 30.00–100.00)

## 2014-11-16 LAB — T4, FREE: Free T4: 0.84 ng/dL (ref 0.60–1.60)

## 2014-11-16 NOTE — Progress Notes (Signed)
Patient presents to clinic today for ER follow-up of idiopathic tachycardia. Was evaluated in office at routine visit.  Pulse noted to be at 144 bpm.  Was not improving. EKG revealed SVt at rate of 146 bpm.  Patient sent to ER for further assessment. In ER room, tachycardia spontaneously resolved without intervention.  Patient was monitored and then discharged.  Patient denies recurrence of symptoms. Patient denies chest pain, palpitations, lightheadedness, dizziness, vision changes or frequent headaches. Has been trying to stay well hydrated. Is still waiting on appointment with Cardiology.  Past Medical History  Diagnosis Date  . Chicken pox as a child  . Anxiety   . GERD (gastroesophageal reflux disease)   . Depression   . Overweight(278.02)   . Anemia   . Preventative health care 06/07/2011  . FH: colon cancer 06/07/2011  . Elevated BP 02/06/2012  . Hyperlipidemia 05/08/2012  . Sacral pain 05/08/2012  . Vitamin D deficiency 05/09/2014  . Tachycardia 11/10/2014    Current Outpatient Prescriptions on File Prior to Visit  Medication Sig Dispense Refill  . ALPRAZolam (XANAX) 0.5 MG tablet Take 1/2-1 tab po bid prn for anxiety 60 tablet 3  . Coenzyme Q10 (CO Q 10 PO) Take 1 capsule by mouth daily.    Astrid Drafts CAPS 1 cap daily MegaRed by Schiff    . Multiple Vitamin (MULTIVITAMIN) capsule Take 1 capsule by mouth daily.      . pantoprazole (PROTONIX) 40 MG tablet Take 1 tablet (40 mg total) by mouth daily as needed. 90 tablet 1  . sertraline (ZOLOFT) 100 MG tablet Take 1.5 tablets (150 mg total) by mouth daily. 135 tablet 1  . VITAMIN D, ERGOCALCIFEROL, PO Take by mouth. Take 5000 IU daily    . atorvastatin (LIPITOR) 20 MG tablet Take 1 tablet (20 mg total) by mouth daily. (Patient not taking: Reported on 11/16/2014) 90 tablet 1  . [DISCONTINUED] FLUoxetine (PROZAC) 20 MG capsule Take 20 mg by mouth daily.       No current facility-administered medications on file prior to visit.     Allergies  Allergen Reactions  . Penicillins Rash    Family History  Problem Relation Age of Onset  . Heart attack Mother   . Diabetes Mother     type 2  . Hypertension Mother   . Hyperlipidemia Mother   . Cancer Father     colon  . Heart attack Father   . Colon cancer Father   . Hypertension Sister   . Diabetes Sister     type 2  . Hypertension Sister   . Diabetes Maternal Grandmother   . Hypertension Maternal Grandmother   . Hyperlipidemia Maternal Grandmother     History   Social History  . Marital Status: Married    Spouse Name: N/A  . Number of Children: N/A  . Years of Education: N/A   Social History Main Topics  . Smoking status: Never Smoker   . Smokeless tobacco: Never Used  . Alcohol Use: 7.0 oz/week    14 drink(s) per week     Comment: glass of wine nightly  . Drug Use: No  . Sexual Activity:    Partners: Male     Comment: no dietary restrictions, lives with husband   Other Topics Concern  . None   Social History Narrative    Review of Systems - See HPI.  All other ROS are negative.  BP 122/86 mmHg  Pulse 84  Temp(Src) 98.4 F (36.9  C) (Oral)  Resp 16  Ht _0  (1.575 m)  Wt 159 lb (72.122 kg)  BMI 29.07 kg/m2  SpO2 100%  LMP 09/08/2011  Physical Exam  Constitutional: She is oriented to person, place, and time and well-developed, well-nourished, and in no distress.  HENT:  Head: Normocephalic and atraumatic.  Eyes: Conjunctivae are normal.  Neck: Neck supple. No thyromegaly present.  Cardiovascular: Normal rate, regular rhythm, normal heart sounds and intact distal pulses.   Pulmonary/Chest: Effort normal and breath sounds normal. No respiratory distress. She has no wheezes. She has no rales. She exhibits no tenderness.  Neurological: She is alert and oriented to person, place, and time.  Skin: Skin is warm and dry. No rash noted.  Psychiatric: Affect normal.  Vitals reviewed.   Recent Results (from the past 2160 hour(s))   Troponin I ([facility])     Status: None   Collection Time: 11/10/14 10:35 AM  Result Value Ref Range   Troponin I <0.03 <0.031 ng/mL    Comment:        NO INDICATION OF MYOCARDIAL INJURY.   CBC     Status: Abnormal   Collection Time: 11/10/14 10:35 AM  Result Value Ref Range   WBC 4.2 4.0 - 10.5 K/uL   RBC 5.15 (H) 3.87 - 5.11 MIL/uL   Hemoglobin 15.3 (H) 12.0 - 15.0 g/dL   HCT 45.1 36.0 - 46.0 %   MCV 87.6 78.0 - 100.0 fL   MCH 29.7 26.0 - 34.0 pg   MCHC 33.9 30.0 - 36.0 g/dL   RDW 13.3 11.5 - 15.5 %   Platelets 140 (L) 150 - 400 K/uL  Basic metabolic panel     Status: Abnormal   Collection Time: 11/10/14 10:35 AM  Result Value Ref Range   Sodium 137 135 - 145 mmol/L   Potassium 4.5 3.5 - 5.1 mmol/L   Chloride 105 96 - 112 mmol/L   CO2 25 19 - 32 mmol/L   Glucose, Bld 95 70 - 99 mg/dL   BUN 20 6 - 23 mg/dL   Creatinine, Ser 0.91 0.50 - 1.10 mg/dL   Calcium 10.5 8.4 - 10.5 mg/dL   GFR calc non Af Amer 72 (L) >90 mL/min   GFR calc Af Amer 83 (L) >90 mL/min    Comment: (NOTE) The eGFR has been calculated using the CKD EPI equation. This calculation has not been validated in all clinical situations. eGFR's persistently <90 mL/min signify possible Chronic Kidney Disease.    Anion gap 7 5 - 15    Assessment/Plan: Tachycardia ER workup unremarkable.  Heart rate spontaneously normalized in ER setting.  Patient denies recurrence.  Vitals and examination within normal limits. Will check TSH and CBC today.  Orders already in system. Encouraged patient to stay well hydrated and not skip meals.  Limit caffeine intake.  Follow-up with Cardiology as scheduled.

## 2014-11-16 NOTE — Patient Instructions (Signed)
Please stay well hydrated and remember not to skip meals. Limit caffeine intake. Schedule an appointment with Dr. Charlett Blake for a CPE.  Her next appointment is May 10th at 9:30.   Cardiology has attempted to contact you for an appointment. Please give them a call at [phone] to schedule appointment.

## 2014-11-16 NOTE — Progress Notes (Signed)
Pre visit review using our clinic review tool, if applicable. No additional management support is needed unless otherwise documented below in the visit note/SLS  

## 2014-11-16 NOTE — Assessment & Plan Note (Signed)
ER workup unremarkable.  Heart rate spontaneously normalized in ER setting.  Patient denies recurrence.  Vitals and examination within normal limits. Will check TSH and CBC today.  Orders already in system. Encouraged patient to stay well hydrated and not skip meals.  Limit caffeine intake.  Follow-up with Cardiology as scheduled.

## 2014-11-17 ENCOUNTER — Other Ambulatory Visit: Payer: Self-pay | Admitting: Family Medicine

## 2014-11-17 ENCOUNTER — Telehealth: Payer: Self-pay | Admitting: Family Medicine

## 2014-11-17 DIAGNOSIS — F419 Anxiety disorder, unspecified: Principal | ICD-10-CM

## 2014-11-17 DIAGNOSIS — F329 Major depressive disorder, single episode, unspecified: Secondary | ICD-10-CM

## 2014-11-17 DIAGNOSIS — K219 Gastro-esophageal reflux disease without esophagitis: Secondary | ICD-10-CM

## 2014-11-17 DIAGNOSIS — IMO0001 Reserved for inherently not codable concepts without codable children: Secondary | ICD-10-CM

## 2014-11-17 DIAGNOSIS — F32A Depression, unspecified: Secondary | ICD-10-CM

## 2014-11-17 MED ORDER — PANTOPRAZOLE SODIUM 40 MG PO TBEC: 40.0000 mg | DELAYED_RELEASE_TABLET | Freq: Every day | ORAL | Status: DC | PRN

## 2014-11-17 MED ORDER — SERTRALINE HCL 100 MG PO TABS: 150.0000 mg | ORAL_TABLET | Freq: Every day | ORAL | Status: DC

## 2014-11-17 MED ORDER — ERGOCALCIFEROL 1.25 MG (50000 UT) PO CAPS: 50000.0000 [IU] | ORAL_CAPSULE | ORAL | Status: DC

## 2014-11-17 NOTE — Telephone Encounter (Signed)
Sent in Vitamin D.

## 2014-11-17 NOTE — Telephone Encounter (Signed)
Sent in Vitamin D 50,000 for 12 weeks per lab result instructions.

## 2014-11-17 NOTE — Telephone Encounter (Signed)
Pre Visit letter sent  °

## 2014-12-04 ENCOUNTER — Telehealth: Payer: Self-pay | Admitting: *Deleted

## 2014-12-04 ENCOUNTER — Encounter: Payer: Self-pay | Admitting: Internal Medicine

## 2014-12-04 ENCOUNTER — Encounter: Payer: Self-pay | Admitting: *Deleted

## 2014-12-04 NOTE — Telephone Encounter (Signed)
Pre-Visit Call completed with patient and chart updated.   Pre-Visit Info documented in Specialty Comments under [employer].    

## 2014-12-07 ENCOUNTER — Ambulatory Visit (INDEPENDENT_AMBULATORY_CARE_PROVIDER_SITE_OTHER): Payer: 59 | Admitting: Family Medicine

## 2014-12-07 ENCOUNTER — Encounter: Payer: Self-pay | Admitting: Family Medicine

## 2014-12-07 VITALS — BP 110/78 | HR 91 | Temp 97.8°F | Ht 62.0 in | Wt 157.5 lb

## 2014-12-07 DIAGNOSIS — R739 Hyperglycemia, unspecified: Secondary | ICD-10-CM | POA: Diagnosis not present

## 2014-12-07 DIAGNOSIS — K219 Gastro-esophageal reflux disease without esophagitis: Secondary | ICD-10-CM

## 2014-12-07 DIAGNOSIS — E663 Overweight: Secondary | ICD-10-CM

## 2014-12-07 DIAGNOSIS — R7989 Other specified abnormal findings of blood chemistry: Secondary | ICD-10-CM

## 2014-12-07 DIAGNOSIS — R945 Abnormal results of liver function studies: Secondary | ICD-10-CM

## 2014-12-07 DIAGNOSIS — R030 Elevated blood-pressure reading, without diagnosis of hypertension: Secondary | ICD-10-CM

## 2014-12-07 DIAGNOSIS — IMO0001 Reserved for inherently not codable concepts without codable children: Secondary | ICD-10-CM

## 2014-12-07 DIAGNOSIS — E559 Vitamin D deficiency, unspecified: Secondary | ICD-10-CM

## 2014-12-07 DIAGNOSIS — Z0000 Encounter for general adult medical examination without abnormal findings: Secondary | ICD-10-CM | POA: Diagnosis not present

## 2014-12-07 DIAGNOSIS — D649 Anemia, unspecified: Secondary | ICD-10-CM

## 2014-12-07 DIAGNOSIS — R000 Tachycardia, unspecified: Secondary | ICD-10-CM | POA: Diagnosis not present

## 2014-12-07 LAB — COMPREHENSIVE METABOLIC PANEL
ALBUMIN: 4 g/dL (ref 3.5–5.2)
ALT: 33 U/L (ref 0–35)
AST: 37 U/L (ref 0–37)
Alkaline Phosphatase: 111 U/L (ref 39–117)
BUN: 21 mg/dL (ref 6–23)
CO2: 30 mEq/L (ref 19–32)
Calcium: 10.9 mg/dL — ABNORMAL HIGH (ref 8.4–10.5)
Chloride: 102 mEq/L (ref 96–112)
Creatinine, Ser: 0.95 mg/dL (ref 0.40–1.20)
GFR: 65.75 mL/min (ref >60.00)
GLUCOSE: 96 mg/dL (ref 70–99)
POTASSIUM: 4.4 meq/L (ref 3.5–5.1)
SODIUM: 136 meq/L (ref 135–145)
TOTAL PROTEIN: 6.9 g/dL (ref 6.0–8.3)
Total Bilirubin: 0.6 mg/dL (ref 0.2–1.2)

## 2014-12-07 LAB — HEMOGLOBIN A1C: Hgb A1c MFr Bld: 5 % (ref 4.6–6.5)

## 2014-12-07 MED ORDER — ATORVASTATIN CALCIUM 10 MG PO TABS: 5.0000 mg | ORAL_TABLET | ORAL | Status: DC

## 2014-12-07 NOTE — Patient Instructions (Signed)
Preventive Care for Adults A healthy lifestyle and preventive care can promote health and wellness. Preventive health guidelines for women include the following key practices.  A routine yearly physical is a good way to check with your health care provider about your health and preventive screening. It is a chance to share any concerns and updates on your health and to receive a thorough exam.  Visit your dentist for a routine exam and preventive care every 6 months. Brush your teeth twice a day and floss once a day. Good oral hygiene prevents tooth decay and gum disease.  The frequency of eye exams is based on your age, health, family medical history, use of contact lenses, and other factors. Follow your health care provider's recommendations for frequency of eye exams.  Eat a healthy diet. Foods like vegetables, fruits, whole grains, low-fat dairy products, and lean protein foods contain the nutrients you need without too many calories. Decrease your intake of foods high in solid fats, added sugars, and salt. Eat the right amount of calories for you.Get information about a proper diet from your health care provider, if necessary.  Regular physical exercise is one of the most important things you can do for your health. Most adults should get at least 150 minutes of moderate-intensity exercise (any activity that increases your heart rate and causes you to sweat) each week. In addition, most adults need muscle-strengthening exercises on 2 or more days a week.  Maintain a healthy weight. The body mass index (BMI) is a screening tool to identify possible weight problems. It provides an estimate of body fat based on height and weight. Your health care provider can find your BMI and can help you achieve or maintain a healthy weight.For adults 20 years and older:  A BMI below 18.5 is considered underweight.  A BMI of 18.5 to 24.9 is normal.  A BMI of 25 to 29.9 is considered overweight.  A BMI of  30 and above is considered obese.  Maintain normal blood lipids and cholesterol levels by exercising and minimizing your intake of saturated fat. Eat a balanced diet with plenty of fruit and vegetables. Blood tests for lipids and cholesterol should begin at age 76 and be repeated every 5 years. If your lipid or cholesterol levels are high, you are over 50, or you are at high risk for heart disease, you may need your cholesterol levels checked more frequently.Ongoing high lipid and cholesterol levels should be treated with medicines if diet and exercise are not working.  If you smoke, find out from your health care provider how to quit. If you do not use tobacco, do not start.  Lung cancer screening is recommended for adults aged 22-80 years who are at high risk for developing lung cancer because of a history of smoking. A yearly low-dose CT scan of the lungs is recommended for people who have at least a 30-pack-year history of smoking and are a current smoker or have quit within the past 15 years. A pack year of smoking is smoking an average of 1 pack of cigarettes a day for 1 year (for example: 1 pack a day for 30 years or 2 packs a day for 15 years). Yearly screening should continue until the smoker has stopped smoking for at least 15 years. Yearly screening should be stopped for people who develop a health problem that would prevent them from having lung cancer treatment.  If you are pregnant, do not drink alcohol. If you are breastfeeding,  be very cautious about drinking alcohol. If you are not pregnant and choose to drink alcohol, do not have more than 1 drink per day. One drink is considered to be 12 ounces (355 mL) of beer, 5 ounces (148 mL) of wine, or 1.5 ounces (44 mL) of liquor.  Avoid use of street drugs. Do not share needles with anyone. Ask for help if you need support or instructions about stopping the use of drugs.  High blood pressure causes heart disease and increases the risk of  stroke. Your blood pressure should be checked at least every 1 to 2 years. Ongoing high blood pressure should be treated with medicines if weight loss and exercise do not work.  If you are 75-52 years old, ask your health care provider if you should take aspirin to prevent strokes.  Diabetes screening involves taking a blood sample to check your fasting blood sugar level. This should be done once every 3 years, after age 15, if you are within normal weight and without risk factors for diabetes. Testing should be considered at a younger age or be carried out more frequently if you are overweight and have at least 1 risk factor for diabetes.  Breast cancer screening is essential preventive care for women. You should practice "breast self-awareness." This means understanding the normal appearance and feel of your breasts and may include breast self-examination. Any changes detected, no matter how small, should be reported to a health care provider. Women in their 58s and 30s should have a clinical breast exam (CBE) by a health care provider as part of a regular health exam every 1 to 3 years. After age 16, women should have a CBE every year. Starting at age 53, women should consider having a mammogram (breast X-ray test) every year. Women who have a family history of breast cancer should talk to their health care provider about genetic screening. Women at a high risk of breast cancer should talk to their health care providers about having an MRI and a mammogram every year.  Breast cancer gene (BRCA)-related cancer risk assessment is recommended for women who have family members with BRCA-related cancers. BRCA-related cancers include breast, ovarian, tubal, and peritoneal cancers. Having family members with these cancers may be associated with an increased risk for harmful changes (mutations) in the breast cancer genes BRCA1 and BRCA2. Results of the assessment will determine the need for genetic counseling and  BRCA1 and BRCA2 testing.  Routine pelvic exams to screen for cancer are no longer recommended for nonpregnant women who are considered low risk for cancer of the pelvic organs (ovaries, uterus, and vagina) and who do not have symptoms. Ask your health care provider if a screening pelvic exam is right for you.  If you have had past treatment for cervical cancer or a condition that could lead to cancer, you need Pap tests and screening for cancer for at least 20 years after your treatment. If Pap tests have been discontinued, your risk factors (such as having a new sexual partner) need to be reassessed to determine if screening should be resumed. Some women have medical problems that increase the chance of getting cervical cancer. In these cases, your health care provider may recommend more frequent screening and Pap tests.  The HPV test is an additional test that may be used for cervical cancer screening. The HPV test looks for the virus that can cause the cell changes on the cervix. The cells collected during the Pap test can be  tested for HPV. The HPV test could be used to screen women aged 30 years and older, and should be used in women of any age who have unclear Pap test results. After the age of 30, women should have HPV testing at the same frequency as a Pap test.  Colorectal cancer can be detected and often prevented. Most routine colorectal cancer screening begins at the age of 50 years and continues through age 75 years. However, your health care provider may recommend screening at an earlier age if you have risk factors for colon cancer. On a yearly basis, your health care provider may provide home test kits to check for hidden blood in the stool. Use of a small camera at the end of a tube, to directly examine the colon (sigmoidoscopy or colonoscopy), can detect the earliest forms of colorectal cancer. Talk to your health care provider about this at age 50, when routine screening begins. Direct  exam of the colon should be repeated every 5-10 years through age 75 years, unless early forms of pre-cancerous polyps or small growths are found.  People who are at an increased risk for hepatitis B should be screened for this virus. You are considered at high risk for hepatitis B if:  You were born in a country where hepatitis B occurs often. Talk with your health care provider about which countries are considered high risk.  Your parents were born in a high-risk country and you have not received a shot to protect against hepatitis B (hepatitis B vaccine).  You have HIV or AIDS.  You use needles to inject street drugs.  You live with, or have sex with, someone who has hepatitis B.  You get hemodialysis treatment.  You take certain medicines for conditions like cancer, organ transplantation, and autoimmune conditions.  Hepatitis C blood testing is recommended for all people born from 1945 through 1965 and any individual with known risks for hepatitis C.  Practice safe sex. Use condoms and avoid high-risk sexual practices to reduce the spread of sexually transmitted infections (STIs). STIs include gonorrhea, chlamydia, syphilis, trichomonas, herpes, HPV, and human immunodeficiency virus (HIV). Herpes, HIV, and HPV are viral illnesses that have no cure. They can result in disability, cancer, and death.  You should be screened for sexually transmitted illnesses (STIs) including gonorrhea and chlamydia if:  You are sexually active and are younger than 24 years.  You are older than 24 years and your health care provider tells you that you are at risk for this type of infection.  Your sexual activity has changed since you were last screened and you are at an increased risk for chlamydia or gonorrhea. Ask your health care provider if you are at risk.  If you are at risk of being infected with HIV, it is recommended that you take a prescription medicine daily to prevent HIV infection. This is  called preexposure prophylaxis (PrEP). You are considered at risk if:  You are a heterosexual woman, are sexually active, and are at increased risk for HIV infection.  You take drugs by injection.  You are sexually active with a partner who has HIV.  Talk with your health care provider about whether you are at high risk of being infected with HIV. If you choose to begin PrEP, you should first be tested for HIV. You should then be tested every 3 months for as long as you are taking PrEP.  Osteoporosis is a disease in which the bones lose minerals and strength   with aging. This can result in serious bone fractures or breaks. The risk of osteoporosis can be identified using a bone density scan. Women ages 65 years and over and women at risk for fractures or osteoporosis should discuss screening with their health care providers. Ask your health care provider whether you should take a calcium supplement or vitamin D to reduce the rate of osteoporosis.  Menopause can be associated with physical symptoms and risks. Hormone replacement therapy is available to decrease symptoms and risks. You should talk to your health care provider about whether hormone replacement therapy is right for you.  Use sunscreen. Apply sunscreen liberally and repeatedly throughout the day. You should seek shade when your shadow is shorter than you. Protect yourself by wearing long sleeves, pants, a wide-brimmed hat, and sunglasses year round, whenever you are outdoors.  Once a month, do a whole body skin exam, using a mirror to look at the skin on your back. Tell your health care provider of new moles, moles that have irregular borders, moles that are larger than a pencil eraser, or moles that have changed in shape or color.  Stay current with required vaccines (immunizations).  Influenza vaccine. All adults should be immunized every year.  Tetanus, diphtheria, and acellular pertussis (Td, Tdap) vaccine. Pregnant women should  receive 1 dose of Tdap vaccine during each pregnancy. The dose should be obtained regardless of the length of time since the last dose. Immunization is preferred during the 27th-36th week of gestation. An adult who has not previously received Tdap or who does not know her vaccine status should receive 1 dose of Tdap. This initial dose should be followed by tetanus and diphtheria toxoids (Td) booster doses every 10 years. Adults with an unknown or incomplete history of completing a 3-dose immunization series with Td-containing vaccines should begin or complete a primary immunization series including a Tdap dose. Adults should receive a Td booster every 10 years.  Varicella vaccine. An adult without evidence of immunity to varicella should receive 2 doses or a second dose if she has previously received 1 dose. Pregnant females who do not have evidence of immunity should receive the first dose after pregnancy. This first dose should be obtained before leaving the health care facility. The second dose should be obtained 4-8 weeks after the first dose.  Human papillomavirus (HPV) vaccine. Females aged 13-26 years who have not received the vaccine previously should obtain the 3-dose series. The vaccine is not recommended for use in pregnant females. However, pregnancy testing is not needed before receiving a dose. If a female is found to be pregnant after receiving a dose, no treatment is needed. In that case, the remaining doses should be delayed until after the pregnancy. Immunization is recommended for any person with an immunocompromised condition through the age of 26 years if she did not get any or all doses earlier. During the 3-dose series, the second dose should be obtained 4-8 weeks after the first dose. The third dose should be obtained 24 weeks after the first dose and 16 weeks after the second dose.  Zoster vaccine. One dose is recommended for adults aged 60 years or older unless certain conditions are  present.  Measles, mumps, and rubella (MMR) vaccine. Adults born before 1957 generally are considered immune to measles and mumps. Adults born in 1957 or later should have 1 or more doses of MMR vaccine unless there is a contraindication to the vaccine or there is laboratory evidence of immunity to   each of the three diseases. A routine second dose of MMR vaccine should be obtained at least 28 days after the first dose for students attending postsecondary schools, health care workers, or international travelers. People who received inactivated measles vaccine or an unknown type of measles vaccine during 1963-1967 should receive 2 doses of MMR vaccine. People who received inactivated mumps vaccine or an unknown type of mumps vaccine before 1979 and are at high risk for mumps infection should consider immunization with 2 doses of MMR vaccine. For females of childbearing age, rubella immunity should be determined. If there is no evidence of immunity, females who are not pregnant should be vaccinated. If there is no evidence of immunity, females who are pregnant should delay immunization until after pregnancy. Unvaccinated health care workers born before 1957 who lack laboratory evidence of measles, mumps, or rubella immunity or laboratory confirmation of disease should consider measles and mumps immunization with 2 doses of MMR vaccine or rubella immunization with 1 dose of MMR vaccine.  Pneumococcal 13-valent conjugate (PCV13) vaccine. When indicated, a person who is uncertain of her immunization history and has no record of immunization should receive the PCV13 vaccine. An adult aged 19 years or older who has certain medical conditions and has not been previously immunized should receive 1 dose of PCV13 vaccine. This PCV13 should be followed with a dose of pneumococcal polysaccharide (PPSV23) vaccine. The PPSV23 vaccine dose should be obtained at least 8 weeks after the dose of PCV13 vaccine. An adult aged 19  years or older who has certain medical conditions and previously received 1 or more doses of PPSV23 vaccine should receive 1 dose of PCV13. The PCV13 vaccine dose should be obtained 1 or more years after the last PPSV23 vaccine dose.  Pneumococcal polysaccharide (PPSV23) vaccine. When PCV13 is also indicated, PCV13 should be obtained first. All adults aged 65 years and older should be immunized. An adult younger than age 65 years who has certain medical conditions should be immunized. Any person who resides in a nursing home or long-term care facility should be immunized. An adult smoker should be immunized. People with an immunocompromised condition and certain other conditions should receive both PCV13 and PPSV23 vaccines. People with human immunodeficiency virus (HIV) infection should be immunized as soon as possible after diagnosis. Immunization during chemotherapy or radiation therapy should be avoided. Routine use of PPSV23 vaccine is not recommended for American Indians, [place] Natives, or people younger than 65 years unless there are medical conditions that require PPSV23 vaccine. When indicated, people who have unknown immunization and have no record of immunization should receive PPSV23 vaccine. One-time revaccination 5 years after the first dose of PPSV23 is recommended for people aged 19-64 years who have chronic kidney failure, nephrotic syndrome, asplenia, or immunocompromised conditions. People who received 1-2 doses of PPSV23 before age 65 years should receive another dose of PPSV23 vaccine at age 65 years or later if at least 5 years have passed since the previous dose. Doses of PPSV23 are not needed for people immunized with PPSV23 at or after age 65 years.  Meningococcal vaccine. Adults with asplenia or persistent complement component deficiencies should receive 2 doses of quadrivalent meningococcal conjugate (MenACWY-D) vaccine. The doses should be obtained at least 2 months apart.  Microbiologists working with certain meningococcal bacteria, military recruits, people at risk during an outbreak, and people who travel to or live in countries with a high rate of meningitis should be immunized. A first-year college student up through age   21 years who is living in a residence hall should receive a dose if she did not receive a dose on or after her 16th birthday. Adults who have certain high-risk conditions should receive one or more doses of vaccine.  Hepatitis A vaccine. Adults who wish to be protected from this disease, have certain high-risk conditions, work with hepatitis A-infected animals, work in hepatitis A research labs, or travel to or work in countries with a high rate of hepatitis A should be immunized. Adults who were previously unvaccinated and who anticipate close contact with an international adoptee during the first 60 days after arrival in the [place] from a country with a high rate of hepatitis A should be immunized.  Hepatitis B vaccine. Adults who wish to be protected from this disease, have certain high-risk conditions, may be exposed to blood or other infectious body fluids, are household contacts or sex partners of hepatitis B positive people, are clients or workers in certain care facilities, or travel to or work in countries with a high rate of hepatitis B should be immunized.  Haemophilus influenzae type b (Hib) vaccine. A previously unvaccinated person with asplenia or sickle cell disease or having a scheduled splenectomy should receive 1 dose of Hib vaccine. Regardless of previous immunization, a recipient of a hematopoietic stem cell transplant should receive a 3-dose series 6-12 months after her successful transplant. Hib vaccine is not recommended for adults with HIV infection. Preventive Services / Frequency Ages 64 to 68 years  Blood pressure check.** / Every 1 to 2 years.  Lipid and cholesterol check.** / Every 5 years beginning at age  22.  Clinical breast exam.** / Every 3 years for women in their 88s and 53s.  BRCA-related cancer risk assessment.** / For women who have family members with a BRCA-related cancer (breast, ovarian, tubal, or peritoneal cancers).  Pap test.** / Every 2 years from ages 90 through 51. Every 3 years starting at age 21 through age 56 or 3 with a history of 3 consecutive normal Pap tests.  HPV screening.** / Every 3 years from ages 24 through ages 1 to 46 with a history of 3 consecutive normal Pap tests.  Hepatitis C blood test.** / For any individual with known risks for hepatitis C.  Skin self-exam. / Monthly.  Influenza vaccine. / Every year.  Tetanus, diphtheria, and acellular pertussis (Tdap, Td) vaccine.** / Consult your health care provider. Pregnant women should receive 1 dose of Tdap vaccine during each pregnancy. 1 dose of Td every 10 years.  Varicella vaccine.** / Consult your health care provider. Pregnant females who do not have evidence of immunity should receive the first dose after pregnancy.  HPV vaccine. / 3 doses over 6 months, if 72 and younger. The vaccine is not recommended for use in pregnant females. However, pregnancy testing is not needed before receiving a dose.  Measles, mumps, rubella (MMR) vaccine.** / You need at least 1 dose of MMR if you were born in 1957 or later. You may also need a 2nd dose. For females of childbearing age, rubella immunity should be determined. If there is no evidence of immunity, females who are not pregnant should be vaccinated. If there is no evidence of immunity, females who are pregnant should delay immunization until after pregnancy.  Pneumococcal 13-valent conjugate (PCV13) vaccine.** / Consult your health care provider.  Pneumococcal polysaccharide (PPSV23) vaccine.** / 1 to 2 doses if you smoke cigarettes or if you have certain conditions.  Meningococcal vaccine.** /  1 dose if you are age 19 to 21 years and a first-year college  student living in a residence hall, or have one of several medical conditions, you need to get vaccinated against meningococcal disease. You may also need additional booster doses.  Hepatitis A vaccine.** / Consult your health care provider.  Hepatitis B vaccine.** / Consult your health care provider.  Haemophilus influenzae type b (Hib) vaccine.** / Consult your health care provider. Ages 40 to 64 years  Blood pressure check.** / Every 1 to 2 years.  Lipid and cholesterol check.** / Every 5 years beginning at age 20 years.  Lung cancer screening. / Every year if you are aged 55-80 years and have a 30-pack-year history of smoking and currently smoke or have quit within the past 15 years. Yearly screening is stopped once you have quit smoking for at least 15 years or develop a health problem that would prevent you from having lung cancer treatment.  Clinical breast exam.** / Every year after age 40 years.  BRCA-related cancer risk assessment.** / For women who have family members with a BRCA-related cancer (breast, ovarian, tubal, or peritoneal cancers).  Mammogram.** / Every year beginning at age 40 years and continuing for as long as you are in good health. Consult with your health care provider.  Pap test.** / Every 3 years starting at age 30 years through age 65 or 70 years with a history of 3 consecutive normal Pap tests.  HPV screening.** / Every 3 years from ages 30 years through ages 65 to 70 years with a history of 3 consecutive normal Pap tests.  Fecal occult blood test (FOBT) of stool. / Every year beginning at age 50 years and continuing until age 75 years. You may not need to do this test if you get a colonoscopy every 10 years.  Flexible sigmoidoscopy or colonoscopy.** / Every 5 years for a flexible sigmoidoscopy or every 10 years for a colonoscopy beginning at age 50 years and continuing until age 75 years.  Hepatitis C blood test.** / For all people born from 1945 through  1965 and any individual with known risks for hepatitis C.  Skin self-exam. / Monthly.  Influenza vaccine. / Every year.  Tetanus, diphtheria, and acellular pertussis (Tdap/Td) vaccine.** / Consult your health care provider. Pregnant women should receive 1 dose of Tdap vaccine during each pregnancy. 1 dose of Td every 10 years.  Varicella vaccine.** / Consult your health care provider. Pregnant females who do not have evidence of immunity should receive the first dose after pregnancy.  Zoster vaccine.** / 1 dose for adults aged 60 years or older.  Measles, mumps, rubella (MMR) vaccine.** / You need at least 1 dose of MMR if you were born in 1957 or later. You may also need a 2nd dose. For females of childbearing age, rubella immunity should be determined. If there is no evidence of immunity, females who are not pregnant should be vaccinated. If there is no evidence of immunity, females who are pregnant should delay immunization until after pregnancy.  Pneumococcal 13-valent conjugate (PCV13) vaccine.** / Consult your health care provider.  Pneumococcal polysaccharide (PPSV23) vaccine.** / 1 to 2 doses if you smoke cigarettes or if you have certain conditions.  Meningococcal vaccine.** / Consult your health care provider.  Hepatitis A vaccine.** / Consult your health care provider.  Hepatitis B vaccine.** / Consult your health care provider.  Haemophilus influenzae type b (Hib) vaccine.** / Consult your health care provider. Ages 65   years and over  Blood pressure check.** / Every 1 to 2 years.  Lipid and cholesterol check.** / Every 5 years beginning at age 22 years.  Lung cancer screening. / Every year if you are aged 73-80 years and have a 30-pack-year history of smoking and currently smoke or have quit within the past 15 years. Yearly screening is stopped once you have quit smoking for at least 15 years or develop a health problem that would prevent you from having lung cancer  treatment.  Clinical breast exam.** / Every year after age 4 years.  BRCA-related cancer risk assessment.** / For women who have family members with a BRCA-related cancer (breast, ovarian, tubal, or peritoneal cancers).  Mammogram.** / Every year beginning at age 40 years and continuing for as long as you are in good health. Consult with your health care provider.  Pap test.** / Every 3 years starting at age 9 years through age 34 or 91 years with 3 consecutive normal Pap tests. Testing can be stopped between 65 and 70 years with 3 consecutive normal Pap tests and no abnormal Pap or HPV tests in the past 10 years.  HPV screening.** / Every 3 years from ages 57 years through ages 64 or 45 years with a history of 3 consecutive normal Pap tests. Testing can be stopped between 65 and 70 years with 3 consecutive normal Pap tests and no abnormal Pap or HPV tests in the past 10 years.  Fecal occult blood test (FOBT) of stool. / Every year beginning at age 15 years and continuing until age 17 years. You may not need to do this test if you get a colonoscopy every 10 years.  Flexible sigmoidoscopy or colonoscopy.** / Every 5 years for a flexible sigmoidoscopy or every 10 years for a colonoscopy beginning at age 86 years and continuing until age 71 years.  Hepatitis C blood test.** / For all people born from [birth year] through 1963 and any individual with known risks for hepatitis C.  Osteoporosis screening.** / A one-time screening for women ages 83 years and over and women at risk for fractures or osteoporosis.  Skin self-exam. / Monthly.  Influenza vaccine. / Every year.  Tetanus, diphtheria, and acellular pertussis (Tdap/Td) vaccine.** / 1 dose of Td every 10 years.  Varicella vaccine.** / Consult your health care provider.  Zoster vaccine.** / 1 dose for adults aged 61 years or older.  Pneumococcal 13-valent conjugate (PCV13) vaccine.** / Consult your health care provider.  Pneumococcal  polysaccharide (PPSV23) vaccine.** / 1 dose for all adults aged 28 years and older.  Meningococcal vaccine.** / Consult your health care provider.  Hepatitis A vaccine.** / Consult your health care provider.  Hepatitis B vaccine.** / Consult your health care provider.  Haemophilus influenzae type b (Hib) vaccine.** / Consult your health care provider. ** Family history and personal history of risk and conditions may change your health care provider's recommendations. Document Released: 09/12/2001 Document Revised: 12/01/2013 Document Reviewed: 12/12/2010 [facility] Patient Information 2015 [place], [place]. This information is not intended to replace advice given to you by your health care provider. Make sure you discuss any questions you have with your health care provider.

## 2014-12-07 NOTE — Assessment & Plan Note (Signed)
resolved 

## 2014-12-07 NOTE — Progress Notes (Signed)
Pre visit review using our clinic review tool, if applicable. No additional management support is needed unless otherwise documented below in the visit note. 

## 2014-12-07 NOTE — Assessment & Plan Note (Signed)
Well controlled. Encouraged heart healthy diet such as the DASH diet and exercise as tolerated.  

## 2014-12-07 NOTE — Assessment & Plan Note (Signed)
RRR no concerning symptoms, has appt with cardiology in June

## 2014-12-07 NOTE — Assessment & Plan Note (Signed)
Avoid offending foods, start probiotics. Do not eat large meals in late evening and consider raising head of bed.  

## 2014-12-07 NOTE — Assessment & Plan Note (Signed)
Encouraged DASH diet, decrease po intake and increase exercise as tolerated. Needs 7-8 hours of sleep nightly. Avoid trans fats, eat small, frequent meals every 4-5 hours with lean proteins, complex carbs and healthy fats. Minimize simple carbs, GMO foods. 

## 2014-12-07 NOTE — Assessment & Plan Note (Signed)
Tolerating 50000 Iu caps weekly add 1000 IU daily

## 2014-12-08 ENCOUNTER — Other Ambulatory Visit: Payer: Self-pay | Admitting: Family Medicine

## 2014-12-08 DIAGNOSIS — E342 Ectopic hormone secretion, not elsewhere classified: Secondary | ICD-10-CM

## 2014-12-08 LAB — PARATHYROID HORMONE, INTACT (NO CA): PTH: 178 pg/mL — ABNORMAL HIGH (ref 14–64)

## 2014-12-08 NOTE — Telephone Encounter (Signed)
CMP and ionized calcium labs entered for lab appt. Per PCP 12/21/14 to repeat.

## 2014-12-18 ENCOUNTER — Telehealth: Payer: Self-pay | Admitting: Family Medicine

## 2014-12-18 NOTE — Telephone Encounter (Signed)
Refill for Alprazolam was done on 11/10/14  #60 with 3 refills.  Have spoken to the patient and informed.

## 2014-12-18 NOTE — Telephone Encounter (Signed)
Patient husband calling in wanting to know if this can be done asap. They are leaving for vacation today.

## 2014-12-20 NOTE — Assessment & Plan Note (Signed)
resolved 

## 2014-12-20 NOTE — Assessment & Plan Note (Addendum)
Patient encouraged to maintain heart healthy diet, regular exercise, adequate sleep. Consider daily probiotics. Take medications as prescribed. Labs ordered and reviewed 

## 2014-12-20 NOTE — Assessment & Plan Note (Signed)
PTH elevated and vitamin D depressed, continue vitamin d supplements, minimize calcium intake. Referred to endocrinology

## 2014-12-20 NOTE — Progress Notes (Signed)
Sheryl [name] [id number] 03/30/1961 12/20/2014      Progress Note New Patient  Subjective  Chief Complaint  Chief Complaint  Patient presents with  . Annual Exam    HPI  Patient is a 54 year old female in today for routine medical care. She is in today for her annual exam. She had been in last month for her annual exam and was found to be tachycardic to over 140 bpm which did not resolve. She was asymptomatic but was sent to the ER due to the persistence of the tachycardia. While in the ER, the tachycardia stopped without treatment and she's had no symptoms that are concerning. Since but she does have a cardiology referral scheduled for next month to further evaluate. She has no active complaints today. She denies any recent illness or acute concerns. Denies CP/palp/SOB/HA/congestion/fevers/GI or GU c/o. Taking meds as prescribed  Past Medical History  Diagnosis Date  . Chicken pox as a child  . Anxiety   . GERD (gastroesophageal reflux disease)   . Depression   . Overweight(278.02)   . Anemia   . Preventative health care 06/07/2011  . FH: colon cancer 06/07/2011  . Elevated BP 02/06/2012  . Hyperlipidemia 05/08/2012  . Sacral pain 05/08/2012  . Vitamin D deficiency 05/09/2014  . Tachycardia 11/10/2014    Past Surgical History  Procedure Laterality Date  . Gum surgery  2007  . Right hand surgery  2009    infection debrided  . Cesarean section  1985 and 1989    X 2  . Colonoscopy      Family History  Problem Relation Age of Onset  . Heart attack Mother   . Diabetes Mother     type 2  . Hypertension Mother   . Hyperlipidemia Mother   . Heart disease Mother   . Cancer Father     colon  . Heart attack Father   . Colon cancer Father   . Hypertension Sister   . Diabetes Sister     type 2  . Hypertension Sister   . Diabetes Maternal Grandmother   . Hypertension Maternal Grandmother   . Hyperlipidemia Maternal Grandmother     History   Social History  . Marital  Status: Married    Spouse Name: N/A  . Number of Children: N/A  . Years of Education: N/A   Occupational History  . Not on file.   Social History Main Topics  . Smoking status: Never Smoker   . Smokeless tobacco: Never Used  . Alcohol Use: 7.0 oz/week    14 Standard drinks or equivalent per week     Comment: glass of wine nightly  . Drug Use: No  . Sexual Activity:    Partners: Male     Comment: no dietary restrictions, lives with husband   Other Topics Concern  . Not on file   Social History Narrative    Current Outpatient Prescriptions on File Prior to Visit  Medication Sig Dispense Refill  . ALPRAZolam (XANAX) 0.5 MG tablet Take 1/2-1 tab po bid prn for anxiety 60 tablet 3  . Coenzyme Q10 (CO Q 10 PO) Take 1 capsule by mouth daily.    . ergocalciferol (VITAMIN D2) 50000 UNITS capsule Take 1 capsule (50,000 Units total) by mouth once a week. 12 capsule 0  . Krill Oil CAPS 1 cap daily MegaRed by Schiff    . Multiple Vitamin (MULTIVITAMIN) capsule Take 1 capsule by mouth daily.      Marland Kitchen  pantoprazole (PROTONIX) 40 MG tablet Take 1 tablet (40 mg total) by mouth daily as needed. 90 tablet 1  . sertraline (ZOLOFT) 100 MG tablet Take 1.5 tablets (150 mg total) by mouth daily. 135 tablet 1  . VITAMIN D, ERGOCALCIFEROL, PO Take by mouth. Take 5000 IU daily    . [DISCONTINUED] FLUoxetine (PROZAC) 20 MG capsule Take 20 mg by mouth daily.       No current facility-administered medications on file prior to visit.    Allergies  Allergen Reactions  . Penicillins Rash    Review of Systems  Review of Systems  Constitutional: Negative for fever, chills and malaise/fatigue.  HENT: Negative for congestion, hearing loss and nosebleeds.   Eyes: Negative for discharge.  Respiratory: Negative for cough, sputum production, shortness of breath and wheezing.   Cardiovascular: Negative for chest pain, palpitations and leg swelling.  Gastrointestinal: Negative for heartburn, nausea,  vomiting, abdominal pain, diarrhea, constipation and blood in stool.  Genitourinary: Negative for dysuria, urgency, frequency and hematuria.  Musculoskeletal: Negative for myalgias, back pain and falls.  Skin: Negative for rash.  Neurological: Negative for dizziness, tremors, sensory change, focal weakness, loss of consciousness, weakness and headaches.  Endo/Heme/Allergies: Negative for polydipsia. Does not bruise/bleed easily.  Psychiatric/Behavioral: Negative for depression and suicidal ideas. The patient is not nervous/anxious and does not have insomnia.     Objective  BP 110/78 mmHg  Pulse 91  Temp(Src) 97.8 F (36.6 C) (Oral)  Ht 5\' 2"  (1.575 m)  Wt 157 lb 8 oz (71.442 kg)  BMI 28.80 kg/m2  SpO2 94%  LMP 09/08/2011  Physical Exam  Physical Exam  Constitutional: She is oriented to person, place, and time and well-developed, well-nourished, and in no distress. No distress.  HENT:  Head: Normocephalic and atraumatic.  Right Ear: External ear normal.  Left Ear: External ear normal.  Nose: Nose normal.  Mouth/Throat: Oropharynx is clear and moist. No oropharyngeal exudate.  Eyes: Conjunctivae are normal. Pupils are equal, round, and reactive to light. Right eye exhibits no discharge. Left eye exhibits no discharge. No scleral icterus.  Neck: Normal range of motion. Neck supple. No thyromegaly present.  Cardiovascular: Normal rate, regular rhythm, normal heart sounds and intact distal pulses.   No murmur heard. Pulmonary/Chest: Effort normal and breath sounds normal. No respiratory distress. She has no wheezes. She has no rales.  Abdominal: Soft. Bowel sounds are normal. She exhibits no distension and no mass. There is no tenderness.  Musculoskeletal: Normal range of motion. She exhibits no edema or tenderness.  Lymphadenopathy:    She has no cervical adenopathy.  Neurological: She is alert and oriented to person, place, and time. She has normal reflexes. No cranial nerve  deficit. Coordination normal.  Skin: Skin is warm and dry. No rash noted. She is not diaphoretic.  Psychiatric: Mood, memory and affect normal.       Assessment & Plan  GERD (gastroesophageal reflux disease) Avoid offending foods, start probiotics. Do not eat large meals in late evening and consider raising head of bed.    Overweight Encouraged DASH diet, decrease po intake and increase exercise as tolerated. Needs 7-8 hours of sleep nightly. Avoid trans fats, eat small, frequent meals every 4-5 hours with lean proteins, complex carbs and healthy fats. Minimize simple carbs, GMO foods.   Elevated BP Well controlled. Encouraged heart healthy diet such as the DASH diet and exercise as tolerated.    Tachycardia RRR no concerning symptoms, has appt with cardiology in June  Vitamin D deficiency Tolerating 50000 Iu caps weekly add 1000 IU daily   Anemia resolved   Preventative health care Patient encouraged to maintain heart healthy diet, regular exercise, adequate sleep. Consider daily probiotics. Take medications as prescribed. Labs ordered and reviewed   Abnormal liver function test resolved   Hypercalcemia PTH elevated and vitamin D depressed, continue vitamin d supplements, minimize calcium intake. Referred to endocrinology

## 2014-12-21 ENCOUNTER — Other Ambulatory Visit: Payer: 59

## 2015-01-08 ENCOUNTER — Ambulatory Visit: Payer: 59 | Admitting: Cardiology

## 2015-01-15 ENCOUNTER — Ambulatory Visit: Payer: 59 | Admitting: Endocrinology

## 2015-01-18 ENCOUNTER — Encounter: Payer: Self-pay | Admitting: Family Medicine

## 2015-01-18 ENCOUNTER — Encounter: Payer: Self-pay | Admitting: Endocrinology

## 2015-01-18 ENCOUNTER — Other Ambulatory Visit: Payer: Self-pay | Admitting: Family Medicine

## 2015-01-18 ENCOUNTER — Ambulatory Visit (INDEPENDENT_AMBULATORY_CARE_PROVIDER_SITE_OTHER): Payer: 59 | Admitting: Endocrinology

## 2015-01-18 VITALS — BP 122/84 | HR 93 | Temp 97.7°F | Resp 16 | Ht 62.0 in | Wt 161.0 lb

## 2015-01-18 DIAGNOSIS — E210 Primary hyperparathyroidism: Secondary | ICD-10-CM | POA: Diagnosis not present

## 2015-01-18 DIAGNOSIS — E559 Vitamin D deficiency, unspecified: Secondary | ICD-10-CM | POA: Diagnosis not present

## 2015-01-18 NOTE — Telephone Encounter (Signed)
Called [facility] [address].  The last refill was on 11/16/14  #60 with 0 refills and that prescription had no refills on it. I also called the patient and she was given by PCP a hardcopy at her OV on 11/10/14 #60 with 3 refills, the patient states she must have lost prescription.

## 2015-01-18 NOTE — Progress Notes (Signed)
Patient ID: Sheryl [name], female   DOB: 1961-02-16, 54 y.o.   MRN: [id number]    Chief complaint: High calcium , follow-up  History of Present Illness:   She has been diagnosed to have primary hyperparathyroidism.   Review of records show that she has had a high calcium since at least 2013 as follows:  Lab Results  Component Value Date   CALCIUM 10.9* 12/07/2014   CALCIUM 10.8* 11/16/2014   CALCIUM 10.5 11/10/2014   CALCIUM 10.6* 05/08/2014   CALCIUM 10.9* 11/12/2013   CALCIUM 11.0* 11/12/2013   CALCIUM 10.9* 09/12/2013   CALCIUM 10.5 01/10/2013   CALCIUM 10.5 05/01/2012   Hypercalcemia appears to be more consistent in 2015  She does not have any symptoms of bone pain, decreased appetite, nausea  No history of kidney stones   Recent serologic and radiologic studies have included:  Lab Results  Component Value Date   PTH 178* 12/07/2014   CALCIUM 10.9* 12/07/2014   PHOS 2.6 05/08/2014       She was recently seen by her PCP and because of the concern of persistent hypercalcemia she is here for follow-up     She is again asymptomatic as above   Bone density in 2015 did not show any significant osteopenia, lowest T score was -1.1 at the left femoral neck   VITAMIN D deficiency:  She was told last year to take 1000 units of vitamin D 3 but not a higher dose because of upper normal level of 1, 25-hydroxy vitamin D levels.  However she did this for a month or 2 and did not continue Her recent vitamin D level is about 21 again and she is taking 50,000 units weekly     Medication List       This list is accurate as of: 01/18/15 11:01 AM.  Always use your most recent med list.               ALPRAZolam 0.5 MG tablet  Commonly known as:  XANAX  Take 1/2-1 tab po bid prn for anxiety     atorvastatin 10 MG tablet  Commonly known as:  LIPITOR  Take 0.5 tablets (5 mg total) by mouth every other day.     CO Q 10 PO  Take 1 capsule by mouth daily.     ergocalciferol  50000 UNITS capsule  Commonly known as:  VITAMIN D2  Take 1 capsule (50,000 Units total) by mouth once a week.     Krill Oil Caps  1 cap daily MegaRed by Schiff     multivitamin capsule  Take 1 capsule by mouth daily.     pantoprazole 40 MG tablet  Commonly known as:  PROTONIX  Take 1 tablet (40 mg total) by mouth daily as needed.     sertraline 100 MG tablet  Commonly known as:  ZOLOFT  Take 1.5 tablets (150 mg total) by mouth daily.        Allergies:  Allergies  Allergen Reactions  . Penicillins Rash    Past Medical History  Diagnosis Date  . Chicken pox as a child  . Anxiety   . GERD (gastroesophageal reflux disease)   . Depression   . Overweight(278.02)   . Anemia   . Preventative health care 06/07/2011  . FH: colon cancer 06/07/2011  . Elevated BP 02/06/2012  . Hyperlipidemia 05/08/2012  . Sacral pain 05/08/2012  . Vitamin D deficiency 05/09/2014  . Tachycardia 11/10/2014    Past Surgical History  Procedure Laterality Date  . Gum surgery  2007  . Right hand surgery  2009    infection debrided  . Cesarean section  1985 and 1989    X 2  . Colonoscopy      Family History  Problem Relation Age of Onset  . Heart attack Mother   . Diabetes Mother     type 2  . Hypertension Mother   . Hyperlipidemia Mother   . Heart disease Mother   . Cancer Father     colon  . Heart attack Father   . Colon cancer Father   . Hypertension Sister   . Diabetes Sister     type 2  . Hypertension Sister   . Diabetes Maternal Grandmother   . Hypertension Maternal Grandmother   . Hyperlipidemia Maternal Grandmother     Social History:  reports that she has never smoked. She has never used smokeless tobacco. She reports that she drinks about 7.0 oz of alcohol per week. She reports that she does not use illicit drugs.  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   Vitamin D deficiency: because of her tendency to high calcium levels she should not be taking high-dose supplements and will have go back to the 1000 units of vitamin D 3 daily and she can continue to take this long-term   Will check 1, 25-hydroxyvitamin D also on the next visit.   Denvil Canning 01/18/2015, 11:01 AM

## 2015-01-18 NOTE — Telephone Encounter (Signed)
So I am willing to refill this once but warn her she has to be a bit more careful because this is a controlled substance so I can refill this again if it gets loss. Can refill Alprazolam disp #60 same sig. No refills.

## 2015-01-18 NOTE — Patient Instructions (Addendum)
Vitamin D3, 1000 units daily long term, stop the Rx for weekly dose

## 2015-01-18 NOTE — Telephone Encounter (Signed)
She should not be due till august, I gave her 10 with 3 rf in April, if she is struggling she may need to come in to discuss. The 60 should be considered a 30 day supply. If she is so stressed she is taking more we need a visit to come up with a better plan

## 2015-01-18 NOTE — Telephone Encounter (Signed)
Requesting:  ALPRAZOLAM Contract   SIGNED ON 12/03/14 UDS  NO UDS ON FILE Last OV   12/07/14 Last Refill   #60 WITH 3 REFILLS ON 11/10/14  Please Advise

## 2015-01-18 NOTE — Telephone Encounter (Signed)
Called left message to call back 

## 2015-01-19 NOTE — Telephone Encounter (Signed)
Printed and on counter for signature. 

## 2015-01-19 NOTE — Telephone Encounter (Signed)
Faxed hardcopy to [facility] patient informed of PCP instructions.

## 2015-02-12 ENCOUNTER — Encounter: Payer: Self-pay | Admitting: Cardiology

## 2015-02-12 ENCOUNTER — Ambulatory Visit (INDEPENDENT_AMBULATORY_CARE_PROVIDER_SITE_OTHER): Payer: Commercial Managed Care - HMO | Admitting: Cardiology

## 2015-02-12 VITALS — BP 126/82 | HR 102 | Ht 62.0 in | Wt 161.0 lb

## 2015-02-12 DIAGNOSIS — R000 Tachycardia, unspecified: Secondary | ICD-10-CM | POA: Diagnosis not present

## 2015-02-12 DIAGNOSIS — I471 Supraventricular tachycardia: Secondary | ICD-10-CM | POA: Diagnosis not present

## 2015-02-12 DIAGNOSIS — R002 Palpitations: Secondary | ICD-10-CM

## 2015-02-12 MED ORDER — METOPROLOL SUCCINATE ER 25 MG PO TB24: 25.0000 mg | ORAL_TABLET | Freq: Every day | ORAL | Status: DC

## 2015-02-12 NOTE — Patient Instructions (Addendum)
Medication Instructions:  Your physician has recommended you make the following change in your medication:  1) START TOPROL XL 25 mg daily  Labwork: None  Testing/Procedures: Your physician has recommended that you wear an event monitor. Event monitors are medical devices that record the heart's electrical activity. Doctors most often [place] these monitors to diagnose arrhythmias. Arrhythmias are problems with the speed or rhythm of the heartbeat. The monitor is a small, portable device. You can wear one while you do your normal daily activities. This is usually used to diagnose what is causing palpitations/syncope (passing out).  Follow-Up: Your physician wants you to follow-up in: 1 MONTH with Dr. Radford Pax. This has been scheduled for 03/24/15 at 8:15 AM.  Any Other Special Instructions Will Be Listed Below (If Applicable).

## 2015-02-12 NOTE — Progress Notes (Signed)
Cardiology Office Note   Date:  02/13/2015   ID:  Sheryl [name], DOB 07/04/1961, MRN [id number]  PCP:  Penni Homans, MD    Chief Complaint  Patient presents with  . Tachycardia      History of Present Illness: Sheryl [name] is a 54 y.o. female who presents for tachycardia.  She was  seen for a Pap smear in April of this year and her PCP noted that her heart rate was rapid.  An EKG was done which showed narrow complex Tachycardia at 140 bpm.  She was sent to the ER.  She denied any CP at that time but had had some left arm pain off an on for a week.  She denied any palpitations, dizziness or SOB.  While being examined in the ER she spontaneously converted to NSR.  She now presents for further evaluation.  She denies feeling any palpitations, chest pain, SOB, dizziness or syncope.  She says that once in a while she will have a vague discomfort in her right chest and when she takes her pulse it feels like it is racing.  CMET, CBC and TSH were normal in the ER.      Past Medical History  Diagnosis Date  . Chicken pox as a child  . Anxiety   . GERD (gastroesophageal reflux disease)   . Depression   . Overweight(278.02)   . Anemia   . Preventative health care 06/07/2011  . FH: colon cancer 06/07/2011  . Elevated BP 02/06/2012  . Hyperlipidemia 05/08/2012  . Sacral pain 05/08/2012  . Vitamin D deficiency 05/09/2014  . Tachycardia 11/10/2014    Past Surgical History  Procedure Laterality Date  . Gum surgery  2007  . Right hand surgery  2009    infection debrided  . Cesarean section  1985 and 1989    X 2  . Colonoscopy       Current Outpatient Prescriptions  Medication Sig Dispense Refill  . ALPRAZolam (XANAX) 0.5 MG tablet TAKE 1/2 TO 1 TABLET BY MOUTH TWICE A DAY AS NEEDED FOR ANXIETY 60 tablet 0  . atorvastatin (LIPITOR) 10 MG tablet Take 0.5 tablets (5 mg total) by mouth every other day. 90 tablet 3  . Coenzyme Q10 (CO Q 10 PO) Take 1 capsule by  mouth daily.    . ergocalciferol (VITAMIN D2) 50000 UNITS capsule Take 1 capsule (50,000 Units total) by mouth once a week. 12 capsule 0  . Krill Oil CAPS 1 cap daily MegaRed by Schiff    . Multiple Vitamin (MULTIVITAMIN) capsule Take 1 capsule by mouth daily.      . pantoprazole (PROTONIX) 40 MG tablet Take 1 tablet (40 mg total) by mouth daily as needed. 90 tablet 1  . sertraline (ZOLOFT) 100 MG tablet Take 1.5 tablets (150 mg total) by mouth daily. 135 tablet 1  . metoprolol succinate (TOPROL XL) 25 MG 24 hr tablet Take 1 tablet (25 mg total) by mouth daily. 30 tablet 11  . [DISCONTINUED] FLUoxetine (PROZAC) 20 MG capsule Take 20 mg by mouth daily.       No current facility-administered medications for this visit.    Allergies:   Penicillins    Social History:  The patient  reports that she has never smoked. She has never used smokeless tobacco. She reports that she drinks about 21.0 oz of alcohol per week. She reports that she  does not use illicit drugs.   Family History:  The patient's family history includes Cancer in her father; Colon cancer in her father; Diabetes in her maternal grandmother, mother, and sister; Heart attack in her father and mother; Heart disease in her mother; Hyperlipidemia in her maternal grandmother and mother; Hypertension in her maternal grandmother, mother, sister, and sister.    ROS:  Please see the history of present illness.   Otherwise, review of systems are positive for none.   All other systems are reviewed and negative.    PHYSICAL EXAM: VS:  BP 126/82 mmHg  Pulse 102  Ht 5\' 2"  (1.575 m)  Wt 161 lb (73.029 kg)  BMI 29.44 kg/m2  LMP 09/08/2011 , BMI Body mass index is 29.44 kg/(m^2). GEN: Well nourished, well developed, in no acute distress HEENT: normal Neck: no JVD, carotid bruits, or masses Cardiac: RRR; no murmurs, rubs, or gallops,no edema  Respiratory:  clear to auscultation bilaterally, normal work of breathing GI: soft, nontender,  nondistended, + BS MS: no deformity or atrophy Skin: warm and dry, no rash Neuro:  Strength and sensation are intact Psych: euthymic mood, full affect   EKG:  EKG was ordered today and showed sinus tachycardia at 103 bpm with no ST changes    Recent Labs: 11/10/2014: Hemoglobin 15.3*; Platelets 140* 11/16/2014: Magnesium 2.3; TSH 2.68 12/07/2014: ALT 33; BUN 21; Creatinine, Ser 0.95; Potassium 4.4; Sodium 136    Lipid Panel    Component Value Date/Time   CHOL 265* 11/16/2014 1207   TRIG 65.0 11/16/2014 1207   HDL 133.70 11/16/2014 1207   CHOLHDL 2 11/16/2014 1207   VLDL 13.0 11/16/2014 1207   LDLCALC 118* 11/16/2014 1207   LDLDIRECT 103.8 01/10/2013 0814      Wt Readings from Last 3 Encounters:  02/12/15 161 lb (73.029 kg)  01/18/15 161 lb (73.029 kg)  12/07/14 157 lb 8 oz (71.442 kg)        ASSESSMENT AND PLAN:  1.  Palpitations with SVT noted on EKG at 146bpm in PCP office.  This is most c/w atrial tachycardia.  She is in NSR today. She is mildly symptomatic and gets a mild sensation in her right chest and then will notice her heart racing when she takes her pulse.  I have recommended starting Toprol XL 25mg  daily and get a 30day heart monitor to assess how much atrial tach she is having.  I have encouraged her to cut back on her caffeine and ETOH intake.  Her TSH was normal in ER.  I will get a 2D echo to assess LVF and rule out valvular heart disease.     Current medicines are reviewed at length with the patient today.  The patient does not have concerns regarding medicines.  The following changes have been made:  no change  Labs/ tests ordered today: See above Assessment and Plan  Orders Placed This Encounter  Procedures  . Cardiac event monitor  . EKG 12-Lead     Disposition:   FU with me in 1 month  Signed, Sueanne Margarita, MD  02/13/2015 5:04 PM    [facility] [address], [place], [place]  [address] Phone: [phone]; Fax:  [phone]

## 2015-02-13 ENCOUNTER — Telehealth: Payer: Self-pay | Admitting: Cardiology

## 2015-02-13 DIAGNOSIS — I471 Supraventricular tachycardia: Secondary | ICD-10-CM

## 2015-02-13 NOTE — Telephone Encounter (Signed)
Please order a 2D echo to assess LVF

## 2015-02-15 NOTE — Telephone Encounter (Signed)
Left message to call back  

## 2015-02-17 NOTE — Telephone Encounter (Signed)
Left message to call back  

## 2015-02-22 ENCOUNTER — Ambulatory Visit (INDEPENDENT_AMBULATORY_CARE_PROVIDER_SITE_OTHER): Payer: Commercial Managed Care - HMO

## 2015-02-22 DIAGNOSIS — R002 Palpitations: Secondary | ICD-10-CM

## 2015-02-22 DIAGNOSIS — R000 Tachycardia, unspecified: Secondary | ICD-10-CM | POA: Diagnosis not present

## 2015-02-22 DIAGNOSIS — I471 Supraventricular tachycardia: Secondary | ICD-10-CM

## 2015-02-22 NOTE — Telephone Encounter (Signed)
Left message to call back  

## 2015-02-22 NOTE — Telephone Encounter (Signed)
Patient agrees to Dr. Theodosia Blender recommendation for ECHO. Test ordered for scheduling.

## 2015-02-22 NOTE — Addendum Note (Signed)
Addended by: Harland German A on: 02/22/2015 01:42 PM   Modules accepted: Orders

## 2015-03-16 ENCOUNTER — Ambulatory Visit (HOSPITAL_COMMUNITY): Payer: Commercial Managed Care - HMO | Attending: Cardiology

## 2015-03-16 DIAGNOSIS — I471 Supraventricular tachycardia: Secondary | ICD-10-CM

## 2015-03-16 DIAGNOSIS — I071 Rheumatic tricuspid insufficiency: Secondary | ICD-10-CM | POA: Insufficient documentation

## 2015-03-16 DIAGNOSIS — E669 Obesity, unspecified: Secondary | ICD-10-CM | POA: Diagnosis not present

## 2015-03-16 DIAGNOSIS — E785 Hyperlipidemia, unspecified: Secondary | ICD-10-CM | POA: Diagnosis not present

## 2015-03-16 DIAGNOSIS — I5189 Other ill-defined heart diseases: Secondary | ICD-10-CM | POA: Insufficient documentation

## 2015-03-16 DIAGNOSIS — Z6829 Body mass index (BMI) 29.0-29.9, adult: Secondary | ICD-10-CM | POA: Insufficient documentation

## 2015-03-16 DIAGNOSIS — I4719 Other supraventricular tachycardia: Secondary | ICD-10-CM

## 2015-03-17 ENCOUNTER — Telehealth: Payer: Self-pay | Admitting: Cardiology

## 2015-03-17 NOTE — Telephone Encounter (Signed)
New message ° ° ° ° ° °Want echo results °

## 2015-03-17 NOTE — Telephone Encounter (Signed)
I spoke with pt and reviewed echo results with her.  

## 2015-03-24 ENCOUNTER — Ambulatory Visit: Payer: Commercial Managed Care - HMO | Admitting: Cardiology

## 2015-03-29 ENCOUNTER — Ambulatory Visit: Payer: Commercial Managed Care - HMO | Admitting: Cardiology

## 2015-04-04 ENCOUNTER — Other Ambulatory Visit: Payer: Self-pay | Admitting: Family Medicine

## 2015-04-05 ENCOUNTER — Other Ambulatory Visit: Payer: Self-pay | Admitting: Family Medicine

## 2015-04-05 NOTE — Telephone Encounter (Signed)
OK to refill Alprazolam with same strength, same sig, #60 with 1 rf

## 2015-04-06 NOTE — Telephone Encounter (Signed)
Faxed hardcopy to [facility] [address] [place] 

## 2015-04-06 NOTE — Telephone Encounter (Signed)
Rx printed by Shirlean Mylar.     KP

## 2015-04-12 ENCOUNTER — Ambulatory Visit (INDEPENDENT_AMBULATORY_CARE_PROVIDER_SITE_OTHER): Payer: Commercial Managed Care - HMO | Admitting: Family Medicine

## 2015-04-12 ENCOUNTER — Encounter: Payer: Self-pay | Admitting: Family Medicine

## 2015-04-12 ENCOUNTER — Other Ambulatory Visit (HOSPITAL_COMMUNITY)
Admission: RE | Admit: 2015-04-12 | Discharge: 2015-04-12 | Disposition: A | Discharge status: Home / Self Care | Payer: Commercial Managed Care - HMO | Source: Ambulatory Visit | Attending: Family Medicine | Admitting: Family Medicine

## 2015-04-12 VITALS — BP 118/64 | HR 72 | Temp 98.4°F | Resp 16 | Ht 62.0 in | Wt 162.0 lb

## 2015-04-12 DIAGNOSIS — R002 Palpitations: Secondary | ICD-10-CM

## 2015-04-12 DIAGNOSIS — E559 Vitamin D deficiency, unspecified: Secondary | ICD-10-CM

## 2015-04-12 DIAGNOSIS — Z23 Encounter for immunization: Secondary | ICD-10-CM

## 2015-04-12 DIAGNOSIS — Z01419 Encounter for gynecological examination (general) (routine) without abnormal findings: Secondary | ICD-10-CM | POA: Insufficient documentation

## 2015-04-12 DIAGNOSIS — R030 Elevated blood-pressure reading, without diagnosis of hypertension: Secondary | ICD-10-CM

## 2015-04-12 DIAGNOSIS — Z124 Encounter for screening for malignant neoplasm of cervix: Secondary | ICD-10-CM | POA: Diagnosis not present

## 2015-04-12 DIAGNOSIS — K219 Gastro-esophageal reflux disease without esophagitis: Secondary | ICD-10-CM

## 2015-04-12 DIAGNOSIS — IMO0001 Reserved for inherently not codable concepts without codable children: Secondary | ICD-10-CM

## 2015-04-12 DIAGNOSIS — E782 Mixed hyperlipidemia: Secondary | ICD-10-CM

## 2015-04-12 DIAGNOSIS — F411 Generalized anxiety disorder: Secondary | ICD-10-CM

## 2015-04-12 DIAGNOSIS — R000 Tachycardia, unspecified: Secondary | ICD-10-CM

## 2015-04-12 LAB — COMPREHENSIVE METABOLIC PANEL
ALT: 41 U/L — ABNORMAL HIGH (ref 0–35)
AST: 46 U/L — ABNORMAL HIGH (ref 0–37)
Albumin: 4 g/dL (ref 3.5–5.2)
Alkaline Phosphatase: 99 U/L (ref 39–117)
BUN: 18 mg/dL (ref 6–23)
CALCIUM: 10.4 mg/dL (ref 8.4–10.5)
CHLORIDE: 106 meq/L (ref 96–112)
CO2: 26 meq/L (ref 19–32)
Creatinine, Ser: 0.84 mg/dL (ref 0.40–1.20)
GFR: 75.68 mL/min (ref >60.00)
Glucose, Bld: 89 mg/dL (ref 70–99)
Potassium: 4.1 mEq/L (ref 3.5–5.1)
Sodium: 140 mEq/L (ref 135–145)
Total Bilirubin: 0.8 mg/dL (ref 0.2–1.2)
Total Protein: 6.8 g/dL (ref 6.0–8.3)

## 2015-04-12 LAB — LIPID PANEL
CHOL/HDL RATIO: 2
Cholesterol: 260 mg/dL — ABNORMAL HIGH (ref 0–200)
HDL: 131.9 mg/dL (ref >39.00)
LDL CALC: 109 mg/dL — AB (ref 0–99)
NonHDL: 127.69
TRIGLYCERIDES: 93 mg/dL (ref 0.0–149.0)
VLDL: 18.6 mg/dL (ref 0.0–40.0)

## 2015-04-12 MED ORDER — ALPRAZOLAM 0.5 MG PO TABS: ORAL_TABLET | ORAL | Status: DC

## 2015-04-12 MED ORDER — PANTOPRAZOLE SODIUM 40 MG PO TBEC: 40.0000 mg | DELAYED_RELEASE_TABLET | Freq: Every day | ORAL | Status: DC | PRN

## 2015-04-12 NOTE — Progress Notes (Signed)
Pre visit review using our clinic review tool, if applicable. No additional management support is needed unless otherwise documented below in the visit note. 

## 2015-04-12 NOTE — Assessment & Plan Note (Signed)
Following with endocrinology now. asymptomatic

## 2015-04-12 NOTE — Assessment & Plan Note (Signed)
taing the 50000 dose weekly encouraged 2000 IU daily as well

## 2015-04-12 NOTE — Patient Instructions (Signed)
Preventive Care for Adults A healthy lifestyle and preventive care can promote health and wellness. Preventive health guidelines for women include the following key practices.  A routine yearly physical is a good way to check with your health care provider about your health and preventive screening. It is a chance to share any concerns and updates on your health and to receive a thorough exam.  Visit your dentist for a routine exam and preventive care every 6 months. Brush your teeth twice a day and floss once a day. Good oral hygiene prevents tooth decay and gum disease.  The frequency of eye exams is based on your age, health, family medical history, use of contact lenses, and other factors. Follow your health care provider's recommendations for frequency of eye exams.  Eat a healthy diet. Foods like vegetables, fruits, whole grains, low-fat dairy products, and lean protein foods contain the nutrients you need without too many calories. Decrease your intake of foods high in solid fats, added sugars, and salt. Eat the right amount of calories for you.Get information about a proper diet from your health care provider, if necessary.  Regular physical exercise is one of the most important things you can do for your health. Most adults should get at least 150 minutes of moderate-intensity exercise (any activity that increases your heart rate and causes you to sweat) each week. In addition, most adults need muscle-strengthening exercises on 2 or more days a week.  Maintain a healthy weight. The body mass index (BMI) is a screening tool to identify possible weight problems. It provides an estimate of body fat based on height and weight. Your health care provider can find your BMI and can help you achieve or maintain a healthy weight.For adults 20 years and older:  A BMI below 18.5 is considered underweight.  A BMI of 18.5 to 24.9 is normal.  A BMI of 25 to 29.9 is considered overweight.  A BMI of  30 and above is considered obese.  Maintain normal blood lipids and cholesterol levels by exercising and minimizing your intake of saturated fat. Eat a balanced diet with plenty of fruit and vegetables. Blood tests for lipids and cholesterol should begin at age 76 and be repeated every 5 years. If your lipid or cholesterol levels are high, you are over 50, or you are at high risk for heart disease, you may need your cholesterol levels checked more frequently.Ongoing high lipid and cholesterol levels should be treated with medicines if diet and exercise are not working.  If you smoke, find out from your health care provider how to quit. If you do not use tobacco, do not start.  Lung cancer screening is recommended for adults aged 22-80 years who are at high risk for developing lung cancer because of a history of smoking. A yearly low-dose CT scan of the lungs is recommended for people who have at least a 30-pack-year history of smoking and are a current smoker or have quit within the past 15 years. A pack year of smoking is smoking an average of 1 pack of cigarettes a day for 1 year (for example: 1 pack a day for 30 years or 2 packs a day for 15 years). Yearly screening should continue until the smoker has stopped smoking for at least 15 years. Yearly screening should be stopped for people who develop a health problem that would prevent them from having lung cancer treatment.  If you are pregnant, do not drink alcohol. If you are breastfeeding,  be very cautious about drinking alcohol. If you are not pregnant and choose to drink alcohol, do not have more than 1 drink per day. One drink is considered to be 12 ounces (355 mL) of beer, 5 ounces (148 mL) of wine, or 1.5 ounces (44 mL) of liquor.  Avoid use of street drugs. Do not share needles with anyone. Ask for help if you need support or instructions about stopping the use of drugs.  High blood pressure causes heart disease and increases the risk of  stroke. Your blood pressure should be checked at least every 1 to 2 years. Ongoing high blood pressure should be treated with medicines if weight loss and exercise do not work.  If you are 75-52 years old, ask your health care provider if you should take aspirin to prevent strokes.  Diabetes screening involves taking a blood sample to check your fasting blood sugar level. This should be done once every 3 years, after age 15, if you are within normal weight and without risk factors for diabetes. Testing should be considered at a younger age or be carried out more frequently if you are overweight and have at least 1 risk factor for diabetes.  Breast cancer screening is essential preventive care for women. You should practice "breast self-awareness." This means understanding the normal appearance and feel of your breasts and may include breast self-examination. Any changes detected, no matter how small, should be reported to a health care provider. Women in their 58s and 30s should have a clinical breast exam (CBE) by a health care provider as part of a regular health exam every 1 to 3 years. After age 16, women should have a CBE every year. Starting at age 53, women should consider having a mammogram (breast X-ray test) every year. Women who have a family history of breast cancer should talk to their health care provider about genetic screening. Women at a high risk of breast cancer should talk to their health care providers about having an MRI and a mammogram every year.  Breast cancer gene (BRCA)-related cancer risk assessment is recommended for women who have family members with BRCA-related cancers. BRCA-related cancers include breast, ovarian, tubal, and peritoneal cancers. Having family members with these cancers may be associated with an increased risk for harmful changes (mutations) in the breast cancer genes BRCA1 and BRCA2. Results of the assessment will determine the need for genetic counseling and  BRCA1 and BRCA2 testing.  Routine pelvic exams to screen for cancer are no longer recommended for nonpregnant women who are considered low risk for cancer of the pelvic organs (ovaries, uterus, and vagina) and who do not have symptoms. Ask your health care provider if a screening pelvic exam is right for you.  If you have had past treatment for cervical cancer or a condition that could lead to cancer, you need Pap tests and screening for cancer for at least 20 years after your treatment. If Pap tests have been discontinued, your risk factors (such as having a new sexual partner) need to be reassessed to determine if screening should be resumed. Some women have medical problems that increase the chance of getting cervical cancer. In these cases, your health care provider may recommend more frequent screening and Pap tests.  The HPV test is an additional test that may be used for cervical cancer screening. The HPV test looks for the virus that can cause the cell changes on the cervix. The cells collected during the Pap test can be  tested for HPV. The HPV test could be used to screen women aged 30 years and older, and should be used in women of any age who have unclear Pap test results. After the age of 30, women should have HPV testing at the same frequency as a Pap test.  Colorectal cancer can be detected and often prevented. Most routine colorectal cancer screening begins at the age of 50 years and continues through age 75 years. However, your health care provider may recommend screening at an earlier age if you have risk factors for colon cancer. On a yearly basis, your health care provider may provide home test kits to check for hidden blood in the stool. Use of a small camera at the end of a tube, to directly examine the colon (sigmoidoscopy or colonoscopy), can detect the earliest forms of colorectal cancer. Talk to your health care provider about this at age 50, when routine screening begins. Direct  exam of the colon should be repeated every 5-10 years through age 75 years, unless early forms of pre-cancerous polyps or small growths are found.  People who are at an increased risk for hepatitis B should be screened for this virus. You are considered at high risk for hepatitis B if:  You were born in a country where hepatitis B occurs often. Talk with your health care provider about which countries are considered high risk.  Your parents were born in a high-risk country and you have not received a shot to protect against hepatitis B (hepatitis B vaccine).  You have HIV or AIDS.  You use needles to inject street drugs.  You live with, or have sex with, someone who has hepatitis B.  You get hemodialysis treatment.  You take certain medicines for conditions like cancer, organ transplantation, and autoimmune conditions.  Hepatitis C blood testing is recommended for all people born from 1945 through 1965 and any individual with known risks for hepatitis C.  Practice safe sex. Use condoms and avoid high-risk sexual practices to reduce the spread of sexually transmitted infections (STIs). STIs include gonorrhea, chlamydia, syphilis, trichomonas, herpes, HPV, and human immunodeficiency virus (HIV). Herpes, HIV, and HPV are viral illnesses that have no cure. They can result in disability, cancer, and death.  You should be screened for sexually transmitted illnesses (STIs) including gonorrhea and chlamydia if:  You are sexually active and are younger than 24 years.  You are older than 24 years and your health care provider tells you that you are at risk for this type of infection.  Your sexual activity has changed since you were last screened and you are at an increased risk for chlamydia or gonorrhea. Ask your health care provider if you are at risk.  If you are at risk of being infected with HIV, it is recommended that you take a prescription medicine daily to prevent HIV infection. This is  called preexposure prophylaxis (PrEP). You are considered at risk if:  You are a heterosexual woman, are sexually active, and are at increased risk for HIV infection.  You take drugs by injection.  You are sexually active with a partner who has HIV.  Talk with your health care provider about whether you are at high risk of being infected with HIV. If you choose to begin PrEP, you should first be tested for HIV. You should then be tested every 3 months for as long as you are taking PrEP.  Osteoporosis is a disease in which the bones lose minerals and strength   with aging. This can result in serious bone fractures or breaks. The risk of osteoporosis can be identified using a bone density scan. Women ages 65 years and over and women at risk for fractures or osteoporosis should discuss screening with their health care providers. Ask your health care provider whether you should take a calcium supplement or vitamin D to reduce the rate of osteoporosis.  Menopause can be associated with physical symptoms and risks. Hormone replacement therapy is available to decrease symptoms and risks. You should talk to your health care provider about whether hormone replacement therapy is right for you.  Use sunscreen. Apply sunscreen liberally and repeatedly throughout the day. You should seek shade when your shadow is shorter than you. Protect yourself by wearing long sleeves, pants, a wide-brimmed hat, and sunglasses year round, whenever you are outdoors.  Once a month, do a whole body skin exam, using a mirror to look at the skin on your back. Tell your health care provider of new moles, moles that have irregular borders, moles that are larger than a pencil eraser, or moles that have changed in shape or color.  Stay current with required vaccines (immunizations).  Influenza vaccine. All adults should be immunized every year.  Tetanus, diphtheria, and acellular pertussis (Td, Tdap) vaccine. Pregnant women should  receive 1 dose of Tdap vaccine during each pregnancy. The dose should be obtained regardless of the length of time since the last dose. Immunization is preferred during the 27th-36th week of gestation. An adult who has not previously received Tdap or who does not know her vaccine status should receive 1 dose of Tdap. This initial dose should be followed by tetanus and diphtheria toxoids (Td) booster doses every 10 years. Adults with an unknown or incomplete history of completing a 3-dose immunization series with Td-containing vaccines should begin or complete a primary immunization series including a Tdap dose. Adults should receive a Td booster every 10 years.  Varicella vaccine. An adult without evidence of immunity to varicella should receive 2 doses or a second dose if she has previously received 1 dose. Pregnant females who do not have evidence of immunity should receive the first dose after pregnancy. This first dose should be obtained before leaving the health care facility. The second dose should be obtained 4-8 weeks after the first dose.  Human papillomavirus (HPV) vaccine. Females aged 13-26 years who have not received the vaccine previously should obtain the 3-dose series. The vaccine is not recommended for use in pregnant females. However, pregnancy testing is not needed before receiving a dose. If a female is found to be pregnant after receiving a dose, no treatment is needed. In that case, the remaining doses should be delayed until after the pregnancy. Immunization is recommended for any person with an immunocompromised condition through the age of 26 years if she did not get any or all doses earlier. During the 3-dose series, the second dose should be obtained 4-8 weeks after the first dose. The third dose should be obtained 24 weeks after the first dose and 16 weeks after the second dose.  Zoster vaccine. One dose is recommended for adults aged 60 years or older unless certain conditions are  present.  Measles, mumps, and rubella (MMR) vaccine. Adults born before 1957 generally are considered immune to measles and mumps. Adults born in 1957 or later should have 1 or more doses of MMR vaccine unless there is a contraindication to the vaccine or there is laboratory evidence of immunity to   each of the three diseases. A routine second dose of MMR vaccine should be obtained at least 28 days after the first dose for students attending postsecondary schools, health care workers, or international travelers. People who received inactivated measles vaccine or an unknown type of measles vaccine during 1963-1967 should receive 2 doses of MMR vaccine. People who received inactivated mumps vaccine or an unknown type of mumps vaccine before 1979 and are at high risk for mumps infection should consider immunization with 2 doses of MMR vaccine. For females of childbearing age, rubella immunity should be determined. If there is no evidence of immunity, females who are not pregnant should be vaccinated. If there is no evidence of immunity, females who are pregnant should delay immunization until after pregnancy. Unvaccinated health care workers born before 1957 who lack laboratory evidence of measles, mumps, or rubella immunity or laboratory confirmation of disease should consider measles and mumps immunization with 2 doses of MMR vaccine or rubella immunization with 1 dose of MMR vaccine.  Pneumococcal 13-valent conjugate (PCV13) vaccine. When indicated, a person who is uncertain of her immunization history and has no record of immunization should receive the PCV13 vaccine. An adult aged 19 years or older who has certain medical conditions and has not been previously immunized should receive 1 dose of PCV13 vaccine. This PCV13 should be followed with a dose of pneumococcal polysaccharide (PPSV23) vaccine. The PPSV23 vaccine dose should be obtained at least 8 weeks after the dose of PCV13 vaccine. An adult aged 19  years or older who has certain medical conditions and previously received 1 or more doses of PPSV23 vaccine should receive 1 dose of PCV13. The PCV13 vaccine dose should be obtained 1 or more years after the last PPSV23 vaccine dose.  Pneumococcal polysaccharide (PPSV23) vaccine. When PCV13 is also indicated, PCV13 should be obtained first. All adults aged 65 years and older should be immunized. An adult younger than age 65 years who has certain medical conditions should be immunized. Any person who resides in a nursing home or long-term care facility should be immunized. An adult smoker should be immunized. People with an immunocompromised condition and certain other conditions should receive both PCV13 and PPSV23 vaccines. People with human immunodeficiency virus (HIV) infection should be immunized as soon as possible after diagnosis. Immunization during chemotherapy or radiation therapy should be avoided. Routine use of PPSV23 vaccine is not recommended for American Indians, [place] Natives, or people younger than 65 years unless there are medical conditions that require PPSV23 vaccine. When indicated, people who have unknown immunization and have no record of immunization should receive PPSV23 vaccine. One-time revaccination 5 years after the first dose of PPSV23 is recommended for people aged 19-64 years who have chronic kidney failure, nephrotic syndrome, asplenia, or immunocompromised conditions. People who received 1-2 doses of PPSV23 before age 65 years should receive another dose of PPSV23 vaccine at age 65 years or later if at least 5 years have passed since the previous dose. Doses of PPSV23 are not needed for people immunized with PPSV23 at or after age 65 years.  Meningococcal vaccine. Adults with asplenia or persistent complement component deficiencies should receive 2 doses of quadrivalent meningococcal conjugate (MenACWY-D) vaccine. The doses should be obtained at least 2 months apart.  Microbiologists working with certain meningococcal bacteria, military recruits, people at risk during an outbreak, and people who travel to or live in countries with a high rate of meningitis should be immunized. A first-year college student up through age   21 years who is living in a residence hall should receive a dose if she did not receive a dose on or after her 16th birthday. Adults who have certain high-risk conditions should receive one or more doses of vaccine.  Hepatitis A vaccine. Adults who wish to be protected from this disease, have certain high-risk conditions, work with hepatitis A-infected animals, work in hepatitis A research labs, or travel to or work in countries with a high rate of hepatitis A should be immunized. Adults who were previously unvaccinated and who anticipate close contact with an international adoptee during the first 60 days after arrival in the [place] from a country with a high rate of hepatitis A should be immunized.  Hepatitis B vaccine. Adults who wish to be protected from this disease, have certain high-risk conditions, may be exposed to blood or other infectious body fluids, are household contacts or sex partners of hepatitis B positive people, are clients or workers in certain care facilities, or travel to or work in countries with a high rate of hepatitis B should be immunized.  Haemophilus influenzae type b (Hib) vaccine. A previously unvaccinated person with asplenia or sickle cell disease or having a scheduled splenectomy should receive 1 dose of Hib vaccine. Regardless of previous immunization, a recipient of a hematopoietic stem cell transplant should receive a 3-dose series 6-12 months after her successful transplant. Hib vaccine is not recommended for adults with HIV infection. Preventive Services / Frequency Ages 64 to 68 years  Blood pressure check.** / Every 1 to 2 years.  Lipid and cholesterol check.** / Every 5 years beginning at age  22.  Clinical breast exam.** / Every 3 years for women in their 88s and 53s.  BRCA-related cancer risk assessment.** / For women who have family members with a BRCA-related cancer (breast, ovarian, tubal, or peritoneal cancers).  Pap test.** / Every 2 years from ages 90 through 51. Every 3 years starting at age 21 through age 56 or 3 with a history of 3 consecutive normal Pap tests.  HPV screening.** / Every 3 years from ages 24 through ages 1 to 46 with a history of 3 consecutive normal Pap tests.  Hepatitis C blood test.** / For any individual with known risks for hepatitis C.  Skin self-exam. / Monthly.  Influenza vaccine. / Every year.  Tetanus, diphtheria, and acellular pertussis (Tdap, Td) vaccine.** / Consult your health care provider. Pregnant women should receive 1 dose of Tdap vaccine during each pregnancy. 1 dose of Td every 10 years.  Varicella vaccine.** / Consult your health care provider. Pregnant females who do not have evidence of immunity should receive the first dose after pregnancy.  HPV vaccine. / 3 doses over 6 months, if 72 and younger. The vaccine is not recommended for use in pregnant females. However, pregnancy testing is not needed before receiving a dose.  Measles, mumps, rubella (MMR) vaccine.** / You need at least 1 dose of MMR if you were born in 1957 or later. You may also need a 2nd dose. For females of childbearing age, rubella immunity should be determined. If there is no evidence of immunity, females who are not pregnant should be vaccinated. If there is no evidence of immunity, females who are pregnant should delay immunization until after pregnancy.  Pneumococcal 13-valent conjugate (PCV13) vaccine.** / Consult your health care provider.  Pneumococcal polysaccharide (PPSV23) vaccine.** / 1 to 2 doses if you smoke cigarettes or if you have certain conditions.  Meningococcal vaccine.** /  1 dose if you are age 19 to 21 years and a first-year college  student living in a residence hall, or have one of several medical conditions, you need to get vaccinated against meningococcal disease. You may also need additional booster doses.  Hepatitis A vaccine.** / Consult your health care provider.  Hepatitis B vaccine.** / Consult your health care provider.  Haemophilus influenzae type b (Hib) vaccine.** / Consult your health care provider. Ages 40 to 64 years  Blood pressure check.** / Every 1 to 2 years.  Lipid and cholesterol check.** / Every 5 years beginning at age 20 years.  Lung cancer screening. / Every year if you are aged 55-80 years and have a 30-pack-year history of smoking and currently smoke or have quit within the past 15 years. Yearly screening is stopped once you have quit smoking for at least 15 years or develop a health problem that would prevent you from having lung cancer treatment.  Clinical breast exam.** / Every year after age 40 years.  BRCA-related cancer risk assessment.** / For women who have family members with a BRCA-related cancer (breast, ovarian, tubal, or peritoneal cancers).  Mammogram.** / Every year beginning at age 40 years and continuing for as long as you are in good health. Consult with your health care provider.  Pap test.** / Every 3 years starting at age 30 years through age 65 or 70 years with a history of 3 consecutive normal Pap tests.  HPV screening.** / Every 3 years from ages 30 years through ages 65 to 70 years with a history of 3 consecutive normal Pap tests.  Fecal occult blood test (FOBT) of stool. / Every year beginning at age 50 years and continuing until age 75 years. You may not need to do this test if you get a colonoscopy every 10 years.  Flexible sigmoidoscopy or colonoscopy.** / Every 5 years for a flexible sigmoidoscopy or every 10 years for a colonoscopy beginning at age 50 years and continuing until age 75 years.  Hepatitis C blood test.** / For all people born from 1945 through  1965 and any individual with known risks for hepatitis C.  Skin self-exam. / Monthly.  Influenza vaccine. / Every year.  Tetanus, diphtheria, and acellular pertussis (Tdap/Td) vaccine.** / Consult your health care provider. Pregnant women should receive 1 dose of Tdap vaccine during each pregnancy. 1 dose of Td every 10 years.  Varicella vaccine.** / Consult your health care provider. Pregnant females who do not have evidence of immunity should receive the first dose after pregnancy.  Zoster vaccine.** / 1 dose for adults aged 60 years or older.  Measles, mumps, rubella (MMR) vaccine.** / You need at least 1 dose of MMR if you were born in 1957 or later. You may also need a 2nd dose. For females of childbearing age, rubella immunity should be determined. If there is no evidence of immunity, females who are not pregnant should be vaccinated. If there is no evidence of immunity, females who are pregnant should delay immunization until after pregnancy.  Pneumococcal 13-valent conjugate (PCV13) vaccine.** / Consult your health care provider.  Pneumococcal polysaccharide (PPSV23) vaccine.** / 1 to 2 doses if you smoke cigarettes or if you have certain conditions.  Meningococcal vaccine.** / Consult your health care provider.  Hepatitis A vaccine.** / Consult your health care provider.  Hepatitis B vaccine.** / Consult your health care provider.  Haemophilus influenzae type b (Hib) vaccine.** / Consult your health care provider. Ages 65   years and over  Blood pressure check.** / Every 1 to 2 years.  Lipid and cholesterol check.** / Every 5 years beginning at age 22 years.  Lung cancer screening. / Every year if you are aged 73-80 years and have a 30-pack-year history of smoking and currently smoke or have quit within the past 15 years. Yearly screening is stopped once you have quit smoking for at least 15 years or develop a health problem that would prevent you from having lung cancer  treatment.  Clinical breast exam.** / Every year after age 4 years.  BRCA-related cancer risk assessment.** / For women who have family members with a BRCA-related cancer (breast, ovarian, tubal, or peritoneal cancers).  Mammogram.** / Every year beginning at age 40 years and continuing for as long as you are in good health. Consult with your health care provider.  Pap test.** / Every 3 years starting at age 9 years through age 34 or 91 years with 3 consecutive normal Pap tests. Testing can be stopped between 65 and 70 years with 3 consecutive normal Pap tests and no abnormal Pap or HPV tests in the past 10 years.  HPV screening.** / Every 3 years from ages 57 years through ages 64 or 45 years with a history of 3 consecutive normal Pap tests. Testing can be stopped between 65 and 70 years with 3 consecutive normal Pap tests and no abnormal Pap or HPV tests in the past 10 years.  Fecal occult blood test (FOBT) of stool. / Every year beginning at age 15 years and continuing until age 17 years. You may not need to do this test if you get a colonoscopy every 10 years.  Flexible sigmoidoscopy or colonoscopy.** / Every 5 years for a flexible sigmoidoscopy or every 10 years for a colonoscopy beginning at age 86 years and continuing until age 71 years.  Hepatitis C blood test.** / For all people born from [birth year] through 1963 and any individual with known risks for hepatitis C.  Osteoporosis screening.** / A one-time screening for women ages 83 years and over and women at risk for fractures or osteoporosis.  Skin self-exam. / Monthly.  Influenza vaccine. / Every year.  Tetanus, diphtheria, and acellular pertussis (Tdap/Td) vaccine.** / 1 dose of Td every 10 years.  Varicella vaccine.** / Consult your health care provider.  Zoster vaccine.** / 1 dose for adults aged 61 years or older.  Pneumococcal 13-valent conjugate (PCV13) vaccine.** / Consult your health care provider.  Pneumococcal  polysaccharide (PPSV23) vaccine.** / 1 dose for all adults aged 28 years and older.  Meningococcal vaccine.** / Consult your health care provider.  Hepatitis A vaccine.** / Consult your health care provider.  Hepatitis B vaccine.** / Consult your health care provider.  Haemophilus influenzae type b (Hib) vaccine.** / Consult your health care provider. ** Family history and personal history of risk and conditions may change your health care provider's recommendations. Document Released: 09/12/2001 Document Revised: 12/01/2013 Document Reviewed: 12/12/2010 [facility] Patient Information 2015 [place], [place]. This information is not intended to replace advice given to you by your health care provider. Make sure you discuss any questions you have with your health care provider.

## 2015-04-12 NOTE — Assessment & Plan Note (Signed)
No concerns today. Pap today, no concerns on exam.

## 2015-04-12 NOTE — Assessment & Plan Note (Signed)
Work up reassuring. Asymptomatic and feeling well

## 2015-04-12 NOTE — Assessment & Plan Note (Signed)
Well controlled. Encouraged heart healthy diet such as the DASH diet and exercise as tolerated.  

## 2015-04-12 NOTE — Assessment & Plan Note (Signed)
RRR today, patient was asymptomatic when this occurred

## 2015-04-12 NOTE — Assessment & Plan Note (Signed)
Avoid offending foods, start probiotics. Do not eat large meals in late evening and consider raising head of bed.  

## 2015-04-13 LAB — CYTOLOGY - PAP

## 2015-04-25 NOTE — Progress Notes (Signed)
Subjective:    Patient ID: Sheryl [name], female    DOB: 03-13-1961, 54 y.o.   MRN: [id number]  Chief Complaint  Patient presents with  . Pap only    unknown last date, would like [facility] arranged    HPI Patient is in today for Pap smear. When she was in for her annual exam she had significant tachycardia and was sent to the emergency room. Today she reports feeling well. She's had no sense of palpitations throughout. She's had no recent illness. She denies any recent GYN concerns. Denies CP/palp/SOB/HA/congestion/fevers/GI or GU c/o. Taking meds as prescribed  Past Medical History  Diagnosis Date  . Chicken pox as a child  . Anxiety   . GERD (gastroesophageal reflux disease)   . Depression   . Overweight(278.02)   . Anemia   . Preventative health care 06/07/2011  . FH: colon cancer 06/07/2011  . Elevated BP 02/06/2012  . Hyperlipidemia 05/08/2012  . Sacral pain 05/08/2012  . Vitamin D deficiency 05/09/2014  . Tachycardia 11/10/2014    Past Surgical History  Procedure Laterality Date  . Gum surgery  2007  . Right hand surgery  2009    infection debrided  . Cesarean section  1985 and 1989    X 2  . Colonoscopy      Family History  Problem Relation Age of Onset  . Heart attack Mother   . Diabetes Mother     type 2  . Hypertension Mother   . Hyperlipidemia Mother   . Heart disease Mother   . Cancer Father     colon  . Heart attack Father   . Colon cancer Father   . Hypertension Sister   . Diabetes Sister     type 2  . Hypertension Sister   . Diabetes Maternal Grandmother   . Hypertension Maternal Grandmother   . Hyperlipidemia Maternal Grandmother     Social History   Social History  . Marital Status: Married    Spouse Name: N/A  . Number of Children: N/A  . Years of Education: N/A   Occupational History  . Not on file.   Social History Main Topics  . Smoking status: Never Smoker   . Smokeless tobacco: Never Used  . Alcohol Use: 21.0 oz/week    14  Standard drinks or equivalent, 21 Glasses of wine per week     Comment: glass of wine nightly  . Drug Use: No  . Sexual Activity:    Partners: Male     Comment: no dietary restrictions, lives with husband   Other Topics Concern  . Not on file   Social History Narrative    Outpatient Prescriptions Prior to Visit  Medication Sig Dispense Refill  . atorvastatin (LIPITOR) 10 MG tablet Take 0.5 tablets (5 mg total) by mouth every other day. 90 tablet 3  . Coenzyme Q10 (CO Q 10 PO) Take 1 capsule by mouth daily.    . ergocalciferol (VITAMIN D2) 50000 UNITS capsule Take 1 capsule (50,000 Units total) by mouth once a week. 12 capsule 0  . Krill Oil CAPS 1 cap daily MegaRed by Schiff    . metoprolol succinate (TOPROL XL) 25 MG 24 hr tablet Take 1 tablet (25 mg total) by mouth daily. 30 tablet 11  . Multiple Vitamin (MULTIVITAMIN) capsule Take 1 capsule by mouth daily.      . sertraline (ZOLOFT) 100 MG tablet Take 1.5 tablets (150 mg total) by mouth daily. 135 tablet 1  .  ALPRAZolam (XANAX) 0.5 MG tablet TAKE 1/2 TO 1 TABLET BY MOUTH TWICE A DAY AS NEEDED FOR ANXIETY 60 tablet 0  . pantoprazole (PROTONIX) 40 MG tablet Take 1 tablet (40 mg total) by mouth daily as needed. 90 tablet 1   No facility-administered medications prior to visit.    Allergies  Allergen Reactions  . Penicillins Rash    Review of Systems  Constitutional: Negative for fever and malaise/fatigue.  HENT: Negative for congestion.   Eyes: Negative for discharge.  Respiratory: Negative for shortness of breath.   Cardiovascular: Negative for chest pain, palpitations and leg swelling.  Gastrointestinal: Negative for nausea and abdominal pain.  Genitourinary: Negative for dysuria.  Musculoskeletal: Negative for falls.  Skin: Negative for rash.  Neurological: Negative for loss of consciousness and headaches.  Endo/Heme/Allergies: Negative for environmental allergies.  Psychiatric/Behavioral: Negative for depression. The  patient is not nervous/anxious.        Objective:    Physical Exam  Constitutional: She is oriented to person, place, and time. She appears well-developed and well-nourished. No distress.  HENT:  Head: Normocephalic and atraumatic.  Nose: Nose normal.  Eyes: Right eye exhibits no discharge. Left eye exhibits no discharge.  Neck: Normal range of motion. Neck supple.  Cardiovascular: Normal rate and regular rhythm.   No murmur heard. Pulmonary/Chest: Effort normal and breath sounds normal.  Abdominal: Soft. Bowel sounds are normal. There is no tenderness.  Genitourinary: Vagina normal and uterus normal. No vaginal discharge found.  No concerns on exam, no internal or external vaginal lesions. Breast exam wnl. No masses, skin changes or discharge b/l  Musculoskeletal: She exhibits no edema.  Neurological: She is alert and oriented to person, place, and time.  Skin: Skin is warm and dry.  Psychiatric: She has a normal mood and affect.  Nursing note and vitals reviewed.   BP 118/64 mmHg  Pulse 72  Temp(Src) 98.4 F (36.9 C) (Oral)  Resp 16  Ht 5\' 2"  (1.575 m)  Wt 162 lb (73.483 kg)  BMI 29.62 kg/m2  SpO2 97%  LMP 09/08/2011 Wt Readings from Last 3 Encounters:  04/12/15 162 lb (73.483 kg)  02/12/15 161 lb (73.029 kg)  01/18/15 161 lb (73.029 kg)     Lab Results  Component Value Date   WBC 4.2 11/10/2014   HGB 15.3* 11/10/2014   HCT 45.1 11/10/2014   PLT 140* 11/10/2014   GLUCOSE 89 04/12/2015   CHOL 260* 04/12/2015   TRIG 93.0 04/12/2015   HDL 131.90 04/12/2015   LDLDIRECT 103.8 01/10/2013   LDLCALC 109* 04/12/2015   ALT 41* 04/12/2015   AST 46* 04/12/2015   NA 140 04/12/2015   K 4.1 04/12/2015   CL 106 04/12/2015   CREATININE 0.84 04/12/2015   BUN 18 04/12/2015   CO2 26 04/12/2015   TSH 2.68 11/16/2014   HGBA1C 5.0 12/07/2014    Lab Results  Component Value Date   TSH 2.68 11/16/2014   Lab Results  Component Value Date   WBC 4.2 11/10/2014   HGB  15.3* 11/10/2014   HCT 45.1 11/10/2014   MCV 87.6 11/10/2014   PLT 140* 11/10/2014   Lab Results  Component Value Date   NA 140 04/12/2015   K 4.1 04/12/2015   CO2 26 04/12/2015   GLUCOSE 89 04/12/2015   BUN 18 04/12/2015   CREATININE 0.84 04/12/2015   BILITOT 0.8 04/12/2015   ALKPHOS 99 04/12/2015   AST 46* 04/12/2015   ALT 41* 04/12/2015   PROT 6.8  04/12/2015   ALBUMIN 4.0 04/12/2015   CALCIUM 10.4 04/12/2015   ANIONGAP 7 11/10/2014   GFR 75.68 04/12/2015   Lab Results  Component Value Date   CHOL 260* 04/12/2015   Lab Results  Component Value Date   HDL 131.90 04/12/2015   Lab Results  Component Value Date   LDLCALC 109* 04/12/2015   Lab Results  Component Value Date   TRIG 93.0 04/12/2015   Lab Results  Component Value Date   CHOLHDL 2 04/12/2015   Lab Results  Component Value Date   HGBA1C 5.0 12/07/2014       Assessment & Plan:   Problem List Items Addressed This Visit    Vitamin D deficiency    taing the 50000 dose weekly encouraged 2000 IU daily as well      Relevant Orders   Comprehensive metabolic panel (Completed)   Lipid panel (Completed)   Tachycardia    RRR today, patient was asymptomatic when this occurred      Palpitation    Work up reassuring. Asymptomatic and feeling well      Relevant Orders   Comprehensive metabolic panel (Completed)   Lipid panel (Completed)   Hypercalcemia    Following with endocrinology now. asymptomatic      GERD (gastroesophageal reflux disease)    Avoid offending foods, start probiotics. Do not eat large meals in late evening and consider raising head of bed.       Relevant Medications   pantoprazole (PROTONIX) 40 MG tablet   Elevated BP    Well controlled. Encouraged heart healthy diet such as the DASH diet and exercise as tolerated.       Relevant Orders   Comprehensive metabolic panel (Completed)   Lipid panel (Completed)   Cervical cancer screening    No concerns today. Pap today, no  concerns on exam.       Relevant Orders   Comprehensive metabolic panel (Completed)   Lipid panel (Completed)   Cytology - PAP (Completed)    Other Visit Diagnoses    Encounter for immunization    -  Primary    Reflux        Relevant Medications    pantoprazole (PROTONIX) 40 MG tablet    Other Relevant Orders    Comprehensive metabolic panel (Completed)    Lipid panel (Completed)    Hyperlipidemia, mixed        Relevant Orders    Comprehensive metabolic panel (Completed)    Lipid panel (Completed)    Anxiety state        Relevant Medications    ALPRAZolam (XANAX) 0.5 MG tablet       I am having Ms. Blunck maintain her multivitamin, Krill Oil, Coenzyme Q10 (CO Q 10 PO), ergocalciferol, sertraline, atorvastatin, metoprolol succinate, pantoprazole, and ALPRAZolam.  Meds ordered this encounter  Medications  . pantoprazole (PROTONIX) 40 MG tablet    Sig: Take 1 tablet (40 mg total) by mouth daily as needed.    Dispense:  90 tablet    Refill:  1    DISCONTINUE ALL PREVIOUS REFILLS FOR THIS MEDICATION  . ALPRAZolam (XANAX) 0.5 MG tablet    Sig: TAKE 1/2 TO 1 TABLET BY MOUTH TWICE A DAY AS NEEDED FOR ANXIETY    Dispense:  60 tablet    Refill:  3    Not to exceed 5 additional fills before 07/18/2015.     Penni Homans, MD

## 2015-07-12 ENCOUNTER — Other Ambulatory Visit: Payer: 59

## 2015-07-19 ENCOUNTER — Ambulatory Visit: Payer: 59 | Admitting: Endocrinology

## 2015-08-10 ENCOUNTER — Other Ambulatory Visit: Payer: Self-pay | Admitting: Family Medicine

## 2015-08-10 NOTE — Telephone Encounter (Signed)
[facility] [address] 

## 2015-08-10 NOTE — Telephone Encounter (Signed)
Requesting: Alprazolam Contract   05/08/2014 UDS  Low risk  Last OV   04/12/2015 Last Refill   #60 with 3 refills on 04/12/2015  Please Advise

## 2015-08-10 NOTE — Telephone Encounter (Signed)
[facility]

## 2015-08-24 ENCOUNTER — Other Ambulatory Visit: Payer: Self-pay | Admitting: Family Medicine

## 2015-09-17 ENCOUNTER — Other Ambulatory Visit: Payer: Self-pay | Admitting: Cardiology

## 2015-09-30 ENCOUNTER — Other Ambulatory Visit: Payer: Self-pay | Admitting: Family Medicine

## 2015-10-11 ENCOUNTER — Ambulatory Visit (INDEPENDENT_AMBULATORY_CARE_PROVIDER_SITE_OTHER): Payer: Commercial Managed Care - HMO | Admitting: Family Medicine

## 2015-10-11 ENCOUNTER — Encounter: Payer: Self-pay | Admitting: Family Medicine

## 2015-10-11 ENCOUNTER — Other Ambulatory Visit: Payer: Self-pay | Admitting: Family Medicine

## 2015-10-11 ENCOUNTER — Ambulatory Visit (HOSPITAL_BASED_OUTPATIENT_CLINIC_OR_DEPARTMENT_OTHER)
Admission: RE | Admit: 2015-10-11 | Discharge: 2015-10-11 | Disposition: A | Discharge status: Home / Self Care | Payer: Commercial Managed Care - HMO | Source: Ambulatory Visit | Attending: Family Medicine | Admitting: Family Medicine

## 2015-10-11 VITALS — BP 110/80 | HR 125 | Temp 97.7°F | Ht 62.0 in | Wt 163.2 lb

## 2015-10-11 DIAGNOSIS — E663 Overweight: Secondary | ICD-10-CM

## 2015-10-11 DIAGNOSIS — Z1239 Encounter for other screening for malignant neoplasm of breast: Secondary | ICD-10-CM | POA: Diagnosis present

## 2015-10-11 DIAGNOSIS — F4323 Adjustment disorder with mixed anxiety and depressed mood: Secondary | ICD-10-CM | POA: Diagnosis not present

## 2015-10-11 DIAGNOSIS — E559 Vitamin D deficiency, unspecified: Secondary | ICD-10-CM

## 2015-10-11 DIAGNOSIS — Z1231 Encounter for screening mammogram for malignant neoplasm of breast: Secondary | ICD-10-CM | POA: Diagnosis not present

## 2015-10-11 DIAGNOSIS — R000 Tachycardia, unspecified: Secondary | ICD-10-CM

## 2015-10-11 DIAGNOSIS — K219 Gastro-esophageal reflux disease without esophagitis: Secondary | ICD-10-CM

## 2015-10-11 DIAGNOSIS — R822 Biliuria: Secondary | ICD-10-CM

## 2015-10-11 DIAGNOSIS — IMO0001 Reserved for inherently not codable concepts without codable children: Secondary | ICD-10-CM

## 2015-10-11 DIAGNOSIS — R7989 Other specified abnormal findings of blood chemistry: Secondary | ICD-10-CM

## 2015-10-11 DIAGNOSIS — R030 Elevated blood-pressure reading, without diagnosis of hypertension: Secondary | ICD-10-CM

## 2015-10-11 DIAGNOSIS — E785 Hyperlipidemia, unspecified: Secondary | ICD-10-CM

## 2015-10-11 DIAGNOSIS — R945 Abnormal results of liver function studies: Secondary | ICD-10-CM

## 2015-10-11 LAB — COMPREHENSIVE METABOLIC PANEL
ALK PHOS: 101 U/L (ref 39–117)
ALT: 31 U/L (ref 0–35)
AST: 39 U/L — AB (ref 0–37)
Albumin: 4.2 g/dL (ref 3.5–5.2)
BILIRUBIN TOTAL: 0.5 mg/dL (ref 0.2–1.2)
BUN: 22 mg/dL (ref 6–23)
CO2: 27 mEq/L (ref 19–32)
CREATININE: 0.85 mg/dL (ref 0.40–1.20)
Calcium: 10.6 mg/dL — ABNORMAL HIGH (ref 8.4–10.5)
Chloride: 104 mEq/L (ref 96–112)
GFR: 74.51 mL/min (ref >60.00)
GLUCOSE: 78 mg/dL (ref 70–99)
Potassium: 4.4 mEq/L (ref 3.5–5.1)
Sodium: 137 mEq/L (ref 135–145)
TOTAL PROTEIN: 6.9 g/dL (ref 6.0–8.3)

## 2015-10-11 LAB — VITAMIN D 25 HYDROXY (VIT D DEFICIENCY, FRACTURES): VITD: 21.24 ng/mL — ABNORMAL LOW (ref 30.00–100.00)

## 2015-10-11 LAB — URINALYSIS
HGB URINE DIPSTICK: NEGATIVE
Ketones, ur: NEGATIVE
LEUKOCYTES UA: NEGATIVE
NITRITE: NEGATIVE
Specific Gravity, Urine: 1.025 (ref 1.000–1.030)
Total Protein, Urine: NEGATIVE
UROBILINOGEN UA: 1 (ref 0.0–1.0)
Urine Glucose: NEGATIVE
pH: 6 (ref 5.0–8.0)

## 2015-10-11 LAB — CBC
HCT: 43.3 % (ref 36.0–46.0)
Hemoglobin: 14.9 g/dL (ref 12.0–15.0)
MCHC: 34.4 g/dL (ref 30.0–36.0)
MCV: 88.5 fl (ref 78.0–100.0)
Platelets: 149 10*3/uL — ABNORMAL LOW (ref 150.0–400.0)
RBC: 4.9 Mil/uL (ref 3.87–5.11)
RDW: 13.8 % (ref 11.5–15.5)
WBC: 4.5 10*3/uL (ref 4.0–10.5)

## 2015-10-11 LAB — LIPID PANEL
Cholesterol: 258 mg/dL — ABNORMAL HIGH (ref 0–200)
HDL: 122.6 mg/dL (ref >39.00)
LDL Cholesterol: 115 mg/dL — ABNORMAL HIGH (ref 0–99)
NONHDL: 135.09
TRIGLYCERIDES: 99 mg/dL (ref 0.0–149.0)
Total CHOL/HDL Ratio: 2
VLDL: 19.8 mg/dL (ref 0.0–40.0)

## 2015-10-11 LAB — TSH: TSH: 2.22 u[IU]/mL (ref 0.35–4.50)

## 2015-10-11 MED ORDER — ERGOCALCIFEROL 1.25 MG (50000 UT) PO CAPS: 50000.0000 [IU] | ORAL_CAPSULE | ORAL | Status: DC

## 2015-10-11 MED ORDER — METOPROLOL SUCCINATE ER 50 MG PO TB24: 50.0000 mg | ORAL_TABLET | Freq: Every day | ORAL | Status: DC

## 2015-10-11 MED ORDER — ALPRAZOLAM 0.5 MG PO TABS: ORAL_TABLET | ORAL | Status: DC

## 2015-10-11 NOTE — Assessment & Plan Note (Signed)
Well controlled. Encouraged heart healthy diet such as the DASH diet and exercise as tolerated.  

## 2015-10-11 NOTE — Progress Notes (Signed)
Patient ID: Sheryl [name], female   DOB: 1961-04-28, 55 y.o.   MRN: [id number]   Subjective:    Patient ID: Sheryl [name], female    DOB: 11-30-1960, 55 y.o.   MRN: [id number]  Chief Complaint  Patient presents with  . Follow-up    HPI Patient is in today for follow up. She is feeling well, no recent concerns. Continues to go to [place] weekly to babysit her grandson. No recent illness. Denies CP/palp/SOB/HA/congestion/fevers/GI or GU c/o. Taking meds as prescribed  Past Medical History  Diagnosis Date  . Chicken pox as a child  . Anxiety   . GERD (gastroesophageal reflux disease)   . Depression   . Overweight(278.02)   . Anemia   . Preventative health care 06/07/2011  . FH: colon cancer 06/07/2011  . Elevated BP 02/06/2012  . Hyperlipidemia 05/08/2012  . Sacral pain 05/08/2012  . Vitamin D deficiency 05/09/2014  . Tachycardia 11/10/2014    Past Surgical History  Procedure Laterality Date  . Gum surgery  2007  . Right hand surgery  2009    infection debrided  . Cesarean section  1985 and 1989    X 2  . Colonoscopy      Family History  Problem Relation Age of Onset  . Heart attack Mother   . Diabetes Mother     type 2  . Hypertension Mother   . Hyperlipidemia Mother   . Heart disease Mother   . Cancer Father     colon  . Heart attack Father   . Colon cancer Father   . Hypertension Sister   . Diabetes Sister     type 2  . Hypertension Sister   . Diabetes Maternal Grandmother   . Hypertension Maternal Grandmother   . Hyperlipidemia Maternal Grandmother     Social History   Social History  . Marital Status: Married    Spouse Name: N/A  . Number of Children: N/A  . Years of Education: N/A   Occupational History  . Not on file.   Social History Main Topics  . Smoking status: Never Smoker   . Smokeless tobacco: Never Used  . Alcohol Use: 21.0 oz/week    14 Standard drinks or equivalent, 21 Glasses of wine per week     Comment: glass of wine  nightly  . Drug Use: No  . Sexual Activity:    Partners: Male     Comment: no dietary restrictions, lives with husband   Other Topics Concern  . Not on file   Social History Narrative    Outpatient Prescriptions Prior to Visit  Medication Sig Dispense Refill  . atorvastatin (LIPITOR) 10 MG tablet Take 0.5 tablets (5 mg total) by mouth every other day. 90 tablet 3  . Coenzyme Q10 (CO Q 10 PO) Take 1 capsule by mouth daily.    . ergocalciferol (VITAMIN D2) 50000 UNITS capsule Take 1 capsule (50,000 Units total) by mouth once a week. 12 capsule 0  . Krill Oil CAPS 1 cap daily MegaRed by Schiff    . Multiple Vitamin (MULTIVITAMIN) capsule Take 1 capsule by mouth daily.      . pantoprazole (PROTONIX) 40 MG tablet TAKE 1 TABLET (40 MG TOTAL) BY MOUTH DAILY AS NEEDED. 90 tablet 1  . sertraline (ZOLOFT) 100 MG tablet TAKE 1.5 TABLETS (150 MG TOTAL) BY MOUTH DAILY. 135 tablet 1  . ALPRAZolam (XANAX) 0.5 MG tablet TAKE 1/2-1 TABLET TWICE DAILY AS NEEDED FOR ANXIETY 60 tablet 0  .  metoprolol succinate (TOPROL-XL) 25 MG 24 hr tablet TAKE 1 TABLET (25 MG TOTAL) BY MOUTH DAILY. 30 tablet 0  . pantoprazole (PROTONIX) 40 MG tablet Take 1 tablet (40 mg total) by mouth daily as needed. 90 tablet 1  . sertraline (ZOLOFT) 100 MG tablet Take 1.5 tablets (150 mg total) by mouth daily. 135 tablet 1   No facility-administered medications prior to visit.    Allergies  Allergen Reactions  . Penicillins Rash    Review of Systems  Constitutional: Negative for fever and malaise/fatigue.  HENT: Negative for congestion.   Eyes: Negative for blurred vision.  Respiratory: Negative for shortness of breath.   Cardiovascular: Negative for chest pain, palpitations and leg swelling.  Gastrointestinal: Negative for nausea, abdominal pain and blood in stool.  Genitourinary: Negative for dysuria and frequency.  Musculoskeletal: Negative for falls.  Skin: Negative for rash.  Neurological: Negative for dizziness,  loss of consciousness and headaches.  Endo/Heme/Allergies: Negative for environmental allergies.  Psychiatric/Behavioral: Negative for depression. The patient is not nervous/anxious.        Objective:    Physical Exam  Constitutional: She is oriented to person, place, and time. She appears well-developed and well-nourished. No distress.  HENT:  Head: Normocephalic and atraumatic.  Nose: Nose normal.  Eyes: Right eye exhibits no discharge. Left eye exhibits no discharge.  Neck: Normal range of motion. Neck supple.  Cardiovascular: Normal rate and regular rhythm.   No murmur heard. Pulmonary/Chest: Effort normal and breath sounds normal.  Abdominal: Soft. Bowel sounds are normal. There is no tenderness.  Musculoskeletal: She exhibits no edema.  Neurological: She is alert and oriented to person, place, and time.  Skin: Skin is warm and dry.  Psychiatric: She has a normal mood and affect.  Nursing note and vitals reviewed.   BP 110/80 mmHg  Pulse 125  Temp(Src) 97.7 F (36.5 C) (Oral)  Ht 5\' 2"  (1.575 m)  Wt 163 lb 4 oz (74.05 kg)  BMI 29.85 kg/m2  SpO2 97%  LMP 09/08/2011 Wt Readings from Last 3 Encounters:  10/11/15 163 lb 4 oz (74.05 kg)  04/12/15 162 lb (73.483 kg)  02/12/15 161 lb (73.029 kg)     Lab Results  Component Value Date   WBC 4.2 11/10/2014   HGB 15.3* 11/10/2014   HCT 45.1 11/10/2014   PLT 140* 11/10/2014   GLUCOSE 89 04/12/2015   CHOL 260* 04/12/2015   TRIG 93.0 04/12/2015   HDL 131.90 04/12/2015   LDLDIRECT 103.8 01/10/2013   LDLCALC 109* 04/12/2015   ALT 41* 04/12/2015   AST 46* 04/12/2015   NA 140 04/12/2015   K 4.1 04/12/2015   CL 106 04/12/2015   CREATININE 0.84 04/12/2015   BUN 18 04/12/2015   CO2 26 04/12/2015   TSH 2.68 11/16/2014   HGBA1C 5.0 12/07/2014    Lab Results  Component Value Date   TSH 2.68 11/16/2014   Lab Results  Component Value Date   WBC 4.2 11/10/2014   HGB 15.3* 11/10/2014   HCT 45.1 11/10/2014   MCV  87.6 11/10/2014   PLT 140* 11/10/2014   Lab Results  Component Value Date   NA 140 04/12/2015   K 4.1 04/12/2015   CO2 26 04/12/2015   GLUCOSE 89 04/12/2015   BUN 18 04/12/2015   CREATININE 0.84 04/12/2015   BILITOT 0.8 04/12/2015   ALKPHOS 99 04/12/2015   AST 46* 04/12/2015   ALT 41* 04/12/2015   PROT 6.8 04/12/2015   ALBUMIN 4.0 04/12/2015  CALCIUM 10.4 04/12/2015   ANIONGAP 7 11/10/2014   GFR 75.68 04/12/2015   Lab Results  Component Value Date   CHOL 260* 04/12/2015   Lab Results  Component Value Date   HDL 131.90 04/12/2015   Lab Results  Component Value Date   LDLCALC 109* 04/12/2015   Lab Results  Component Value Date   TRIG 93.0 04/12/2015   Lab Results  Component Value Date   CHOLHDL 2 04/12/2015   Lab Results  Component Value Date   HGBA1C 5.0 12/07/2014       Assessment & Plan:   Problem List Items Addressed This Visit    Abnormal liver function test    Likely fatty liver, repeat levels today, minimize simple carbs      Relevant Orders   TSH   CBC   Lipid panel   Comprehensive metabolic panel   VITAMIN D 25 Hydroxy (Vit-D Deficiency, Fractures)   Bilirubin in urine   Relevant Orders   TSH   CBC   Lipid panel   Comprehensive metabolic panel   VITAMIN D 25 Hydroxy (Vit-D Deficiency, Fractures)   Urinalysis   Elevated BP    Well controlled. Encouraged heart healthy diet such as the DASH diet and exercise as tolerated.       GERD (gastroesophageal reflux disease)   Relevant Orders   TSH   CBC   Lipid panel   Comprehensive metabolic panel   VITAMIN D 25 Hydroxy (Vit-D Deficiency, Fractures)   Hyperlipidemia    Encouraged heart healthy diet, increase exercise, avoid trans fats, consider a krill oil cap daily. Not taking Lipitor at present, no trouble when she took. Recheck panel today      Relevant Medications   metoprolol succinate (TOPROL-XL) 50 MG 24 hr tablet   Other Relevant Orders   TSH   CBC   Lipid panel    Comprehensive metabolic panel   VITAMIN D 25 Hydroxy (Vit-D Deficiency, Fractures)   Overweight    Encouraged DASH diet, decrease po intake and increase exercise as tolerated. Needs 7-8 hours of sleep nightly. Avoid trans fats, eat small, frequent meals every 4-5 hours with lean proteins, complex carbs and healthy fats. Minimize simple carbs      Tachycardia - Primary    Increase metoprolol to 50 mg daily.      Relevant Orders   TSH   CBC   Lipid panel   Comprehensive metabolic panel   VITAMIN D 25 Hydroxy (Vit-D Deficiency, Fractures)   Vitamin D deficiency    Check level today, continue daily supplement she believes she takes a high dose      Relevant Orders   TSH   CBC   Lipid panel   Comprehensive metabolic panel   VITAMIN D 25 Hydroxy (Vit-D Deficiency, Fractures)    Other Visit Diagnoses    Adjustment disorder with mixed anxiety and depressed mood        Relevant Medications    ALPRAZolam (XANAX) 0.5 MG tablet    Other Relevant Orders    TSH    CBC    Lipid panel    Comprehensive metabolic panel    VITAMIN D 25 Hydroxy (Vit-D Deficiency, Fractures)    Breast cancer screening        Relevant Orders    MM Digital Screening       I have discontinued Ms. Everson's metoprolol succinate. I am also having her start on metoprolol succinate. Additionally, I am having her maintain her multivitamin, Javier Docker  Oil, Coenzyme Q10 (CO Q 10 PO), ergocalciferol, atorvastatin, sertraline, pantoprazole, and ALPRAZolam.  Meds ordered this encounter  Medications  . ALPRAZolam (XANAX) 0.5 MG tablet    Sig: TAKE 1/2-1 TABLET TWICE DAILY AS NEEDED FOR ANXIETY    Dispense:  60 tablet    Refill:  0    Not to exceed 5 additional fills before 10/03/2015  . metoprolol succinate (TOPROL-XL) 50 MG 24 hr tablet    Sig: Take 1 tablet (50 mg total) by mouth daily. Take with or immediately following a meal.    Dispense:  90 tablet    Refill:  1     Shanyla Marconi, MD

## 2015-10-11 NOTE — Assessment & Plan Note (Signed)
Likely fatty liver, repeat levels today, minimize simple carbs

## 2015-10-11 NOTE — Patient Instructions (Signed)
Nonspecific Tachycardia Tachycardia is a faster than normal heartbeat (more than 100 beats per minute). In adults, the heart normally beats between 60 and 100 times a minute. A fast heartbeat may be a normal response to exercise or stress. It does not necessarily mean that something is wrong. However, sometimes when your heart beats too fast it may not be able to pump enough blood to the rest of your body. This can result in chest pain, shortness of breath, dizziness, and even fainting. Nonspecific tachycardia means that the specific cause or pattern of your tachycardia is unknown. CAUSES  Tachycardia may be harmless or it may be due to a more serious underlying cause. Possible causes of tachycardia include:  Exercise or exertion.  Fever.  Pain or injury.  Infection.  Loss of body fluids (dehydration).  Overactive thyroid.  Lack of red blood cells (anemia).  Anxiety and stress.  Alcohol.  Caffeine.  Tobacco products.  Diet pills.  Illegal drugs.  Heart disease. SYMPTOMS  Rapid or irregular heartbeat (palpitations).  Suddenly feeling your heart beating (cardiac awareness).  Dizziness.  Tiredness (fatigue).  Shortness of breath.  Chest pain.  Nausea.  Fainting. DIAGNOSIS  Your caregiver will perform a physical exam and take your medical history. In some cases, a heart specialist (cardiologist) may be consulted. Your caregiver may also order:  Blood tests.  Electrocardiography. This test records the electrical activity of your heart.  A heart monitoring test. TREATMENT  Treatment will depend on the likely cause of your tachycardia. The goal is to treat the underlying cause of your tachycardia. Treatment methods may include:  Replacement of fluids or blood through an intravenous (IV) tube for moderate to severe dehydration or anemia.  New medicines or changes in your current medicines.  Diet and lifestyle changes.  Treatment for certain  infections.  Stress relief or relaxation methods. HOME CARE INSTRUCTIONS   Rest.  Drink enough fluids to keep your urine clear or pale yellow.  Do not smoke.  Avoid:  Caffeine.  Tobacco.  Alcohol.  Chocolate.  Stimulants such as over-the-counter diet pills or pills that help you stay awake.  Situations that cause anxiety or stress.  Illegal drugs such as marijuana, phencyclidine (PCP), and cocaine.  Only take medicine as directed by your caregiver.  Keep all follow-up appointments as directed by your caregiver. SEEK IMMEDIATE MEDICAL CARE IF:   You have pain in your chest, upper arms, jaw, or neck.  You become weak, dizzy, or feel faint.  You have palpitations that will not go away.  You vomit, have diarrhea, or pass blood in your stool.  Your skin is cool, pale, and wet.  You have a fever that will not go away with rest, fluids, and medicine. MAKE SURE YOU:   Understand these instructions.  Will watch your condition.  Will get help right away if you are not doing well or get worse.   This information is not intended to replace advice given to you by your health care provider. Make sure you discuss any questions you have with your health care provider.   Document Released: 08/24/2004 Document Revised: 10/09/2011 Document Reviewed: 01/29/2015 Elsevier Interactive Patient Education 2016 Elsevier Inc.  

## 2015-10-11 NOTE — Assessment & Plan Note (Signed)
Encouraged heart healthy diet, increase exercise, avoid trans fats, consider a krill oil cap daily. Not taking Lipitor at present, no trouble when she took. Recheck panel today

## 2015-10-11 NOTE — Assessment & Plan Note (Signed)
Encouraged DASH diet, decrease po intake and increase exercise as tolerated. Needs 7-8 hours of sleep nightly. Avoid trans fats, eat small, frequent meals every 4-5 hours with lean proteins, complex carbs and healthy fats. Minimize simple carbs 

## 2015-10-11 NOTE — Progress Notes (Signed)
Pre visit review using our clinic review tool, if applicable. No additional management support is needed unless otherwise documented below in the visit note. 

## 2015-10-11 NOTE — Assessment & Plan Note (Signed)
Increase metoprolol to 50 mg daily.

## 2015-10-11 NOTE — Assessment & Plan Note (Signed)
Check level today, continue daily supplement she believes she takes a high dose

## 2015-12-24 ENCOUNTER — Other Ambulatory Visit: Payer: Self-pay | Admitting: Family Medicine

## 2015-12-24 DIAGNOSIS — F4323 Adjustment disorder with mixed anxiety and depressed mood: Secondary | ICD-10-CM

## 2015-12-24 MED ORDER — ALPRAZOLAM 0.5 MG PO TABS: ORAL_TABLET | ORAL | Status: DC

## 2016-02-16 ENCOUNTER — Other Ambulatory Visit: Payer: Self-pay | Admitting: Family Medicine

## 2016-02-20 ENCOUNTER — Other Ambulatory Visit: Payer: Self-pay | Admitting: Family Medicine

## 2016-02-20 DIAGNOSIS — F4323 Adjustment disorder with mixed anxiety and depressed mood: Secondary | ICD-10-CM

## 2016-02-21 NOTE — Telephone Encounter (Signed)
Faxed hardcopy to [facility] on [address].

## 2016-03-19 ENCOUNTER — Other Ambulatory Visit: Payer: Self-pay | Admitting: Cardiology

## 2016-04-14 ENCOUNTER — Encounter: Payer: Commercial Managed Care - HMO | Admitting: Family Medicine

## 2016-04-24 ENCOUNTER — Other Ambulatory Visit: Payer: Self-pay | Admitting: Family Medicine

## 2016-04-24 DIAGNOSIS — F4323 Adjustment disorder with mixed anxiety and depressed mood: Secondary | ICD-10-CM

## 2016-04-25 NOTE — Telephone Encounter (Signed)
Requesting: Alprazolam Contract  Signed on 05/08/2014 UDS  Low risk Last OV    10/11/2015 Last Refill   #60 with 0 refills on 02/21/2016  Please Advise

## 2016-04-25 NOTE — Telephone Encounter (Signed)
I will approve on refill but she will need another appt to get any more it has been six months since I saw her

## 2016-04-25 NOTE — Telephone Encounter (Signed)
Faxed al[facility]razolam hardcopy to [facility] [address] [place]

## 2016-06-25 ENCOUNTER — Other Ambulatory Visit: Payer: Self-pay | Admitting: Family Medicine

## 2016-06-25 DIAGNOSIS — F4323 Adjustment disorder with mixed anxiety and depressed mood: Secondary | ICD-10-CM

## 2016-06-26 NOTE — Telephone Encounter (Signed)
Printed and on counter for signature (PCP approved) Once signed will fax hardcopy for [facility] to [facility] [address]. PCP approved this refill on 06/26/16, but she is out of the office and will return on 06/27/16 will then fax refill.

## 2016-06-27 ENCOUNTER — Other Ambulatory Visit: Payer: Self-pay | Admitting: Family Medicine

## 2016-07-03 ENCOUNTER — Other Ambulatory Visit: Payer: Self-pay | Admitting: Family Medicine

## 2016-08-19 ENCOUNTER — Other Ambulatory Visit: Payer: Self-pay | Admitting: Cardiology

## 2016-08-19 ENCOUNTER — Other Ambulatory Visit: Payer: Self-pay | Admitting: Family Medicine

## 2016-08-19 DIAGNOSIS — F4323 Adjustment disorder with mixed anxiety and depressed mood: Secondary | ICD-10-CM

## 2016-08-21 NOTE — Telephone Encounter (Signed)
Requesting:   Alprazolam Contract   05/08/2014 UDS   Low risk Last OV    10/11/2015 Last Refill    #60 with 0 refills on 06/26/2016  Please Advise

## 2016-08-21 NOTE — Telephone Encounter (Signed)
Printed #10 as instructed and called the patient informed to schedule appt.

## 2016-08-21 NOTE — Telephone Encounter (Signed)
She has not been in in nearly a year and she has to be seen every 6 months to keep this med active for call in for refills. She can have 10 tabs til seen but then needs appt and new contract and UDS

## 2016-08-22 NOTE — Telephone Encounter (Signed)
Medication Detail    Disp Refills Start End   metoprolol succinate (TOPROL-XL) 50 MG 24 hr tablet 90 tablet 1 07/03/2016    Sig: TAKE 1 TABLET BY MOUTH DAILY WITH OR IMMEDIATELY FOLLOWING A MEAL   E-Prescribing Status: Receipt confirmed by pharmacy (07/03/2016 7:42 AM EST)   Pharmacy   [id number] - [place], [place] [facility] [address]

## 2016-09-15 ENCOUNTER — Ambulatory Visit (INDEPENDENT_AMBULATORY_CARE_PROVIDER_SITE_OTHER): Payer: Commercial Managed Care - HMO | Admitting: Family Medicine

## 2016-09-15 ENCOUNTER — Encounter: Payer: Self-pay | Admitting: Family Medicine

## 2016-09-15 VITALS — BP 126/82 | HR 76 | Temp 98.4°F | Wt 162.4 lb

## 2016-09-15 DIAGNOSIS — D649 Anemia, unspecified: Secondary | ICD-10-CM

## 2016-09-15 DIAGNOSIS — F4323 Adjustment disorder with mixed anxiety and depressed mood: Secondary | ICD-10-CM | POA: Diagnosis not present

## 2016-09-15 DIAGNOSIS — R7989 Other specified abnormal findings of blood chemistry: Secondary | ICD-10-CM | POA: Insufficient documentation

## 2016-09-15 DIAGNOSIS — E559 Vitamin D deficiency, unspecified: Secondary | ICD-10-CM | POA: Diagnosis not present

## 2016-09-15 DIAGNOSIS — K219 Gastro-esophageal reflux disease without esophagitis: Secondary | ICD-10-CM

## 2016-09-15 DIAGNOSIS — R399 Unspecified symptoms and signs involving the genitourinary system: Secondary | ICD-10-CM

## 2016-09-15 DIAGNOSIS — E663 Overweight: Secondary | ICD-10-CM

## 2016-09-15 DIAGNOSIS — E782 Mixed hyperlipidemia: Secondary | ICD-10-CM

## 2016-09-15 DIAGNOSIS — R002 Palpitations: Secondary | ICD-10-CM

## 2016-09-15 DIAGNOSIS — R000 Tachycardia, unspecified: Secondary | ICD-10-CM

## 2016-09-15 DIAGNOSIS — R945 Abnormal results of liver function studies: Secondary | ICD-10-CM

## 2016-09-15 HISTORY — DX: Other specified abnormal findings of blood chemistry: R79.89

## 2016-09-15 LAB — CBC
HCT: 46.3 % — ABNORMAL HIGH (ref 35.0–45.0)
Hemoglobin: 15.5 g/dL (ref 11.7–15.5)
MCH: 29.8 pg (ref 27.0–33.0)
MCHC: 33.5 g/dL (ref 32.0–36.0)
MCV: 88.9 fL (ref 80.0–100.0)
MPV: 10.5 fL (ref 7.5–12.5)
PLATELETS: 165 10*3/uL (ref 140–400)
RBC: 5.21 MIL/uL — ABNORMAL HIGH (ref 3.80–5.10)
RDW: 14.3 % (ref 11.0–15.0)
WBC: 5 10*3/uL (ref 3.8–10.8)

## 2016-09-15 LAB — TSH: TSH: 2.2 mIU/L

## 2016-09-15 MED ORDER — ALPRAZOLAM 0.5 MG PO TABS: ORAL_TABLET | ORAL | 0 refills | Status: DC

## 2016-09-15 NOTE — Patient Instructions (Addendum)
Lidocaine patch and moist heat Back Pain, Adult Back pain is very common in adults.The cause of back pain is rarely dangerous and the pain often gets better over time.The cause of your back pain may not be known. Some common causes of back pain include:  Strain of the muscles or ligaments supporting the spine.  Wear and tear (degeneration) of the spinal disks.  Arthritis.  Direct injury to the back. For many people, back pain may return. Since back pain is rarely dangerous, most people can learn to manage this condition on their own. Follow these instructions at home: Watch your back pain for any changes. The following actions may help to lessen any discomfort you are feeling:  Remain active. It is stressful on your back to sit or stand in one place for long periods of time. Do not sit, drive, or stand in one place for more than 30 minutes at a time. Take short walks on even surfaces as soon as you are able.Try to increase the length of time you walk each day.  Exercise regularly as directed by your health care provider. Exercise helps your back heal faster. It also helps avoid future injury by keeping your muscles strong and flexible.  Do not stay in bed.Resting more than 1-2 days can delay your recovery.  Pay attention to your body when you bend and lift. The most comfortable positions are those that put less stress on your recovering back. Always use proper lifting techniques, including:  Bending your knees.  Keeping the load close to your body.  Avoiding twisting.  Find a comfortable position to sleep. Use a firm mattress and lie on your side with your knees slightly bent. If you lie on your back, put a pillow under your knees.  Avoid feeling anxious or stressed.Stress increases muscle tension and can worsen back pain.It is important to recognize when you are anxious or stressed and learn ways to manage it, such as with exercise.  Take medicines only as directed by your  health care provider. Over-the-counter medicines to reduce pain and inflammation are often the most helpful.Your health care provider may prescribe muscle relaxant drugs.These medicines help dull your pain so you can more quickly return to your normal activities and healthy exercise.  Apply ice to the injured area:  Put ice in a plastic bag.  Place a towel between your skin and the bag.  Leave the ice on for 20 minutes, 2-3 times a day for the first 2-3 days. After that, ice and heat may be alternated to reduce pain and spasms.  Maintain a healthy weight. Excess weight puts extra stress on your back and makes it difficult to maintain good posture. Contact a health care provider if:  You have pain that is not relieved with rest or medicine.  You have increasing pain going down into the legs or buttocks.  You have pain that does not improve in one week.  You have night pain.  You lose weight.  You have a fever or chills. Get help right away if:  You develop new bowel or bladder control problems.  You have unusual weakness or numbness in your arms or legs.  You develop nausea or vomiting.  You develop abdominal pain.  You feel faint. This information is not intended to replace advice given to you by your health care provider. Make sure you discuss any questions you have with your health care provider. Document Released: 07/17/2005 Document Revised: 11/25/2015 Document Reviewed: 11/18/2013 Elsevier Interactive  Patient Education  2017 Elsevier Inc.  

## 2016-09-15 NOTE — Progress Notes (Signed)
Patient ID: Sheryl [name], female   DOB: 1960/08/24, 56 y.o.   MRN: [id number]   Subjective:    Patient ID: Sheryl [name], female    DOB: May 03, 1961, 56 y.o.   MRN: [id number]  Chief Complaint  Patient presents with  . Follow-up  . Medication Refill  I acted as a Education administrator for Dr. Charlett Blake. Princess, RMA   HPI  Patient is in today for a medication follow up. She is doing well most days. She does use Alprazolam prn with good results. She feels the Sertraline helps well most days. No recent febrile illness or hospitalizaitons. She does struggle with some low back pain but denies any falls or trauma. No incontinence. Denies CP/palp/SOB/HA/congestion/fevers/GI or GU c/o. Taking meds as prescribed  Past Medical History:  Diagnosis Date  . Anemia   . Anxiety   . Anxiety and depression    Follows with Dr Gala Murdoch q 6 months Has taken Zoloft in past without good results   . Chicken pox as a child  . Depression   . Elevated BP 02/06/2012  . FH: colon cancer 06/07/2011  . GERD (gastroesophageal reflux disease)   . 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 Hyperlipidemia 05/08/2012  . Overweight(278.02)   . Preventative health care 06/07/2011  . Sacral pain 05/08/2012  . Tachycardia 11/10/2014  . Vitamin D deficiency 05/09/2014    Past Surgical History:  Procedure Laterality Date  . [place]   X 2  . COLONOSCOPY    . GUM SURGERY  2007  . right hand surgery  2009   infection debrided    Family History  Problem Relation Age of Onset  . Heart attack Mother   . Diabetes Mother     type 2  . Hypertension Mother   . Hyperlipidemia Mother   . Heart disease Mother   . Cancer Father     colon  . Heart attack Father   . Colon cancer Father   . Hypertension Sister   . Diabetes Sister     type 2  . Hypertension Sister   . Diabetes Maternal Grandmother   . Hypertension Maternal Grandmother   . Hyperlipidemia Maternal Grandmother     Social  History   Social History  . Marital status: Married    Spouse name: N/A  . Number of children: N/A  . Years of education: N/A   Occupational History  . Not on file.   Social History Main Topics  . Smoking status: Never Smoker  . Smokeless tobacco: Never Used  . Alcohol use 21.0 oz/week    14 Standard drinks or equivalent, 21 Glasses of wine per week     Comment: glass of wine nightly  . Drug use: No  . Sexual activity: Yes    Partners: Male     Comment: no dietary restrictions, lives with husband   Other Topics Concern  . Not on file   Social History Narrative  . No narrative on file    Outpatient Medications Prior to Visit  Medication Sig Dispense Refill  . Coenzyme Q10 (CO Q 10 PO) Take 1 capsule by mouth daily.    Astrid Drafts CAPS 1 cap daily MegaRed by Schiff    . metoprolol succinate (TOPROL-XL) 50 MG 24 hr tablet TAKE 1 TABLET BY MOUTH DAILY WITH OR IMMEDIATELY FOLLOWING A MEAL 90 tablet 1  . Multiple Vitamin (MULTIVITAMIN) capsule Take 1 capsule by mouth daily.      Marland Kitchen  pantoprazole (PROTONIX) 40 MG tablet TAKE 1 TABLET (40 MG TOTAL) BY MOUTH DAILY AS NEEDED. 90 tablet 1  . sertraline (ZOLOFT) 100 MG tablet TAKE 1.5 TABLETS (150 MG TOTAL) BY MOUTH DAILY. 135 tablet 1  . Vitamin D, Ergocalciferol, (DRISDOL) 50000 units CAPS capsule TAKE 1 CAPSULE (50,000 UNITS TOTAL) BY MOUTH ONCE A WEEK. 12 capsule 1  . ALPRAZolam (XANAX) 0.5 MG tablet TAKE 1/2-1 TAB BY MOUTH TWICE A DAY AS NEEDED FOR ANXIETY 10 tablet 0  . atorvastatin (LIPITOR) 10 MG tablet Take 0.5 tablets (5 mg total) by mouth every other day. (Patient not taking: Reported on 09/15/2016) 90 tablet 3  . sertraline (ZOLOFT) 100 MG tablet TAKE 1.5 TABLETS (150 MG TOTAL) BY MOUTH DAILY. 135 tablet 1   No facility-administered medications prior to visit.     Allergies  Allergen Reactions  . Penicillins Rash    Review of Systems  Constitutional: Negative for chills, fever and malaise/fatigue.  HENT: Negative for  congestion and hearing loss.   Eyes: Negative for discharge.  Respiratory: Negative for cough, sputum production and shortness of breath.   Cardiovascular: Positive for palpitations. Negative for chest pain and leg swelling.  Gastrointestinal: Negative for abdominal pain, blood in stool, constipation, diarrhea, heartburn, nausea and vomiting.  Genitourinary: Negative for dysuria, frequency, hematuria and urgency.  Musculoskeletal: Positive for back pain. Negative for falls, joint pain and myalgias.  Skin: Negative for rash.  Neurological: Negative for dizziness, sensory change, loss of consciousness, weakness and headaches.  Endo/Heme/Allergies: Negative for environmental allergies. Does not bruise/bleed easily.  Psychiatric/Behavioral: Negative for depression and suicidal ideas. The patient is not nervous/anxious and does not have insomnia.        Objective:    Physical Exam  Constitutional: She is oriented to person, place, and time. She appears well-developed and well-nourished. No distress.  HENT:  Head: Normocephalic and atraumatic.  Eyes: Conjunctivae are normal.  Neck: Neck supple. No thyromegaly present.  Cardiovascular: Normal rate, regular rhythm and normal heart sounds.   No murmur heard. Pulmonary/Chest: Effort normal and breath sounds normal. No respiratory distress.  Abdominal: Soft. Bowel sounds are normal. She exhibits no distension and no mass. There is no tenderness.  Musculoskeletal: She exhibits no edema.  Lymphadenopathy:    She has no cervical adenopathy.  Neurological: She is alert and oriented to person, place, and time.  Skin: Skin is warm and dry.  Psychiatric: She has a normal mood and affect. Her behavior is normal.    BP 126/82 (BP Location: Left Arm, Patient Position: Sitting, Cuff Size: Normal)   Pulse 76   Temp 98.4 F (36.9 C) (Oral)   Wt 162 lb 6.4 oz (73.7 kg)   LMP 09/08/2011   SpO2 97%   BMI 29.70 kg/m  Wt Readings from Last 3  Encounters:  09/15/16 162 lb 6.4 oz (73.7 kg)  10/11/15 163 lb 4 oz (74 kg)  04/12/15 162 lb (73.5 kg)     Lab Results  Component Value Date   WBC 5.0 09/15/2016   HGB 15.5 09/15/2016   HCT 46.3 (H) 09/15/2016   PLT 165 09/15/2016   GLUCOSE 97 09/15/2016   CHOL 248 (H) 09/15/2016   TRIG 111 09/15/2016   HDL 140 09/15/2016   LDLDIRECT 103.8 01/10/2013   LDLCALC 86 09/15/2016   ALT 30 (H) 09/15/2016   AST 39 (H) 09/15/2016   NA 137 09/15/2016   K 4.4 09/15/2016   CL 102 09/15/2016   CREATININE 1.09 (H) 09/15/2016  BUN 24 09/15/2016   CO2 22 09/15/2016   TSH 2.20 09/15/2016   HGBA1C 5.0 12/07/2014    Lab Results  Component Value Date   TSH 2.20 09/15/2016   Lab Results  Component Value Date   WBC 5.0 09/15/2016   HGB 15.5 09/15/2016   HCT 46.3 (H) 09/15/2016   MCV 88.9 09/15/2016   PLT 165 09/15/2016   Lab Results  Component Value Date   NA 137 09/15/2016   K 4.4 09/15/2016   CO2 22 09/15/2016   GLUCOSE 97 09/15/2016   BUN 24 09/15/2016   CREATININE 1.09 (H) 09/15/2016   BILITOT 0.7 09/15/2016   ALKPHOS 104 09/15/2016   AST 39 (H) 09/15/2016   ALT 30 (H) 09/15/2016   PROT 6.8 09/15/2016   ALBUMIN 4.2 09/15/2016   CALCIUM 11.0 (H) 09/15/2016   CALCIUM 11.0 (H) 09/15/2016   ANIONGAP 7 11/10/2014   GFR 74.51 10/11/2015   Lab Results  Component Value Date   CHOL 248 (H) 09/15/2016   Lab Results  Component Value Date   HDL 140 09/15/2016   Lab Results  Component Value Date   LDLCALC 86 09/15/2016   Lab Results  Component Value Date   TRIG 111 09/15/2016   Lab Results  Component Value Date   CHOLHDL 1.8 09/15/2016   Lab Results  Component Value Date   HGBA1C 5.0 12/07/2014       Assessment & Plan:   Problem List Items Addressed This Visit    GERD (gastroesophageal reflux disease)    Avoid offending foods, start probiotics. Do not eat large meals in late evening and consider raising head of bed. Doing well on Pantoprazole       Overweight    Encouraged DASH diet, decrease po intake and increase exercise as tolerated. Needs 7-8 hours of sleep nightly. Avoid trans fats, eat small, frequent meals every 4-5 hours with lean proteins, complex carbs and healthy fats. Minimize simple carbs, GMO foods.      Anemia    Increase leafy greens, consider increased lean red meat and using cast iron cookware. Continue to monitor, report any concerns      Relevant Orders   CBC (Completed)   Hyperlipidemia    Tolerating statin, encouraged heart healthy diet, avoid trans fats, minimize simple carbs and saturated fats. Increase exercise as tolerated      Relevant Orders   Lipid panel (Completed)   Abnormal liver function test   UTI symptoms   Relevant Orders   Urine culture (Completed)   Urinalysis (Completed)   Vitamin D deficiency - Primary    Encouraged daily Vitamin D supplements 1000 to 2000 IU daily      Relevant Orders   VITAMIN D 25 Hydroxy (Vit-D Deficiency, Fractures) (Completed)   Tachycardia    RRR today      Palpitation   Relevant Orders   Comprehensive metabolic panel (Completed)   TSH (Completed)   High serum parathyroid hormone (PTH)    Asymptomatic, stable. Will continue to monitor      Relevant Orders   PTH, Intact and Calcium (Completed)    Other Visit Diagnoses    Adjustment disorder with mixed anxiety and depressed mood       Relevant Medications   ALPRAZolam (XANAX) 0.5 MG tablet      I have discontinued Ms. Barcelona's atorvastatin. I am also having her maintain her multivitamin, Krill Oil, Coenzyme Q10 (CO Q 10 PO), sertraline, Vitamin D (Ergocalciferol), metoprolol succinate, pantoprazole, and ALPRAZolam.  Meds ordered this encounter  Medications  . ALPRAZolam (XANAX) 0.5 MG tablet    Sig: TAKE 1/2-1 TAB BY MOUTH TWICE A DAY AS NEEDED FOR ANXIETY    Dispense:  60 tablet    Refill:  0    CMA served as scribe during this visit. History, Physical and Plan performed by medical  provider. Documentation and orders reviewed and attested to.  Penni Homans, MD

## 2016-09-15 NOTE — Assessment & Plan Note (Signed)
Encouraged DASH diet, decrease po intake and increase exercise as tolerated. Needs 7-8 hours of sleep nightly. Avoid trans fats, eat small, frequent meals every 4-5 hours with lean proteins, complex carbs and healthy fats. Minimize simple carbs, GMO foods. 

## 2016-09-15 NOTE — Progress Notes (Signed)
Pre visit review using our clinic review tool, if applicable. No additional management support is needed unless otherwise documented below in the visit note. 

## 2016-09-16 LAB — LIPID PANEL
Cholesterol: 248 mg/dL — ABNORMAL HIGH (ref <200)
HDL: 140 mg/dL (ref >50)
LDL Cholesterol: 86 mg/dL (ref <100)
Total CHOL/HDL Ratio: 1.8 Ratio (ref <5.0)
Triglycerides: 111 mg/dL (ref <150)
VLDL: 22 mg/dL (ref <30)

## 2016-09-16 LAB — COMPREHENSIVE METABOLIC PANEL
ALT: 30 U/L — ABNORMAL HIGH (ref 6–29)
AST: 39 U/L — ABNORMAL HIGH (ref 10–35)
Albumin: 4.2 g/dL (ref 3.6–5.1)
Alkaline Phosphatase: 104 U/L (ref 33–130)
BUN: 24 mg/dL (ref 7–25)
CHLORIDE: 102 mmol/L (ref 98–110)
CO2: 22 mmol/L (ref 20–31)
CREATININE: 1.09 mg/dL — AB (ref 0.50–1.05)
Calcium: 11 mg/dL — ABNORMAL HIGH (ref 8.6–10.4)
Glucose, Bld: 97 mg/dL (ref 65–99)
Potassium: 4.4 mmol/L (ref 3.5–5.3)
SODIUM: 137 mmol/L (ref 135–146)
Total Bilirubin: 0.7 mg/dL (ref 0.2–1.2)
Total Protein: 6.8 g/dL (ref 6.1–8.1)

## 2016-09-16 LAB — VITAMIN D 25 HYDROXY (VIT D DEFICIENCY, FRACTURES): VIT D 25 HYDROXY: 26 ng/mL — AB (ref 30–100)

## 2016-09-17 LAB — URINE CULTURE

## 2016-09-18 ENCOUNTER — Encounter: Payer: Self-pay | Admitting: Family Medicine

## 2016-09-18 LAB — PTH, INTACT AND CALCIUM
CALCIUM: 11 mg/dL — AB (ref 8.6–10.4)
PTH: 134 pg/mL — AB (ref 14–64)

## 2016-09-18 NOTE — Assessment & Plan Note (Signed)
Tolerating statin, encouraged heart healthy diet, avoid trans fats, minimize simple carbs and saturated fats. Increase exercise as tolerated 

## 2016-09-18 NOTE — Assessment & Plan Note (Signed)
Asymptomatic, stable. Will continue to monitor

## 2016-09-18 NOTE — Assessment & Plan Note (Signed)
Increase leafy greens, consider increased lean red meat and using cast iron cookware. Continue to monitor, report any concerns 

## 2016-09-18 NOTE — Assessment & Plan Note (Signed)
Avoid offending foods, start probiotics. Do not eat large meals in late evening and consider raising head of bed. Doing well on Pantoprazole 

## 2016-09-18 NOTE — Assessment & Plan Note (Signed)
RRR today 

## 2016-09-18 NOTE — Assessment & Plan Note (Signed)
Encouraged daily Vitamin D supplements 1000 to 2000 IU daily

## 2016-09-19 ENCOUNTER — Encounter: Payer: Self-pay | Admitting: Family Medicine

## 2016-09-19 DIAGNOSIS — Z79899 Other long term (current) drug therapy: Secondary | ICD-10-CM | POA: Diagnosis not present

## 2016-09-19 LAB — URINALYSIS

## 2016-11-28 ENCOUNTER — Other Ambulatory Visit: Payer: Self-pay | Admitting: Family Medicine

## 2016-11-28 DIAGNOSIS — F4323 Adjustment disorder with mixed anxiety and depressed mood: Secondary | ICD-10-CM

## 2016-11-30 MED ORDER — ALPRAZOLAM 0.5 MG PO TABS: ORAL_TABLET | ORAL | 0 refills | Status: DC

## 2016-11-30 NOTE — Telephone Encounter (Signed)
Faxed hardcopy for Alprazolam to [facility] [address] [place]

## 2016-11-30 NOTE — Telephone Encounter (Signed)
Requesting:   alprazolam Contract     09/15/2016 UDS   Low risk next due on 03/15/2017 Last OV   09/15/2016 Last Refill    #[phone] with 0 refills on 09/15/2016  Please Advise

## 2016-11-30 NOTE — Addendum Note (Signed)
Addended by: Sharon Seller B on: 11/30/2016 01:25 PM   Modules accepted: Orders

## 2016-12-05 ENCOUNTER — Other Ambulatory Visit: Payer: Self-pay | Admitting: Family Medicine

## 2016-12-29 ENCOUNTER — Encounter: Payer: Commercial Managed Care - HMO | Admitting: Family Medicine

## 2017-01-11 ENCOUNTER — Other Ambulatory Visit: Payer: Self-pay | Admitting: Family Medicine

## 2017-01-18 ENCOUNTER — Ambulatory Visit (INDEPENDENT_AMBULATORY_CARE_PROVIDER_SITE_OTHER): Payer: 59 | Admitting: Family Medicine

## 2017-01-18 ENCOUNTER — Encounter: Payer: Self-pay | Admitting: Family Medicine

## 2017-01-18 DIAGNOSIS — J0100 Acute maxillary sinusitis, unspecified: Secondary | ICD-10-CM | POA: Diagnosis not present

## 2017-01-18 DIAGNOSIS — R000 Tachycardia, unspecified: Secondary | ICD-10-CM

## 2017-01-18 MED ORDER — SULFAMETHOXAZOLE-TRIMETHOPRIM 800-160 MG PO TABS: 1.0000 | ORAL_TABLET | Freq: Two times a day (BID) | ORAL | 0 refills | Status: DC

## 2017-01-18 MED ORDER — HYDROCODONE-HOMATROPINE 5-1.5 MG/5ML PO SYRP: 5.0000 mL | ORAL_SOLUTION | Freq: Three times a day (TID) | ORAL | 0 refills | Status: DC | PRN

## 2017-01-18 MED FILL — SULFAMETHOXAZOLE/TMP DS TAB: 800-160 | 10 days supply | Qty: 20 | Fill #0

## 2017-01-18 MED FILL — HYDROCODONE-HOMATROPINE SYR: 5-1.5 | 8 days supply | Qty: 120 | Fill #0

## 2017-01-18 NOTE — Progress Notes (Signed)
Subjective:  I acted as a Education administrator for Dr. Charlett Blake. Princess, [place]  Patient ID: Sheryl [name], female    DOB: 1961-04-11, 56 y.o.   MRN: [id number]  No chief complaint on file.   HPI  Patient is in today for an acute visit. Patient s/o head congestion, sore throat, fevers, aches and chills. Patient also endorses pnd, cough productive of green phlegm, malaise and myalgias with 1 day of nausea and vomitint at onset. She is being kept up at night by cough. Her daughter and son in law have had similar symptoms since their kids were sick. She has tried Mucinex Max but it made her feesl funny. Does not want to take it any more. She denies any SOB, chest congestion, CP, palpitations.   Patient Care Team: Mosie Lukes, MD as PCP - General (Family Medicine)   Past Medical History:  Diagnosis Date  . Anemia   . Anxiety   . Anxiety and depression    Follows with Dr Gala Murdoch q 6 months Has taken Zoloft in past without good results   . Chicken pox as a child  . Depression   . Elevated BP 02/06/2012  . FH: colon cancer 06/07/2011  . GERD (gastroesophageal reflux disease)   . 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 Hyperlipidemia 05/08/2012  . Overweight(278.02)   . Preventative health care 06/07/2011  . Sacral pain 05/08/2012  . Tachycardia 11/10/2014  . Vitamin D deficiency 05/09/2014    Past Surgical History:  Procedure Laterality Date  . [place]   X 2  . COLONOSCOPY    . GUM SURGERY  2007  . right hand surgery  2009   infection debrided    Family History  Problem Relation Age of Onset  . Heart attack Mother   . Diabetes Mother        type 2  . Hypertension Mother   . Hyperlipidemia Mother   . Heart disease Mother   . Cancer Father        colon  . Heart attack Father   . Colon cancer Father   . Hypertension Sister   . Diabetes Sister        type 2  . Hypertension Sister   . Diabetes Maternal Grandmother   . Hypertension Maternal  Grandmother   . Hyperlipidemia Maternal Grandmother     Social History   Social History  . Marital status: Married    Spouse name: N/A  . Number of children: N/A  . Years of education: N/A   Occupational History  . Not on file.   Social History Main Topics  . Smoking status: Never Smoker  . Smokeless tobacco: Never Used  . Alcohol use 21.0 oz/week    14 Standard drinks or equivalent, 21 Glasses of wine per week     Comment: glass of wine nightly  . Drug use: No  . Sexual activity: Yes    Partners: Male     Comment: no dietary restrictions, lives with husband   Other Topics Concern  . Not on file   Social History Narrative  . No narrative on file    Outpatient Medications Prior to Visit  Medication Sig Dispense Refill  . ALPRAZolam (XANAX) 0.5 MG tablet Take 1/2 to 1 tablet by mouth twice a day as needed for anxiety 60 tablet 0  . Coenzyme Q10 (CO Q 10 PO) Take 1 capsule by mouth daily.    Javier Docker  Oil CAPS 1 cap daily MegaRed by Schiff    . metoprolol succinate (TOPROL-XL) 50 MG 24 hr tablet TAKE 1 TABLET BY MOUTH DAILY WITH OR IMMEDIATELY FOLLOWING A MEAL 90 tablet 1  . Multiple Vitamin (MULTIVITAMIN) capsule Take 1 capsule by mouth daily.      . pantoprazole (PROTONIX) 40 MG tablet TAKE 1 TABLET (40 MG TOTAL) BY MOUTH DAILY AS NEEDED. 90 tablet 1  . sertraline (ZOLOFT) 100 MG tablet TAKE 1.5 TABLETS (150 MG TOTAL) BY MOUTH DAILY. 135 tablet 1  . Vitamin D, Ergocalciferol, (DRISDOL) 50000 units CAPS capsule TAKE 1 CAPSULE (50,000 UNITS TOTAL) BY MOUTH ONCE A WEEK. 12 capsule 1   No facility-administered medications prior to visit.     Allergies  Allergen Reactions  . Penicillins Rash    Review of Systems  Constitutional: Positive for chills, fever and malaise/fatigue.  HENT: Positive for congestion, ear pain, sinus pain and sore throat. Negative for ear discharge and nosebleeds.   Eyes: Negative for blurred vision.  Respiratory: Negative for cough and  shortness of breath.   Cardiovascular: Negative for chest pain, palpitations and leg swelling.  Gastrointestinal: Positive for nausea and vomiting. Negative for abdominal pain and diarrhea.  Musculoskeletal: Positive for myalgias. Negative for back pain.  Skin: Negative for rash.  Neurological: Positive for headaches. Negative for loss of consciousness.       Objective:    Physical Exam  Constitutional: She is oriented to person, place, and time. She appears well-developed and well-nourished. No distress.  HENT:  Head: Normocephalic and atraumatic.  Nasal mucosa boggy and erythematous.   Eyes: Conjunctivae are normal.  Neck: Normal range of motion. No tracheal deviation present. No thyromegaly present.  Cardiovascular: Normal rate and regular rhythm.   Pulmonary/Chest: Effort normal and breath sounds normal. She has no wheezes.  Abdominal: Soft. Bowel sounds are normal. There is no tenderness.  Musculoskeletal: Normal range of motion. She exhibits no edema or deformity.  Lymphadenopathy:    She has cervical adenopathy.  Neurological: She is alert and oriented to person, place, and time.  Skin: Skin is warm and dry. She is not diaphoretic.  Psychiatric: She has a normal mood and affect.    BP 98/78 (BP Location: Left Arm, Patient Position: Sitting, Cuff Size: Normal)   Pulse 92   Temp 98.3 F (36.8 C) (Oral)   Resp 18   Wt 160 lb (72.6 kg)   LMP 09/08/2011   SpO2 97%   BMI 29.26 kg/m  Wt Readings from Last 3 Encounters:  01/18/17 160 lb (72.6 kg)  09/15/16 162 lb 6.4 oz (73.7 kg)  10/11/15 163 lb 4 oz (74 kg)   BP Readings from Last 3 Encounters:  01/18/17 98/78  09/15/16 126/82  10/11/15 110/80     Immunization History  Administered Date(s) Administered  . Influenza,inj,Quad PF,36+ Mos 05/08/2014, 04/12/2015  . Influenza-Unspecified 05/08/2013, 05/12/2016  . Tdap 06/07/2011    Health Maintenance  Topic Date Due  . Hepatitis C Screening  08[birth year]  . HIV  Screening  04/15/1978  . INFLUENZA VACCINE  02/28/2017  . MAMMOGRAM  10/10/2017  . PAP SMEAR  04/11/2018  . TETANUS/TDAP  06/06/2021  . COLONOSCOPY  07/10/2021    Lab Results  Component Value Date   WBC 5.0 09/15/2016   HGB 15.5 09/15/2016   HCT 46.3 (H) 09/15/2016   PLT 165 09/15/2016   GLUCOSE 97 09/15/2016   CHOL 248 (H) 09/15/2016   TRIG 111 09/15/2016   HDL 140  09/15/2016   LDLDIRECT 103.8 01/10/2013   LDLCALC 86 09/15/2016   ALT 30 (H) 09/15/2016   AST 39 (H) 09/15/2016   NA 137 09/15/2016   K 4.4 09/15/2016   CL 102 09/15/2016   CREATININE 1.09 (H) 09/15/2016   BUN 24 09/15/2016   CO2 22 09/15/2016   TSH 2.20 09/15/2016   HGBA1C 5.0 12/07/2014    Lab Results  Component Value Date   TSH 2.20 09/15/2016   Lab Results  Component Value Date   WBC 5.0 09/15/2016   HGB 15.5 09/15/2016   HCT 46.3 (H) 09/15/2016   MCV 88.9 09/15/2016   PLT 165 09/15/2016   Lab Results  Component Value Date   NA 137 09/15/2016   K 4.4 09/15/2016   CO2 22 09/15/2016   GLUCOSE 97 09/15/2016   BUN 24 09/15/2016   CREATININE 1.09 (H) 09/15/2016   BILITOT 0.7 09/15/2016   ALKPHOS 104 09/15/2016   AST 39 (H) 09/15/2016   ALT 30 (H) 09/15/2016   PROT 6.8 09/15/2016   ALBUMIN 4.2 09/15/2016   CALCIUM 11.0 (H) 09/15/2016   CALCIUM 11.0 (H) 09/15/2016   ANIONGAP 7 11/10/2014   GFR 74.51 10/11/2015   Lab Results  Component Value Date   CHOL 248 (H) 09/15/2016   Lab Results  Component Value Date   HDL 140 09/15/2016   Lab Results  Component Value Date   LDLCALC 86 09/15/2016   Lab Results  Component Value Date   TRIG 111 09/15/2016   Lab Results  Component Value Date   CHOLHDL 1.8 09/15/2016   Lab Results  Component Value Date   HGBA1C 5.0 12/07/2014         Assessment & Plan:   Problem List Items Addressed This Visit    Sinusitis, acute    Patient with Fevers, chills, congestion, pnd, cough, green phlegm, malaise and myalgias with 1 day of nausea and  vomiting at onset. Viral vs Bacterial discussed at length with patient and she is Encouraged increased rest and hydration, add probiotics such as the [place] or [employer], zinc such as Coldeze or Xicam. Treat fevers as needed, 500 to 1000 mg of Vitamin C, Aged or Black garlic tabs daily, Elderberry liquid or tabs, Umcka daily, Mucinex/Guaifenasin twice daily Sudafed/Pseudoephedrine avoid this since it made her feel strange. If none of this works then she can take the Bactrim she was given and she also has a prescription for hydromet to use qhs since the cough is keeping her up      Relevant Medications   HYDROcodone-homatropine (HYCODAN) 5-1.5 MG/5ML syrup   sulfamethoxazole-trimethoprim (BACTRIM DS,SEPTRA DS) 800-160 MG tablet   Tachycardia    Improved with recheck         I am having Ms. Haberland start on HYDROcodone-homatropine and sulfamethoxazole-trimethoprim. I am also having her maintain her multivitamin, Krill Oil, Coenzyme Q10 (CO Q 10 PO), sertraline, ALPRAZolam, Vitamin D (Ergocalciferol), metoprolol succinate, and pantoprazole.  Meds ordered this encounter  Medications  . HYDROcodone-homatropine (HYCODAN) 5-1.5 MG/5ML syrup    Sig: Take 5 mLs by mouth every 8 (eight) hours as needed for cough.    Dispense:  120 mL    Refill:  0  . sulfamethoxazole-trimethoprim (BACTRIM DS,SEPTRA DS) 800-160 MG tablet    Sig: Take 1 tablet by mouth 2 (two) times daily.    Dispense:  20 tablet    Refill:  0    CMA served as scribe during this visit. History, Physical and  Plan performed by medical provider. Documentation and orders reviewed and attested to.  Penni Homans, MD

## 2017-01-18 NOTE — Assessment & Plan Note (Addendum)
Patient with Fevers, chills, congestion, pnd, cough, green phlegm, malaise and myalgias with 1 day of nausea and vomiting at onset. Viral vs Bacterial discussed at length with patient and she is Encouraged increased rest and hydration, add probiotics such as the [place] or [employer], zinc such as Coldeze or Xicam. Treat fevers as needed, 500 to 1000 mg of Vitamin C, Aged or Black garlic tabs daily, Elderberry liquid or tabs, Umcka daily, Mucinex/Guaifenasin twice daily Sudafed/Pseudoephedrine avoid this since it made her feel strange. If none of this works then she can take the Bactrim she was given and she also has a prescription for hydromet to use qhs since the cough is keeping her up

## 2017-01-18 NOTE — Patient Instructions (Signed)
Encouraged increased rest and hydration, add probiotics such as the [place] or [employer], zinc such as Coldeze or Xicam. Treat fevers as needed, 500 to 1000 mg of Vitamin C, Aged or Black garlic tabs daily, Elderberry liquid or tabs, Umcka daily, Mucinex/Guaifenasin twice daily Sudafed/Pseudoephedrine avoid this  Sinusitis, Adult Sinusitis is soreness and inflammation of your sinuses. Sinuses are hollow spaces in the bones around your face. They are located:  Around your eyes.  In the middle of your forehead.  Behind your nose.  In your cheekbones.  Your sinuses and nasal passages are lined with a stringy fluid (mucus). Mucus normally drains out of your sinuses. When your nasal tissues get inflamed or swollen, the mucus can get trapped or blocked so air cannot flow through your sinuses. This lets bacteria, viruses, and funguses grow, and that leads to infection. Follow these instructions at home: Medicines  Take, use, or apply over-the-counter and prescription medicines only as told by your doctor. These may include nasal sprays.  If you were prescribed an antibiotic medicine, take it as told by your doctor. Do not stop taking the antibiotic even if you start to feel better. Hydrate and Humidify  Drink enough water to keep your pee (urine) clear or pale yellow.  Use a cool mist humidifier to keep the humidity level in your home above 50%.  Breathe in steam for 10-15 minutes, 3-4 times a day or as told by your doctor. You can do this in the bathroom while a hot shower is running.  Try not to spend time in cool or dry air. Rest  Rest as much as possible.  Sleep with your head raised (elevated).  Make sure to get enough sleep each night. General instructions  Put a warm, moist washcloth on your face 3-4 times a day or as told by your doctor. This will help with discomfort.  Wash your hands often with soap and water. If there is no soap and water, use hand  sanitizer.  Do not smoke. Avoid being around people who are smoking (secondhand smoke).  Keep all follow-up visits as told by your doctor. This is important. Contact a doctor if:  You have a fever.  Your symptoms get worse.  Your symptoms do not get better within 10 days. Get help right away if:  You have a very bad headache.  You cannot stop throwing up (vomiting).  You have pain or swelling around your face or eyes.  You have trouble seeing.  You feel confused.  Your neck is stiff.  You have trouble breathing. This information is not intended to replace advice given to you by your health care provider. Make sure you discuss any questions you have with your health care provider. Document Released: 01/03/2008 Document Revised: 03/12/2016 Document Reviewed: 05/12/2015 Elsevier Interactive Patient Education  [employer].

## 2017-01-18 NOTE — Assessment & Plan Note (Signed)
Improved with recheck 

## 2017-02-13 ENCOUNTER — Other Ambulatory Visit: Payer: Self-pay | Admitting: Family Medicine

## 2017-02-27 ENCOUNTER — Encounter (HOSPITAL_BASED_OUTPATIENT_CLINIC_OR_DEPARTMENT_OTHER): Payer: Self-pay

## 2017-02-27 ENCOUNTER — Encounter: Payer: Self-pay | Admitting: Family Medicine

## 2017-02-27 ENCOUNTER — Ambulatory Visit (HOSPITAL_BASED_OUTPATIENT_CLINIC_OR_DEPARTMENT_OTHER)
Admission: RE | Admit: 2017-02-27 | Discharge: 2017-02-27 | Disposition: A | Discharge status: Home / Self Care | Payer: 59 | Source: Ambulatory Visit | Attending: Family Medicine | Admitting: Family Medicine

## 2017-02-27 ENCOUNTER — Ambulatory Visit (INDEPENDENT_AMBULATORY_CARE_PROVIDER_SITE_OTHER): Payer: 59 | Admitting: Family Medicine

## 2017-02-27 DIAGNOSIS — M8580 Other specified disorders of bone density and structure, unspecified site: Secondary | ICD-10-CM | POA: Insufficient documentation

## 2017-02-27 DIAGNOSIS — Z1231 Encounter for screening mammogram for malignant neoplasm of breast: Secondary | ICD-10-CM | POA: Diagnosis not present

## 2017-02-27 DIAGNOSIS — K635 Polyp of colon: Secondary | ICD-10-CM

## 2017-02-27 DIAGNOSIS — M8589 Other specified disorders of bone density and structure, multiple sites: Secondary | ICD-10-CM | POA: Diagnosis not present

## 2017-02-27 DIAGNOSIS — E782 Mixed hyperlipidemia: Secondary | ICD-10-CM | POA: Diagnosis not present

## 2017-02-27 DIAGNOSIS — E663 Overweight: Secondary | ICD-10-CM

## 2017-02-27 DIAGNOSIS — Z0001 Encounter for general adult medical examination with abnormal findings: Secondary | ICD-10-CM

## 2017-02-27 DIAGNOSIS — Z780 Asymptomatic menopausal state: Secondary | ICD-10-CM | POA: Diagnosis not present

## 2017-02-27 DIAGNOSIS — R7989 Other specified abnormal findings of blood chemistry: Secondary | ICD-10-CM

## 2017-02-27 DIAGNOSIS — N649 Disorder of breast, unspecified: Secondary | ICD-10-CM | POA: Diagnosis not present

## 2017-02-27 DIAGNOSIS — E559 Vitamin D deficiency, unspecified: Secondary | ICD-10-CM

## 2017-02-27 DIAGNOSIS — L988 Other specified disorders of the skin and subcutaneous tissue: Secondary | ICD-10-CM | POA: Insufficient documentation

## 2017-02-27 DIAGNOSIS — Z0000 Encounter for general adult medical examination without abnormal findings: Secondary | ICD-10-CM

## 2017-02-27 DIAGNOSIS — Z1239 Encounter for other screening for malignant neoplasm of breast: Secondary | ICD-10-CM

## 2017-02-27 DIAGNOSIS — F329 Major depressive disorder, single episode, unspecified: Secondary | ICD-10-CM

## 2017-02-27 DIAGNOSIS — K219 Gastro-esophageal reflux disease without esophagitis: Secondary | ICD-10-CM

## 2017-02-27 DIAGNOSIS — F419 Anxiety disorder, unspecified: Secondary | ICD-10-CM

## 2017-02-27 DIAGNOSIS — F32A Depression, unspecified: Secondary | ICD-10-CM

## 2017-02-27 DIAGNOSIS — M8588 Other specified disorders of bone density and structure, other site: Secondary | ICD-10-CM | POA: Diagnosis not present

## 2017-02-27 DIAGNOSIS — F4323 Adjustment disorder with mixed anxiety and depressed mood: Secondary | ICD-10-CM

## 2017-02-27 HISTORY — DX: Other specified disorders of bone density and structure, unspecified site: M85.80

## 2017-02-27 LAB — CBC
HEMATOCRIT: 44.5 % (ref 36.0–46.0)
HEMOGLOBIN: 14.8 g/dL (ref 12.0–15.0)
MCHC: 33.2 g/dL (ref 30.0–36.0)
MCV: 93.7 fl (ref 78.0–100.0)
Platelets: 166 10*3/uL (ref 150.0–400.0)
RBC: 4.75 Mil/uL (ref 3.87–5.11)
RDW: 14.8 % (ref 11.5–15.5)
WBC: 3.9 10*3/uL — AB (ref 4.0–10.5)

## 2017-02-27 LAB — COMPREHENSIVE METABOLIC PANEL
ALBUMIN: 4 g/dL (ref 3.5–5.2)
ALK PHOS: 103 U/L (ref 39–117)
ALT: 24 U/L (ref 0–35)
AST: 31 U/L (ref 0–37)
BILIRUBIN TOTAL: 0.5 mg/dL (ref 0.2–1.2)
BUN: 21 mg/dL (ref 6–23)
CO2: 27 mEq/L (ref 19–32)
Calcium: 10.5 mg/dL (ref 8.4–10.5)
Chloride: 108 mEq/L (ref 96–112)
Creatinine, Ser: 0.96 mg/dL (ref 0.40–1.20)
GFR: 64.4 mL/min (ref >60.00)
Glucose, Bld: 75 mg/dL (ref 70–99)
POTASSIUM: 4.6 meq/L (ref 3.5–5.1)
SODIUM: 140 meq/L (ref 135–145)
TOTAL PROTEIN: 6.6 g/dL (ref 6.0–8.3)

## 2017-02-27 LAB — LIPID PANEL
CHOLESTEROL: 245 mg/dL — AB (ref 0–200)
HDL: 114.8 mg/dL (ref >39.00)
LDL Cholesterol: 110 mg/dL — ABNORMAL HIGH (ref 0–99)
NonHDL: 130.03
Total CHOL/HDL Ratio: 2
Triglycerides: 99 mg/dL (ref 0.0–149.0)
VLDL: 19.8 mg/dL (ref 0.0–40.0)

## 2017-02-27 LAB — TSH: TSH: 3.3 u[IU]/mL (ref 0.35–4.50)

## 2017-02-27 MED ORDER — ALPRAZOLAM 0.5 MG PO TABS: ORAL_TABLET | ORAL | 3 refills | Status: DC

## 2017-02-27 NOTE — Assessment & Plan Note (Signed)
Encouraged heart healthy diet, increase exercise, avoid trans fats, consider a krill oil cap daily 

## 2017-02-27 NOTE — Assessment & Plan Note (Signed)
Due to family history she is on a 5 year recall for colonoscopy and she is due so referral is placed

## 2017-02-27 NOTE — Assessment & Plan Note (Signed)
Repeat Dexa scan. Encouraged to get adequate exercise and vitamin d intake

## 2017-02-27 NOTE — Assessment & Plan Note (Signed)
Encouraged DASH diet, decrease po intake and increase exercise as tolerated. Needs 7-8 hours of sleep nightly. Avoid trans fats, eat small, frequent meals every 4-5 hours with lean proteins, complex carbs and healthy fats. Minimize simple carbs, 

## 2017-02-27 NOTE — Assessment & Plan Note (Signed)
Lesion under right breast present for months but now is bleeding at times, will refer to dermatology for further consideration

## 2017-02-27 NOTE — Assessment & Plan Note (Signed)
Check level 

## 2017-02-27 NOTE — Progress Notes (Signed)
Subjective:  I acted as a Education administrator for Dr. Charlett Blake. [place], [place]  Patient ID: Sheryl [name], female    DOB: 1961/05/03, 56 y.o.   MRN: [id number]  No chief complaint on file.   HPI  Patient is in today for an annual exam. She feels well today. No recent febrile illness or hospitalizations. She is noting an irritated lesion under her right breast. She has had it for at least months but lately she has noted a little bit of blood where it catches on her bra. No severe pain or enlargement. She otherwise feels well. She does feel the sertraline and alprazolam are helping her as needed and she uses the alprazolam very infrequently at this time. She is trying to stay active and walking. Tries to maintain a heart healthy diet. Denies CP/palp/SOB/HA/congestion/fevers/GI or GU c/o. Taking meds as prescribed  Patient Care Team: Mosie Lukes, MD as PCP - General (Family Medicine)   Past Medical History:  Diagnosis Date  . Anemia   . Anxiety   . Anxiety and depression    Follows with Dr Gala Murdoch q 6 months Has taken Zoloft in past without good results   . Chicken pox as a child  . Depression   . Elevated BP 02/06/2012  . FH: colon cancer 06/07/2011  . GERD (gastroesophageal reflux disease)   . 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 Hyperlipidemia 05/08/2012  . Osteopenia 02/27/2017  . Overweight(278.02)   . Preventative health care 06/07/2011  . Sacral pain 05/08/2012  . Tachycardia 11/10/2014  . Vitamin D deficiency 05/09/2014    Past Surgical History:  Procedure Laterality Date  . [place]   X 2  . COLONOSCOPY    . GUM SURGERY  2007  . right hand surgery  2009   infection debrided    Family History  Problem Relation Age of Onset  . Heart attack Mother   . Diabetes Mother        type 2  . Hypertension Mother   . Hyperlipidemia Mother   . Heart disease Mother   . Cancer Father        colon  . Heart attack Father   . Colon cancer Father    . Hypertension Sister   . Diabetes Sister        type 2  . Hypertension Sister   . Diabetes Maternal Grandmother   . Hypertension Maternal Grandmother   . Hyperlipidemia Maternal Grandmother     Social History   Social History  . Marital status: Married    Spouse name: N/A  . Number of children: N/A  . Years of education: N/A   Occupational History  . Not on file.   Social History Main Topics  . Smoking status: Never Smoker  . Smokeless tobacco: Never Used  . Alcohol use 21.0 oz/week    21 Glasses of wine, 14 Standard drinks or equivalent per week     Comment: glass of wine nightly  . Drug use: No  . Sexual activity: Yes    Partners: Male     Comment: no dietary restrictions, lives with husband   Other Topics Concern  . Not on file   Social History Narrative  . No narrative on file    Outpatient Medications Prior to Visit  Medication Sig Dispense Refill  . Coenzyme Q10 (CO Q 10 PO) Take 1 capsule by mouth daily.    Astrid Drafts CAPS 1 cap  daily MegaRed by Schiff    . metoprolol succinate (TOPROL-XL) 50 MG 24 hr tablet TAKE 1 TABLET BY MOUTH DAILY WITH OR IMMEDIATELY FOLLOWING A MEAL 90 tablet 1  . Multiple Vitamin (MULTIVITAMIN) capsule Take 1 capsule by mouth daily.      . pantoprazole (PROTONIX) 40 MG tablet TAKE 1 TABLET (40 MG TOTAL) BY MOUTH DAILY AS NEEDED. 90 tablet 1  . sertraline (ZOLOFT) 100 MG tablet TAKE 1.5 TABLETS (150 MG TOTAL) BY MOUTH DAILY. 135 tablet 1  . Vitamin D, Ergocalciferol, (DRISDOL) 50000 units CAPS capsule TAKE 1 CAPSULE (50,000 UNITS TOTAL) BY MOUTH ONCE A WEEK. 12 capsule 1  . ALPRAZolam (XANAX) 0.5 MG tablet Take 1/2 to 1 tablet by mouth twice a day as needed for anxiety 60 tablet 0  . HYDROcodone-homatropine (HYCODAN) 5-1.5 MG/5ML syrup Take 5 mLs by mouth every 8 (eight) hours as needed for cough. 120 mL 0  . sulfamethoxazole-trimethoprim (BACTRIM DS,SEPTRA DS) 800-160 MG tablet Take 1 tablet by mouth 2 (two) times daily. 20 tablet  0   No facility-administered medications prior to visit.     Allergies  Allergen Reactions  . Penicillins Rash    Review of Systems  Constitutional: Negative for chills, fever and malaise/fatigue.  HENT: Positive for congestion. Negative for hearing loss.   Eyes: Negative for discharge.  Respiratory: Negative for cough, sputum production and shortness of breath.   Cardiovascular: Negative for chest pain, palpitations and leg swelling.  Gastrointestinal: Negative for abdominal pain, blood in stool, constipation, diarrhea, heartburn, nausea and vomiting.  Genitourinary: Negative for dysuria, frequency, hematuria and urgency.  Musculoskeletal: Negative for back pain, falls and myalgias.  Skin: Negative for rash.       Bleeding lesion under right breast.   Neurological: Negative for dizziness, sensory change, loss of consciousness, weakness and headaches.  Endo/Heme/Allergies: Negative for environmental allergies. Does not bruise/bleed easily.  Psychiatric/Behavioral: Negative for depression and suicidal ideas. The patient is not nervous/anxious and does not have insomnia.        Objective:    Physical Exam  Constitutional: She is oriented to person, place, and time. She appears well-developed and well-nourished. No distress.  HENT:  Head: Normocephalic and atraumatic.  Eyes: Conjunctivae are normal.  Neck: Neck supple. No thyromegaly present.  Cardiovascular: Normal rate, regular rhythm and normal heart sounds.   No murmur heard. Pulmonary/Chest: Effort normal and breath sounds normal. No respiratory distress.  Abdominal: Soft. Bowel sounds are normal. She exhibits no distension and no mass. There is no tenderness.  Musculoskeletal: She exhibits no edema.  Lymphadenopathy:    She has no cervical adenopathy.  Neurological: She is alert and oriented to person, place, and time.  Skin: Skin is warm and dry.  1 cm violaceous lesion under right breast, mild erythema at base    Psychiatric: She has a normal mood and affect. Her behavior is normal.    LMP 09/08/2011  Wt Readings from Last 3 Encounters:  01/18/17 160 lb (72.6 kg)  09/15/16 162 lb 6.4 oz (73.7 kg)  10/11/15 163 lb 4 oz (74 kg)   BP Readings from Last 3 Encounters:  01/18/17 98/78  09/15/16 126/82  10/11/15 110/80     Immunization History  Administered Date(s) Administered  . Influenza,inj,Quad PF,36+ Mos 05/08/2014, 04/12/2015  . Influenza-Unspecified 05/08/2013, 05/12/2016  . Tdap 06/07/2011    Health Maintenance  Topic Date Due  . Hepatitis C Screening  03/[birth year]  . HIV Screening  04/15/1978  . INFLUENZA VACCINE  02/28/2017  . MAMMOGRAM  10/10/2017  . PAP SMEAR  04/11/2018  . TETANUS/TDAP  06/06/2021  . COLONOSCOPY  07/10/2021    Lab Results  Component Value Date   WBC 5.0 09/15/2016   HGB 15.5 09/15/2016   HCT 46.3 (H) 09/15/2016   PLT 165 09/15/2016   GLUCOSE 97 09/15/2016   CHOL 248 (H) 09/15/2016   TRIG 111 09/15/2016   HDL 140 09/15/2016   LDLDIRECT 103.8 01/10/2013   LDLCALC 86 09/15/2016   ALT 30 (H) 09/15/2016   AST 39 (H) 09/15/2016   NA 137 09/15/2016   K 4.4 09/15/2016   CL 102 09/15/2016   CREATININE 1.09 (H) 09/15/2016   BUN 24 09/15/2016   CO2 22 09/15/2016   TSH 2.20 09/15/2016   HGBA1C 5.0 12/07/2014    Lab Results  Component Value Date   TSH 2.20 09/15/2016   Lab Results  Component Value Date   WBC 5.0 09/15/2016   HGB 15.5 09/15/2016   HCT 46.3 (H) 09/15/2016   MCV 88.9 09/15/2016   PLT 165 09/15/2016   Lab Results  Component Value Date   NA 137 09/15/2016   K 4.4 09/15/2016   CO2 22 09/15/2016   GLUCOSE 97 09/15/2016   BUN 24 09/15/2016   CREATININE 1.09 (H) 09/15/2016   BILITOT 0.7 09/15/2016   ALKPHOS 104 09/15/2016   AST 39 (H) 09/15/2016   ALT 30 (H) 09/15/2016   PROT 6.8 09/15/2016   ALBUMIN 4.2 09/15/2016   CALCIUM 11.0 (H) 09/15/2016   CALCIUM 11.0 (H) 09/15/2016   ANIONGAP 7 11/10/2014   GFR 74.51 10/11/2015    Lab Results  Component Value Date   CHOL 248 (H) 09/15/2016   Lab Results  Component Value Date   HDL 140 09/15/2016   Lab Results  Component Value Date   LDLCALC 86 09/15/2016   Lab Results  Component Value Date   TRIG 111 09/15/2016   Lab Results  Component Value Date   CHOLHDL 1.8 09/15/2016   Lab Results  Component Value Date   HGBA1C 5.0 12/07/2014         Assessment & Plan:   Problem List Items Addressed This Visit    Anxiety and depression    Doing well on current meds refills on Alprazolam today      GERD (gastroesophageal reflux disease)    Symptoms only once or twice a month      Overweight    Encouraged DASH diet, decrease po intake and increase exercise as tolerated. Needs 7-8 hours of sleep nightly. Avoid trans fats, eat small, frequent meals every 4-5 hours with lean proteins, complex carbs and healthy fats. Minimize simple carbs,       Preventative health care - Primary   Relevant Orders   CBC   Comprehensive metabolic panel   TSH   Hyperlipidemia    Encouraged heart healthy diet, increase exercise, avoid trans fats, consider a krill oil cap daily      Relevant Orders   Lipid panel   Vitamin D deficiency    Check D today, continue weekly tab for now      Relevant Orders   Vitamin D 1,25 dihydroxy   High serum parathyroid hormone (PTH)    Check level      Relevant Orders   PTH, Intact and Calcium   Skin lesion of breast    Lesion under right breast present for months but now is bleeding at times, will refer to dermatology for further  consideration      Relevant Orders   Ambulatory referral to Dermatology   Polyp of colon    Due to family history she is on a 5 year recall for colonoscopy and she is due so referral is placed      Relevant Orders   Ambulatory referral to Gastroenterology   Osteopenia    Repeat Dexa scan. Encouraged to get adequate exercise and vitamin d intake      Relevant Orders   DG Bone Density     Other Visit Diagnoses    Adjustment disorder with mixed anxiety and depressed mood       Relevant Medications   ALPRAZolam (XANAX) 0.5 MG tablet   Breast cancer screening       Relevant Orders   MM SCREENING BREAST TOMO BILATERAL      I have discontinued Ms. Jeffus's HYDROcodone-homatropine and sulfamethoxazole-trimethoprim. I am also having her maintain her multivitamin, Krill Oil, Coenzyme Q10 (CO Q 10 PO), Vitamin D (Ergocalciferol), metoprolol succinate, pantoprazole, sertraline, and ALPRAZolam.  Meds ordered this encounter  Medications  . ALPRAZolam (XANAX) 0.5 MG tablet    Sig: Take 1/2 to 1 tablet by mouth twice a day as needed for anxiety    Dispense:  60 tablet    Refill:  3    Not to exceed 5 additional fills before 03/14/2017    CMA served as scribe during this visit. History, Physical and Plan performed by medical provider. Documentation and orders reviewed and attested to.  Penni Homans, MD

## 2017-02-27 NOTE — Assessment & Plan Note (Signed)
Doing well on current meds refills on Alprazolam today

## 2017-02-27 NOTE — Patient Instructions (Addendum)
shingrix is the new shingles shot. 2 shots over 6 months, confirm your insurance will cover  Preventive Care 40-64 Years, Female Preventive care refers to lifestyle choices and visits with your health care provider that can promote health and wellness. What does preventive care include?  A yearly physical exam. This is also called an annual well check.  Dental exams once or twice a year.  Routine eye exams. Ask your health care provider how often you should have your eyes checked.  Personal lifestyle choices, including: ? Daily care of your teeth and gums. ? Regular physical activity. ? Eating a healthy diet. ? Avoiding tobacco and drug use. ? Limiting alcohol use. ? Practicing safe sex. ? Taking low-dose aspirin daily starting at age 33. ? Taking vitamin and mineral supplements as recommended by your health care provider. What happens during an annual well check? The services and screenings done by your health care provider during your annual well check will depend on your age, overall health, lifestyle risk factors, and family history of disease. Counseling Your health care provider may ask you questions about your:  Alcohol use.  Tobacco use.  Drug use.  Emotional well-being.  Home and relationship well-being.  Sexual activity.  Eating habits.  Work and work Statistician.  Method of birth control.  Menstrual cycle.  Pregnancy history.  Screening You may have the following tests or measurements:  Height, weight, and BMI.  Blood pressure.  Lipid and cholesterol levels. These may be checked every 5 years, or more frequently if you are over 52 years old.  Skin check.  Lung cancer screening. You may have this screening every year starting at age 11 if you have a 30-pack-year history of smoking and currently smoke or have quit within the past 15 years.  Fecal occult blood test (FOBT) of the stool. You may have this test every year starting at age  43.  Flexible sigmoidoscopy or colonoscopy. You may have a sigmoidoscopy every 5 years or a colonoscopy every 10 years starting at age 19.  Hepatitis C blood test.  Hepatitis B blood test.  Sexually transmitted disease (STD) testing.  Diabetes screening. This is done by checking your blood sugar (glucose) after you have not eaten for a while (fasting). You may have this done every 1-3 years.  Mammogram. This may be done every 1-2 years. Talk to your health care provider about when you should start having regular mammograms. This may depend on whether you have a family history of breast cancer.  BRCA-related cancer screening. This may be done if you have a family history of breast, ovarian, tubal, or peritoneal cancers.  Pelvic exam and Pap test. This may be done every 3 years starting at age 15. Starting at age 25, this may be done every 5 years if you have a Pap test in combination with an HPV test.  Bone density scan. This is done to screen for osteoporosis. You may have this scan if you are at high risk for osteoporosis.  Discuss your test results, treatment options, and if necessary, the need for more tests with your health care provider. Vaccines Your health care provider may recommend certain vaccines, such as:  Influenza vaccine. This is recommended every year.  Tetanus, diphtheria, and acellular pertussis (Tdap, Td) vaccine. You may need a Td booster every 10 years.  Varicella vaccine. You may need this if you have not been vaccinated.  Zoster vaccine. You may need this after age 58.  Measles, mumps, and  rubella (MMR) vaccine. You may need at least one dose of MMR if you were born in 1957 or later. You may also need a second dose.  Pneumococcal 13-valent conjugate (PCV13) vaccine. You may need this if you have certain conditions and were not previously vaccinated.  Pneumococcal polysaccharide (PPSV23) vaccine. You may need one or two doses if you smoke cigarettes or if you  have certain conditions.  Meningococcal vaccine. You may need this if you have certain conditions.  Hepatitis A vaccine. You may need this if you have certain conditions or if you travel or work in places where you may be exposed to hepatitis A.  Hepatitis B vaccine. You may need this if you have certain conditions or if you travel or work in places where you may be exposed to hepatitis B.  Haemophilus influenzae type b (Hib) vaccine. You may need this if you have certain conditions.  Talk to your health care provider about which screenings and vaccines you need and how often you need them. This information is not intended to replace advice given to you by your health care provider. Make sure you discuss any questions you have with your health care provider. Document Released: 08/13/2015 Document Revised: 04/05/2016 Document Reviewed: 05/18/2015 Elsevier Interactive Patient Education  2017 [employer].

## 2017-02-27 NOTE — Assessment & Plan Note (Signed)
Check D today, continue weekly tab for now

## 2017-02-27 NOTE — Assessment & Plan Note (Signed)
Symptoms only once or twice a month

## 2017-02-28 LAB — PTH, INTACT AND CALCIUM
CALCIUM: 10.2 mg/dL (ref 8.6–10.4)
PTH: 193 pg/mL — AB (ref 14–64)

## 2017-03-03 LAB — VITAMIN D 1,25 DIHYDROXY
VITAMIN D 1, 25 (OH) TOTAL: 47 pg/mL (ref 18–72)
VITAMIN D3 1, 25 (OH): 22 pg/mL
Vitamin D2 1, 25 (OH)2: 25 pg/mL

## 2017-03-19 DIAGNOSIS — L918 Other hypertrophic disorders of the skin: Secondary | ICD-10-CM | POA: Diagnosis not present

## 2017-03-19 DIAGNOSIS — L821 Other seborrheic keratosis: Secondary | ICD-10-CM | POA: Diagnosis not present

## 2017-03-30 ENCOUNTER — Encounter: Payer: Self-pay | Admitting: Family Medicine

## 2017-05-31 ENCOUNTER — Other Ambulatory Visit: Payer: Self-pay | Admitting: Family Medicine

## 2017-08-26 ENCOUNTER — Other Ambulatory Visit: Payer: Self-pay | Admitting: Family Medicine

## 2017-08-27 ENCOUNTER — Other Ambulatory Visit: Payer: Self-pay | Admitting: Family Medicine

## 2017-08-30 ENCOUNTER — Encounter: Payer: Self-pay | Admitting: Family Medicine

## 2017-08-30 ENCOUNTER — Ambulatory Visit: Payer: 59 | Admitting: Family Medicine

## 2017-08-30 VITALS — BP 118/66 | HR 79 | Temp 98.7°F | Resp 18

## 2017-08-30 DIAGNOSIS — F419 Anxiety disorder, unspecified: Secondary | ICD-10-CM

## 2017-08-30 DIAGNOSIS — F329 Major depressive disorder, single episode, unspecified: Secondary | ICD-10-CM | POA: Diagnosis not present

## 2017-08-30 DIAGNOSIS — E559 Vitamin D deficiency, unspecified: Secondary | ICD-10-CM | POA: Diagnosis not present

## 2017-08-30 DIAGNOSIS — R7989 Other specified abnormal findings of blood chemistry: Secondary | ICD-10-CM | POA: Diagnosis not present

## 2017-08-30 DIAGNOSIS — F32A Depression, unspecified: Secondary | ICD-10-CM

## 2017-08-30 DIAGNOSIS — M8580 Other specified disorders of bone density and structure, unspecified site: Secondary | ICD-10-CM

## 2017-08-30 DIAGNOSIS — Z23 Encounter for immunization: Secondary | ICD-10-CM | POA: Diagnosis not present

## 2017-08-30 DIAGNOSIS — E782 Mixed hyperlipidemia: Secondary | ICD-10-CM

## 2017-08-30 DIAGNOSIS — R945 Abnormal results of liver function studies: Secondary | ICD-10-CM

## 2017-08-30 LAB — COMPREHENSIVE METABOLIC PANEL
ALBUMIN: 4 g/dL (ref 3.5–5.2)
ALK PHOS: 93 U/L (ref 39–117)
ALT: 26 U/L (ref 0–35)
AST: 32 U/L (ref 0–37)
BILIRUBIN TOTAL: 0.8 mg/dL (ref 0.2–1.2)
BUN: 18 mg/dL (ref 6–23)
CO2: 31 mEq/L (ref 19–32)
Calcium: 10.5 mg/dL (ref 8.4–10.5)
Chloride: 103 mEq/L (ref 96–112)
Creatinine, Ser: 0.87 mg/dL (ref 0.40–1.20)
GFR: 72.02 mL/min (ref >60.00)
Glucose, Bld: 98 mg/dL (ref 70–99)
Potassium: 4.9 mEq/L (ref 3.5–5.1)
Sodium: 138 mEq/L (ref 135–145)
TOTAL PROTEIN: 6.9 g/dL (ref 6.0–8.3)

## 2017-08-30 LAB — CBC
HCT: 42.9 % (ref 36.0–46.0)
Hemoglobin: 14.6 g/dL (ref 12.0–15.0)
MCHC: 34.1 g/dL (ref 30.0–36.0)
MCV: 90.7 fl (ref 78.0–100.0)
PLATELETS: 152 10*3/uL (ref 150.0–400.0)
RBC: 4.73 Mil/uL (ref 3.87–5.11)
RDW: 14 % (ref 11.5–15.5)
WBC: 3.5 10*3/uL — ABNORMAL LOW (ref 4.0–10.5)

## 2017-08-30 LAB — LIPID PANEL
CHOLESTEROL: 219 mg/dL — AB (ref 0–200)
HDL: 101.3 mg/dL (ref >39.00)
LDL Cholesterol: 96 mg/dL (ref 0–99)
NonHDL: 117.47
Total CHOL/HDL Ratio: 2
Triglycerides: 106 mg/dL (ref 0.0–149.0)
VLDL: 21.2 mg/dL (ref 0.0–40.0)

## 2017-08-30 LAB — TSH: TSH: 2.73 u[IU]/mL (ref 0.35–4.50)

## 2017-08-30 LAB — VITAMIN D 25 HYDROXY (VIT D DEFICIENCY, FRACTURES): VITD: 20.79 ng/mL — ABNORMAL LOW (ref 30.00–100.00)

## 2017-08-30 MED ORDER — PANTOPRAZOLE SODIUM 40 MG PO TBEC: DELAYED_RELEASE_TABLET | ORAL | 1 refills | Status: DC

## 2017-08-30 NOTE — Assessment & Plan Note (Addendum)
Level improved, asymptomatic, continue to monitor

## 2017-08-30 NOTE — Assessment & Plan Note (Addendum)
Doing well on current meds, has not needed Alprazolam over past 6 months. Continue Sertraline.

## 2017-08-30 NOTE — Patient Instructions (Signed)
Hypercalcemia  Hypercalcemia is having too much calcium in the blood. The body needs calcium to make bones and keep them strong. Calcium also helps the muscles, nerves, brain, and heart work the way they should.  Most of the calcium in the body is in the bones. There is also some calcium in the blood. Hypercalcemia can happen when calcium comes out of the bones, or when the kidneys are not able to remove calcium from the blood. Hypercalcemia can be mild or severe.  What are the causes?  There are many possible causes of hypercalcemia. Common causes include:   Hyperparathyroidism. This is a condition in which the body produces too much parathyroid hormone. There are four parathyroid glands in your neck. These glands produce a chemical messenger (hormone) that helps the body absorb calcium from foods and helps your bones release calcium.   Certain kinds of cancer, such as lung cancer, breast cancer, or myeloma.    Less common causes of hypercalcemia include:   Getting too much calcium or vitamin D from your diet.   Kidney failure.   Hyperthyroidism.   Being on bed rest for a long time.   Certain medicines.   Infections.   Sarcoidosis.    What increases the risk?  This condition is more likely to develop in:   Women.   People who are 60 years or older.   People who have a family history of hypercalcemia.    What are the signs or symptoms?  Mild hypercalcemia that starts slowly may not cause symptoms. Severe, sudden hypercalcemia is more likely to cause symptoms, such as:   Loss of appetite.   Increased thirst and frequent urination.   Fatigue.   Nausea and vomiting.   Headache.   Abdominal pain.   Muscle pain, twitching, or weakness.   Constipation.   Blood in the urine.   Pain in the side of the back (flank pain).   Anxiety, confusion, or depression.   Irregular heartbeat (arrhythmia).   Loss of consciousness.    How is this diagnosed?  This condition may be diagnosed based on:   Your  symptoms.   Blood tests.   Urine tests.   X-rays.   Ultrasound.   MRI.   CT scan.    How is this treated?  Treatment for hypercalcemia depends on the cause. Treatment may include:   Receiving fluids through an IV tube.   Medicines that keep calcium levels steady after receiving fluids (loop diuretics).   Medicines that keep calcium in your bones (bisphosphonates).   Medicines that lower the calcium level in your blood.   Surgery to remove overactive parathyroid glands.    Follow these instructions at home:   Take over-the-counter and prescription medicines only as told by your health care provider.   Follow instructions from your health care provider about eating or drinking restrictions.   Drink enough fluid to keep your urine clear or pale yellow.   Stay active. Weight-bearing exercise helps to keep calcium in your bones. Follow instructions from your health care provider about what type and level of exercise is safe for you.   Keep all follow-up visits as told by your health care provider. This is important.  Contact a health care provider if:   You have a fever.   You have flank or abdominal pain that is getting worse.  Get help right away if:   You have severe abdominal or flank pain.   You have chest pain.     You have trouble breathing.   You become very confused and sleepy.   You lose consciousness.  This information is not intended to replace advice given to you by your health care provider. Make sure you discuss any questions you have with your health care provider.  Document Released: 09/30/2004 Document Revised: 12/23/2015 Document Reviewed: 12/02/2014  Elsevier Interactive Patient Education  2018 [employer]

## 2017-08-30 NOTE — Assessment & Plan Note (Signed)
Check cmp 

## 2017-08-30 NOTE — Progress Notes (Signed)
Subjective:  I acted as a Education administrator for Dr. Charlett Blake. [place], [place]  Patient ID: Sheryl [name], female    DOB: Mar 08, 1961, 57 y.o.   MRN: [id number]  No chief complaint on file.   HPI  Patient is in today for a 6 month follow up and she is doing well. She has had no need of Alprazolam since last visit. Sertraline has been helpful. No concerns regarding suicidal ideation or anhedonia despite stress. Denies CP/palp/SOB/HA/congestion/fevers/GI or GU c/o. Taking meds as prescribed  Patient Care Team: Mosie Lukes, MD as PCP - General (Family Medicine)   Past Medical History:  Diagnosis Date  . Anemia   . Anxiety   . Anxiety and depression    Follows with Dr Gala Murdoch q 6 months Has taken Zoloft in past without good results   . Chicken pox as a child  . Depression   . Elevated BP 02/06/2012  . FH: colon cancer 06/07/2011  . GERD (gastroesophageal reflux disease)   . 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 Hyperlipidemia 05/08/2012  . Osteopenia 02/27/2017  . Overweight(278.02)   . Preventative health care 06/07/2011  . Sacral pain 05/08/2012  . Tachycardia 11/10/2014  . Vitamin D deficiency 05/09/2014    Past Surgical History:  Procedure Laterality Date  . [place]   X 2  . COLONOSCOPY    . GUM SURGERY  2007  . right hand surgery  2009   infection debrided    Family History  Problem Relation Age of Onset  . Heart attack Mother   . Diabetes Mother        type 2  . Hypertension Mother   . Hyperlipidemia Mother   . Heart disease Mother   . Cancer Father        colon  . Heart attack Father   . Colon cancer Father   . Hypertension Sister   . Diabetes Sister        type 2  . Hypertension Sister   . Diabetes Maternal Grandmother   . Hypertension Maternal Grandmother   . Hyperlipidemia Maternal Grandmother     Social History   Socioeconomic History  . Marital status: Married    Spouse name: Not on file  . Number of children:  Not on file  . Years of education: Not on file  . Highest education level: Not on file  Social Needs  . Financial resource strain: Not on file  . Food insecurity - worry: Not on file  . Food insecurity - inability: Not on file  . Transportation needs - medical: Not on file  . Transportation needs - non-medical: Not on file  Occupational History  . Not on file  Tobacco Use  . Smoking status: Never Smoker  . Smokeless tobacco: Never Used  Substance and Sexual Activity  . Alcohol use: Yes    Alcohol/week: 21.0 oz    Types: 21 Glasses of wine, 14 Standard drinks or equivalent per week    Comment: glass of wine nightly  . Drug use: No  . Sexual activity: Yes    Partners: Male    Comment: no dietary restrictions, lives with husband  Other Topics Concern  . Not on file  Social History Narrative  . Not on file    Outpatient Medications Prior to Visit  Medication Sig Dispense Refill  . Coenzyme Q10 (CO Q 10 PO) Take 1 capsule by mouth daily.    Astrid Drafts  CAPS 1 cap daily MegaRed by Schiff    . metoprolol succinate (TOPROL-XL) 50 MG 24 hr tablet TAKE 1 TABLET BY MOUTH DAILY WITH OR IMMEDIATELY FOLLOWING A MEAL 90 tablet 1  . Multiple Vitamin (MULTIVITAMIN) capsule Take 1 capsule by mouth daily.      . sertraline (ZOLOFT) 100 MG tablet TAKE 1.5 TABLETS (150 MG TOTAL) BY MOUTH DAILY. 135 tablet 1  . ALPRAZolam (XANAX) 0.5 MG tablet Take 1/2 to 1 tablet by mouth twice a day as needed for anxiety 60 tablet 3  . pantoprazole (PROTONIX) 40 MG tablet TAKE 1 TABLET (40 MG TOTAL) BY MOUTH DAILY AS NEEDED. 90 tablet 1  . Vitamin D, Ergocalciferol, (DRISDOL) 50000 units CAPS capsule TAKE 1 CAPSULE (50,000 UNITS TOTAL) BY MOUTH ONCE A WEEK. 12 capsule 1   No facility-administered medications prior to visit.     Allergies  Allergen Reactions  . Penicillins Rash    Review of Systems  Constitutional: Negative for fever and malaise/fatigue.  HENT: Negative for congestion.   Eyes: Negative  for blurred vision.  Respiratory: Negative for shortness of breath.   Cardiovascular: Negative for chest pain, palpitations and leg swelling.  Gastrointestinal: Negative for abdominal pain, blood in stool and nausea.  Genitourinary: Negative for dysuria and frequency.  Musculoskeletal: Negative for falls.  Skin: Negative for rash.  Neurological: Negative for dizziness, loss of consciousness and headaches.  Endo/Heme/Allergies: Negative for environmental allergies.  Psychiatric/Behavioral: Negative for depression. The patient is not nervous/anxious.        Objective:    Physical Exam  BP 118/66 (BP Location: Left Arm, Patient Position: Sitting, Cuff Size: Normal)   Pulse 79   Temp 98.7 F (37.1 C) (Oral)   Resp 18   LMP 09/08/2011   SpO2 97%  Wt Readings from Last 3 Encounters:  01/18/17 160 lb (72.6 kg)  09/15/16 162 lb 6.4 oz (73.7 kg)  10/11/15 163 lb 4 oz (74 kg)   BP Readings from Last 3 Encounters:  08/30/17 118/66  01/18/17 98/78  09/15/16 126/82     Immunization History  Administered Date(s) Administered  . Influenza,inj,Quad PF,6+ Mos 05/08/2014, 04/12/2015, 08/30/2017  . Influenza-Unspecified 05/08/2013, 05/12/2016  . Tdap 06/07/2011    Health Maintenance  Topic Date Due  . Hepatitis C Screening  08-Jan-1961  . HIV Screening  04/15/1978  . PAP SMEAR  04/11/2018  . MAMMOGRAM  02/28/2019  . TETANUS/TDAP  06/06/2021  . COLONOSCOPY  07/10/2021  . INFLUENZA VACCINE  Completed    Lab Results  Component Value Date   WBC 3.5 (L) 08/30/2017   HGB 14.6 08/30/2017   HCT 42.9 08/30/2017   PLT 152.0 08/30/2017   GLUCOSE 98 08/30/2017   CHOL 219 (H) 08/30/2017   TRIG 106.0 08/30/2017   HDL 101.30 08/30/2017   LDLDIRECT 103.8 01/10/2013   LDLCALC 96 08/30/2017   ALT 26 08/30/2017   AST 32 08/30/2017   NA 138 08/30/2017   K 4.9 08/30/2017   CL 103 08/30/2017   CREATININE 0.87 08/30/2017   BUN 18 08/30/2017   CO2 31 08/30/2017   TSH 2.73 08/30/2017    HGBA1C 5.0 12/07/2014    Lab Results  Component Value Date   TSH 2.73 08/30/2017   Lab Results  Component Value Date   WBC 3.5 (L) 08/30/2017   HGB 14.6 08/30/2017   HCT 42.9 08/30/2017   MCV 90.7 08/30/2017   PLT 152.0 08/30/2017   Lab Results  Component Value Date   NA 138  08/30/2017   K 4.9 08/30/2017   CO2 31 08/30/2017   GLUCOSE 98 08/30/2017   BUN 18 08/30/2017   CREATININE 0.87 08/30/2017   BILITOT 0.8 08/30/2017   ALKPHOS 93 08/30/2017   AST 32 08/30/2017   ALT 26 08/30/2017   PROT 6.9 08/30/2017   ALBUMIN 4.0 08/30/2017   CALCIUM 10.5 08/30/2017   CALCIUM 10.7 (H) 08/30/2017   ANIONGAP 7 11/10/2014   GFR 72.02 08/30/2017   Lab Results  Component Value Date   CHOL 219 (H) 08/30/2017   Lab Results  Component Value Date   HDL 101.30 08/30/2017   Lab Results  Component Value Date   LDLCALC 96 08/30/2017   Lab Results  Component Value Date   TRIG 106.0 08/30/2017   Lab Results  Component Value Date   CHOLHDL 2 08/30/2017   Lab Results  Component Value Date   HGBA1C 5.0 12/07/2014         Assessment & Plan:   Problem List Items Addressed This Visit    Anxiety and depression    Doing well on current meds, has not needed Alprazolam over past 6 months. Continue Sertraline.      Relevant Orders   TSH (Completed)   Hyperlipidemia    Encouraged heart healthy diet, increase exercise, avoid trans fats, consider a krill oil cap daily      Relevant Orders   Lipid panel (Completed)   TSH (Completed)   Abnormal liver function test    Check cmp      Relevant Orders   CBC (Completed)   Comprehensive metabolic panel (Completed)   TSH (Completed)   Vitamin D deficiency    Check level. Still taking 50000 IU daily      Relevant Orders   TSH (Completed)   VITAMIN D 25 Hydroxy (Vit-D Deficiency, Fractures) (Completed)   High serum parathyroid hormone (PTH)    Level improved, asymptomatic, continue to monitor      Relevant Orders   CBC  (Completed)   TSH (Completed)   PTH, Intact and Calcium (Completed)   Osteopenia    Encouraged to get adequate exercise, calcium and vitamin d intake       Other Visit Diagnoses    Needs flu shot    -  Primary   Relevant Orders   Flu Vaccine QUAD 6+ mos PF IM (Fluarix Quad PF) (Completed)      I have discontinued Mills Koller Rovner "Kim"'s ALPRAZolam. I have also changed her Vitamin D (Ergocalciferol) to ergocalciferol. Additionally, I am having her maintain her multivitamin, Krill Oil, Coenzyme Q10 (CO Q 10 PO), sertraline, metoprolol succinate, and pantoprazole.  Meds ordered this encounter  Medications  . pantoprazole (PROTONIX) 40 MG tablet    Sig: TAKE 1 TABLET (40 MG TOTAL) BY MOUTH DAILY AS NEEDED.    Dispense:  90 tablet    Refill:  1  . ergocalciferol (DRISDOL) 50000 units capsule    Sig: Take 1 capsule (50,000 Units total) by mouth once a week.    Dispense:  4 capsule    Refill:  4    CMA served as scribe during this visit. History, Physical and Plan performed by medical provider. Documentation and orders reviewed and attested to.  Penni Homans, MD

## 2017-08-30 NOTE — Assessment & Plan Note (Signed)
Check level. Still taking 50000 IU daily

## 2017-08-30 NOTE — Assessment & Plan Note (Signed)
Encouraged heart healthy diet, increase exercise, avoid trans fats, consider a krill oil cap daily 

## 2017-08-31 LAB — PTH, INTACT AND CALCIUM
Calcium: 10.7 mg/dL — ABNORMAL HIGH (ref 8.6–10.4)
PTH: 148 pg/mL — ABNORMAL HIGH (ref 14–64)

## 2017-08-31 MED ORDER — ERGOCALCIFEROL 1.25 MG (50000 UT) PO CAPS: 50000.0000 [IU] | ORAL_CAPSULE | ORAL | 4 refills | Status: DC

## 2017-09-02 NOTE — Assessment & Plan Note (Signed)
Encouraged to get adequate exercise, calcium and vitamin d intake 

## 2017-09-05 ENCOUNTER — Other Ambulatory Visit: Payer: Self-pay

## 2017-09-05 DIAGNOSIS — E349 Endocrine disorder, unspecified: Secondary | ICD-10-CM

## 2017-09-05 DIAGNOSIS — R7989 Other specified abnormal findings of blood chemistry: Secondary | ICD-10-CM

## 2018-02-26 ENCOUNTER — Ambulatory Visit: Payer: 59 | Admitting: Family Medicine

## 2018-02-26 ENCOUNTER — Encounter: Payer: Self-pay | Admitting: Family Medicine

## 2018-02-26 VITALS — BP 118/90 | HR 96 | Temp 98.8°F | Resp 18 | Ht 61.0 in | Wt 169.2 lb

## 2018-02-26 DIAGNOSIS — R070 Pain in throat: Secondary | ICD-10-CM

## 2018-02-26 DIAGNOSIS — R509 Fever, unspecified: Secondary | ICD-10-CM | POA: Diagnosis not present

## 2018-02-26 DIAGNOSIS — J029 Acute pharyngitis, unspecified: Secondary | ICD-10-CM

## 2018-02-26 LAB — POCT RAPID STREP A (OFFICE): RAPID STREP A SCREEN: NEGATIVE

## 2018-02-26 MED ORDER — CEPHALEXIN 500 MG PO CAPS: 500.0000 mg | ORAL_CAPSULE | Freq: Two times a day (BID) | ORAL | 0 refills | Status: AC

## 2018-02-26 MED FILL — CEPHALEXIN 500 MG CAPSULE: 500 | 10 days supply | Qty: 20 | Fill #0

## 2018-02-26 NOTE — Patient Instructions (Signed)
Wonderful to meet you! Keflex 2 times per day for 10 days, be sure to finish full course May use tylenol or motrin as needed for pain or fever Do good handwashing I would also recommend getting a new toothbrush after on antibiotics for 24 hours to prevent re-infecting self

## 2018-02-26 NOTE — Progress Notes (Signed)
Subjective:    Patient ID: Sheryl [name], female    DOB: 06/27/1961, 57 y.o.   MRN: [id number]  HPI Patient presents to clinic with c/o pain in throat, fever for 1-2 days. Reports temp at home of 102 and took dose of motrin for this. [place] with [place] kids and daughter last week - states daughter dx with strep throat and both grandkids (ages 4 and 2) were sick with sore throat, runny nose symptoms. Also reports her older grandchild had impetigo rash on face.   Social History   Tobacco Use  . Smoking status: Never Smoker  . Smokeless tobacco: Never Used  Substance Use Topics  . Alcohol use: Yes    Alcohol/week: 21.0 oz    Types: 21 Glasses of wine, 14 Standard drinks or equivalent per week    Comment: glass of wine nightly    Patient Active Problem List   Diagnosis Date Noted  . Skin lesion of breast 02/27/2017  . Polyp of colon 02/27/2017  . Osteopenia 02/27/2017  . 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 Vitamin D deficiency 05/09/2014  . Bilirubin in urine 11/12/2013  . Abnormal liver function test 06/05/2013  . Hyperlipidemia 05/08/2012  . Cervical cancer screening 08/18/2011  . Anemia 06/07/2011  . FH: colon cancer 06/07/2011  . Anxiety and depression   . GERD (gastroesophageal reflux disease)   . Chicken pox   . Overweight     Review of Systems  Constitutional: Positive for chills, fatigue and fever. Negative for diaphoresis.  HENT: Positive for congestion, ear pain, postnasal drip and sore throat. Negative for dental problem, nosebleeds, sinus pressure and sinus pain.   Eyes: Negative for photophobia, pain and visual disturbance.  Respiratory: Negative for apnea, cough, chest tightness, shortness of breath and wheezing.   Cardiovascular: Negative for chest pain, palpitations and leg swelling.  Gastrointestinal: Negative for diarrhea, nausea and vomiting.  Musculoskeletal:       Generalized body aches  Skin: Negative for pallor and  rash.  Neurological: Negative for dizziness, tremors, weakness and headaches.       Objective:   Physical Exam  Constitutional: She is oriented to person, place, and time. She appears well-developed and well-nourished. No distress.  HENT:  Head: Normocephalic and atraumatic.  Right Ear: Hearing, tympanic membrane, external ear and ear canal normal.  Left Ear: Hearing, tympanic membrane, external ear and ear canal normal.  Nose: Nose normal.  Mouth/Throat: Tonsils are 2+ on the right. Tonsils are 2+ on the left. Tonsillar exudate.  Throat red and swollen. +Post nasal drip.  Eyes: Conjunctivae and EOM are normal. Right eye exhibits no discharge. Left eye exhibits no discharge. No scleral icterus.  Neck: Neck supple. No tracheal deviation present.  Cardiovascular: Normal rate, regular rhythm and normal heart sounds. Exam reveals no gallop and no friction rub.  No murmur heard. Pulmonary/Chest: Effort normal and breath sounds normal. No respiratory distress. She has no wheezes. She has no rales. She exhibits no tenderness.  Musculoskeletal: Normal range of motion. She exhibits no deformity.  Lymphadenopathy:    She has cervical adenopathy.  Neurological: She is alert and oriented to person, place, and time. No cranial nerve deficit.  Skin: Skin is warm and dry. No pallor.  Psychiatric: She has a normal mood and affect. Her behavior is normal. Thought content normal.  Nursing note and vitals reviewed.   Blood pressure 118/90, pulse 96, temperature 98.8 F (37.1 C), temperature source Oral, resp.  rate 18, height 5\' 1"  (1.549 m), weight 169 lb 3.2 oz (76.7 kg), last menstrual period 09/08/2011, SpO2 97 %.   Assessment & Plan:  Rapid Strep negative in clinic, but due to +close contact exposure to strep and physical exam findings, I will treat.  Exudative pharyngitis - keflex BID for 10 days. Reports penicillin allergy, but has taken keflex in past without issues.  Pain in throat - can  do salt water gargles, take tylenol or motrin PRN pain  Fever - can take tylenol or motrin PRN fever

## 2018-02-26 NOTE — Progress Notes (Deleted)
ll

## 2018-02-28 ENCOUNTER — Ambulatory Visit (INDEPENDENT_AMBULATORY_CARE_PROVIDER_SITE_OTHER): Payer: 59 | Admitting: Family Medicine

## 2018-02-28 ENCOUNTER — Encounter: Payer: Self-pay | Admitting: Family Medicine

## 2018-02-28 VITALS — BP 132/82 | HR 81 | Temp 98.5°F | Ht 61.0 in | Wt 167.0 lb

## 2018-02-28 DIAGNOSIS — Z124 Encounter for screening for malignant neoplasm of cervix: Secondary | ICD-10-CM | POA: Diagnosis not present

## 2018-02-28 DIAGNOSIS — K635 Polyp of colon: Secondary | ICD-10-CM | POA: Diagnosis not present

## 2018-02-28 DIAGNOSIS — Z1231 Encounter for screening mammogram for malignant neoplasm of breast: Secondary | ICD-10-CM

## 2018-02-28 DIAGNOSIS — J029 Acute pharyngitis, unspecified: Secondary | ICD-10-CM

## 2018-02-28 DIAGNOSIS — B081 Molluscum contagiosum: Secondary | ICD-10-CM

## 2018-02-28 DIAGNOSIS — F329 Major depressive disorder, single episode, unspecified: Secondary | ICD-10-CM

## 2018-02-28 DIAGNOSIS — Z0000 Encounter for general adult medical examination without abnormal findings: Secondary | ICD-10-CM | POA: Diagnosis not present

## 2018-02-28 DIAGNOSIS — M8580 Other specified disorders of bone density and structure, unspecified site: Secondary | ICD-10-CM

## 2018-02-28 DIAGNOSIS — E782 Mixed hyperlipidemia: Secondary | ICD-10-CM | POA: Diagnosis not present

## 2018-02-28 DIAGNOSIS — E213 Hyperparathyroidism, unspecified: Secondary | ICD-10-CM | POA: Diagnosis not present

## 2018-02-28 DIAGNOSIS — F32A Depression, unspecified: Secondary | ICD-10-CM

## 2018-02-28 DIAGNOSIS — Z1239 Encounter for other screening for malignant neoplasm of breast: Secondary | ICD-10-CM

## 2018-02-28 DIAGNOSIS — R7989 Other specified abnormal findings of blood chemistry: Secondary | ICD-10-CM | POA: Diagnosis not present

## 2018-02-28 DIAGNOSIS — K219 Gastro-esophageal reflux disease without esophagitis: Secondary | ICD-10-CM

## 2018-02-28 DIAGNOSIS — E559 Vitamin D deficiency, unspecified: Secondary | ICD-10-CM | POA: Diagnosis not present

## 2018-02-28 DIAGNOSIS — F419 Anxiety disorder, unspecified: Secondary | ICD-10-CM

## 2018-02-28 NOTE — Patient Instructions (Addendum)
Shingrix is the new shingles shot, 2 shots over 2-6 months. Call insurance and confirm coverage then can call for nurse appointment for shot  Folic Acud 1 mg tab daily   Molluscum Contagiosum, Adult Molluscum contagiosum is a skin infection that can cause a rash. When molluscum contagiosum affects the genital area, it is called a sexually transmitted disease (STD). What are the causes? Molluscum contagiosum is caused by a virus. The virus can spread easily from person to person through:  Skin-to-skin contact with an infected person.  Contact with an infected object, such as a towel or clothing.  What increases the risk? You may be at higher risk for molluscum contagiosum if you:  Live in an area where the weather is moist and warm.  Have a weak body defense system (immune system).  What are the signs or symptoms? The main symptom is a rash that appears 2-7 weeks after exposure to the virus. It is made up of 2-20 small, firm, dome-shaped bumps that may:  Be pink or flesh-colored.  Appear alone or in groups.  Range from the size of a pinhead to the size of a pencil eraser.  Feel smooth and waxy.  Have a pit in the middle.  Itch. The rash does not itch for most people.  The bumps often appears on the genitals, thighs, face, neck, and abdomen. How is this diagnosed? A health care provider can usually diagnose molluscum contagiosum by looking at the bumps on your skin. To confirm the diagnosis, your health care provider may scrape the bumps to collect a skin sample to examine under a microscope. How is this treated? The bumps may go away on their own, but people often have treatment to keep the virus from infecting someone else or to keep the rash from spreading to other body parts. Treatment may include:  Surgery to remove the bumps by freezing them (cryosurgery).  A procedure to scrape off the bumps (curettage).  A procedure to remove the bumps with a laser.  Putting  medicine on the bumps (topical treatment).  Sometimes no treatment is needed. Follow these instructions at home:  Take medicines only as directed by your health care provider.  As long as you have bumps on your skin, the infection can spread to others and to other parts of your body. To prevent this from happening: ? Do not scratch the bumps. ? Do not share clothing or towels with others until the bumps disappear. ? Avoid close contact with others until the bumps disappear. This includes sexual contact. ? Wash your hands often. ? Cover the bumps with clothing or a bandage when you will be near other people. Contact a health care provider if:  The bumps are spreading.  The bumps are becoming red and sore.  The bumps have not gone away after 12 months. This information is not intended to replace advice given to you by your health care provider. Make sure you discuss any questions you have with your health care provider. Document Released: 02/11/2014 Document Revised: 12/23/2015 Document Reviewed: 12/24/2013 Elsevier Interactive Patient Education  2018 [place] Years, Female Preventive care refers to lifestyle choices and visits with your health care provider that can promote health and wellness. What does preventive care include?  A yearly physical exam. This is also called an annual well check.  Dental exams once or twice a year.  Routine eye exams. Ask your health care provider how often you should have your eyes  checked.  Personal lifestyle choices, including: ? Daily care of your teeth and gums. ? Regular physical activity. ? Eating a healthy diet. ? Avoiding tobacco and drug use. ? Limiting alcohol use. ? Practicing safe sex. ? Taking low-dose aspirin daily starting at age 40. ? Taking vitamin and mineral supplements as recommended by your health care provider. What happens during an annual well check? The services and screenings done by  your health care provider during your annual well check will depend on your age, overall health, lifestyle risk factors, and family history of disease. Counseling Your health care provider may ask you questions about your:  Alcohol use.  Tobacco use.  Drug use.  Emotional well-being.  Home and relationship well-being.  Sexual activity.  Eating habits.  Work and work Statistician.  Method of birth control.  Menstrual cycle.  Pregnancy history.  Screening You may have the following tests or measurements:  Height, weight, and BMI.  Blood pressure.  Lipid and cholesterol levels. These may be checked every 5 years, or more frequently if you are over 108 years old.  Skin check.  Lung cancer screening. You may have this screening every year starting at age 48 if you have a 30-pack-year history of smoking and currently smoke or have quit within the past 15 years.  Fecal occult blood test (FOBT) of the stool. You may have this test every year starting at age 85.  Flexible sigmoidoscopy or colonoscopy. You may have a sigmoidoscopy every 5 years or a colonoscopy every 10 years starting at age 79.  Hepatitis C blood test.  Hepatitis B blood test.  Sexually transmitted disease (STD) testing.  Diabetes screening. This is done by checking your blood sugar (glucose) after you have not eaten for a while (fasting). You may have this done every 1-3 years.  Mammogram. This may be done every 1-2 years. Talk to your health care provider about when you should start having regular mammograms. This may depend on whether you have a family history of breast cancer.  BRCA-related cancer screening. This may be done if you have a family history of breast, ovarian, tubal, or peritoneal cancers.  Pelvic exam and Pap test. This may be done every 3 years starting at age 47. Starting at age 49, this may be done every 5 years if you have a Pap test in combination with an HPV test.  Bone density  scan. This is done to screen for osteoporosis. You may have this scan if you are at high risk for osteoporosis.  Discuss your test results, treatment options, and if necessary, the need for more tests with your health care provider. Vaccines Your health care provider may recommend certain vaccines, such as:  Influenza vaccine. This is recommended every year.  Tetanus, diphtheria, and acellular pertussis (Tdap, Td) vaccine. You may need a Td booster every 10 years.  Varicella vaccine. You may need this if you have not been vaccinated.  Zoster vaccine. You may need this after age 24.  Measles, mumps, and rubella (MMR) vaccine. You may need at least one dose of MMR if you were born in 1957 or later. You may also need a second dose.  Pneumococcal 13-valent conjugate (PCV13) vaccine. You may need this if you have certain conditions and were not previously vaccinated.  Pneumococcal polysaccharide (PPSV23) vaccine. You may need one or two doses if you smoke cigarettes or if you have certain conditions.  Meningococcal vaccine. You may need this if you have certain conditions.  Hepatitis A vaccine. You may need this if you have certain conditions or if you travel or work in places where you may be exposed to hepatitis A.  Hepatitis B vaccine. You may need this if you have certain conditions or if you travel or work in places where you may be exposed to hepatitis B.  Haemophilus influenzae type b (Hib) vaccine. You may need this if you have certain conditions.  Talk to your health care provider about which screenings and vaccines you need and how often you need them. This information is not intended to replace advice given to you by your health care provider. Make sure you discuss any questions you have with your health care provider. Document Released: 08/13/2015 Document Revised: 04/05/2016 Document Reviewed: 05/18/2015 Elsevier Interactive Patient Education  [employer].

## 2018-02-28 NOTE — Assessment & Plan Note (Signed)
Is taking her Vitamin D 50000 IU cap roughly every other week

## 2018-02-28 NOTE — Assessment & Plan Note (Signed)
Check PTH level

## 2018-02-28 NOTE — Assessment & Plan Note (Signed)
Encouraged heart healthy diet, increase exercise, avoid trans fats, consider a krill oil cap daily 

## 2018-02-28 NOTE — Progress Notes (Signed)
Pre visit review using our clinic review tool, if applicable. No additional management support is needed unless otherwise documented below in the visit note. 

## 2018-02-28 NOTE — Assessment & Plan Note (Signed)
Encouraged to get adequate exercise, calcium and vitamin d intake 

## 2018-03-01 LAB — CBC
HCT: 44.4 % (ref 36.0–46.0)
HEMOGLOBIN: 15.3 g/dL — AB (ref 12.0–15.0)
MCHC: 34.4 g/dL (ref 30.0–36.0)
MCV: 90.7 fl (ref 78.0–100.0)
PLATELETS: 138 10*3/uL — AB (ref 150.0–400.0)
RBC: 4.9 Mil/uL (ref 3.87–5.11)
RDW: 14 % (ref 11.5–15.5)
WBC: 3.4 10*3/uL — ABNORMAL LOW (ref 4.0–10.5)

## 2018-03-01 LAB — COMPREHENSIVE METABOLIC PANEL
ALBUMIN: 4.3 g/dL (ref 3.5–5.2)
ALK PHOS: 90 U/L (ref 39–117)
ALT: 28 U/L (ref 0–35)
AST: 33 U/L (ref 0–37)
BILIRUBIN TOTAL: 0.8 mg/dL (ref 0.2–1.2)
BUN: 21 mg/dL (ref 6–23)
CALCIUM: 10.5 mg/dL (ref 8.4–10.5)
CO2: 26 meq/L (ref 19–32)
CREATININE: 1.2 mg/dL (ref 0.40–1.20)
Chloride: 103 mEq/L (ref 96–112)
GFR: 49.6 mL/min — AB (ref >60.00)
Glucose, Bld: 94 mg/dL (ref 70–99)
Potassium: 4.5 mEq/L (ref 3.5–5.1)
Sodium: 137 mEq/L (ref 135–145)
TOTAL PROTEIN: 7 g/dL (ref 6.0–8.3)

## 2018-03-01 LAB — LIPID PANEL
CHOLESTEROL: 205 mg/dL — AB (ref 0–200)
HDL: 96.2 mg/dL (ref >39.00)
LDL Cholesterol: 86 mg/dL (ref 0–99)
NonHDL: 109.23
TRIGLYCERIDES: 117 mg/dL (ref 0.0–149.0)
Total CHOL/HDL Ratio: 2
VLDL: 23.4 mg/dL (ref 0.0–40.0)

## 2018-03-01 LAB — TSH: TSH: 3.78 u[IU]/mL (ref 0.35–4.50)

## 2018-03-01 LAB — VITAMIN D 25 HYDROXY (VIT D DEFICIENCY, FRACTURES): VITD: 37.32 ng/mL (ref 30.00–100.00)

## 2018-03-01 LAB — PTH, INTACT AND CALCIUM
CALCIUM: 10.6 mg/dL — AB (ref 8.6–10.4)
PTH: 229 pg/mL — ABNORMAL HIGH (ref 14–64)

## 2018-03-03 DIAGNOSIS — B081 Molluscum contagiosum: Secondary | ICD-10-CM | POA: Insufficient documentation

## 2018-03-03 DIAGNOSIS — J029 Acute pharyngitis, unspecified: Secondary | ICD-10-CM | POA: Insufficient documentation

## 2018-03-03 DIAGNOSIS — E213 Hyperparathyroidism, unspecified: Secondary | ICD-10-CM | POA: Insufficient documentation

## 2018-03-03 NOTE — Assessment & Plan Note (Signed)
Fever resolved and sore throat improving.

## 2018-03-03 NOTE — Assessment & Plan Note (Signed)
Patient encouraged to maintain heart healthy diet, regular exercise, adequate sleep. Consider daily probiotics. Take medications as prescribed 

## 2018-03-03 NOTE — Assessment & Plan Note (Signed)
Avoid offending foods, start probiotics. Do not eat large meals in late evening and consider raising head of bed.  

## 2018-03-03 NOTE — Progress Notes (Signed)
Subjective:    Patient ID: Sheryl [name], female    DOB: [birth year], 57 y.o.   MRN: [id number]  Chief Complaint  Patient presents with  . Annual Exam    HPI Patient is in today for annual preventative exam and follow-up on chronic medical problems including vitamin D deficiency, hyperlipidemia, depression and reflux.  She recently presented with a sore throat and a fever of 102 after holiday with her family.  Her young grandson had been ill.  She was started on Keflex and her fever has resolved.  Her sore throat is improving.  She notes her grandson did have impetigo.  She is complaining of 2 small circular lesions on her skin that are largely asymptomatic not pruritic.  They are on her lower right leg just above her ankle and anterior.  She otherwise reports she is been feeling well.  No recent febrile illness besides this 1 or hospitalizations.  She is trying to maintain a heart healthy diet stay active and spends quite a bit of time with her young grandchildren.  She has no difficulties with her activities of daily living.. Denies CP/palp/SOB/HA/congestion/fevers/GI or GU c/o. Taking meds as prescribed  Past Medical History:  Diagnosis Date  . Anemia   . Anxiety   . Anxiety and depression    Follows with Dr Gala Murdoch q 6 months Has taken Zoloft in past without good results   . Chicken pox as a child  . Depression   . Elevated BP 02/06/2012  . FH: colon cancer 06/07/2011  . GERD (gastroesophageal reflux disease)   . 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 Hyperlipidemia 05/08/2012  . Osteopenia 02/27/2017  . Overweight(278.02)   . Preventative health care 06/07/2011  . Sacral pain 05/08/2012  . Tachycardia 11/10/2014  . Vitamin D deficiency 05/09/2014    Past Surgical History:  Procedure Laterality Date  . [place]   X 2  . COLONOSCOPY    . GUM SURGERY  2007  . right hand surgery  2009   infection debrided    Family History  Problem  Relation Age of Onset  . Heart attack Mother   . Diabetes Mother        type 2  . Hypertension Mother   . Hyperlipidemia Mother   . Heart disease Mother   . Cancer Father        colon  . Heart attack Father   . Colon cancer Father   . Hypertension Sister   . Diabetes Sister        type 2  . Hypertension Sister   . Diabetes Maternal Grandmother   . Hypertension Maternal Grandmother   . Hyperlipidemia Maternal Grandmother     Social History   Socioeconomic History  . Marital status: Married    Spouse name: Not on file  . Number of children: Not on file  . Years of education: Not on file  . Highest education level: Not on file  Occupational History  . Not on file  Social Needs  . Financial resource strain: Not on file  . Food insecurity:    Worry: Not on file    Inability: Not on file  . Transportation needs:    Medical: Not on file    Non-medical: Not on file  Tobacco Use  . Smoking status: Never Smoker  . Smokeless tobacco: Never Used  Substance and Sexual Activity  . Alcohol use: Yes    Alcohol/week: 21.0  oz    Types: 21 Glasses of wine, 14 Standard drinks or equivalent per week    Comment: glass of wine nightly  . Drug use: No  . Sexual activity: Yes    Partners: Male    Comment: no dietary restrictions, lives with husband  Lifestyle  . Physical activity:    Days per week: Not on file    Minutes per session: Not on file  . Stress: Not on file  Relationships  . Social connections:    Talks on phone: Not on file    Gets together: Not on file    Attends religious service: Not on file    Active member of club or organization: Not on file    Attends meetings of clubs or organizations: Not on file    Relationship status: Not on file  . Intimate partner violence:    Fear of current or ex partner: Not on file    Emotionally abused: Not on file    Physically abused: Not on file    Forced sexual activity: Not on file  Other Topics Concern  . Not on file    Social History Narrative  . Not on file    Outpatient Medications Prior to Visit  Medication Sig Dispense Refill  . cephALEXin (KEFLEX) 500 MG capsule Take 1 capsule (500 mg total) by mouth 2 (two) times daily for 10 days. 20 capsule 0  . Coenzyme Q10 (CO Q 10 PO) Take 1 capsule by mouth daily.    . ergocalciferol (DRISDOL) 50000 units capsule Take 1 capsule (50,000 Units total) by mouth once a week. 4 capsule 4  . Krill Oil CAPS 1 cap daily MegaRed by Schiff    . metoprolol succinate (TOPROL-XL) 50 MG 24 hr tablet TAKE 1 TABLET BY MOUTH DAILY WITH OR IMMEDIATELY FOLLOWING A MEAL 90 tablet 1  . Multiple Vitamin (MULTIVITAMIN) capsule Take 1 capsule by mouth daily.      . pantoprazole (PROTONIX) 40 MG tablet TAKE 1 TABLET (40 MG TOTAL) BY MOUTH DAILY AS NEEDED. 90 tablet 1  . sertraline (ZOLOFT) 100 MG tablet TAKE 1.5 TABLETS (150 MG TOTAL) BY MOUTH DAILY. 135 tablet 1   No facility-administered medications prior to visit.     Allergies  Allergen Reactions  . Penicillins Rash    Review of Systems  Constitutional: Negative for chills, fever and malaise/fatigue.  HENT: Positive for sore throat. Negative for congestion and hearing loss.   Eyes: Negative for discharge.  Respiratory: Negative for cough, sputum production and shortness of breath.   Cardiovascular: Negative for chest pain, palpitations and leg swelling.  Gastrointestinal: Negative for abdominal pain, blood in stool, constipation, diarrhea, heartburn, nausea and vomiting.  Genitourinary: Negative for dysuria, frequency, hematuria and urgency.  Musculoskeletal: Negative for back pain, falls and myalgias.  Skin: Negative for rash.  Neurological: Negative for dizziness, sensory change, loss of consciousness, weakness and headaches.  Endo/Heme/Allergies: Negative for environmental allergies. Does not bruise/bleed easily.  Psychiatric/Behavioral: Negative for depression and suicidal ideas. The patient is not nervous/anxious  and does not have insomnia.        Objective:    Physical Exam  BP 132/82 (BP Location: Left Arm, Patient Position: Sitting, Cuff Size: Normal)   Pulse 81   Temp 98.5 F (36.9 C) (Oral)   Ht 5\' 1"  (1.549 m)   Wt 167 lb (75.8 kg)   LMP 09/08/2011   SpO2 93%   BMI 31.55 kg/m  Wt Readings from Last 3 Encounters:  02/28/18 167 lb (75.8 kg)  02/26/18 169 lb 3.2 oz (76.7 kg)  01/18/17 160 lb (72.6 kg)     Lab Results  Component Value Date   WBC 3.4 (L) 02/28/2018   HGB 15.3 (H) 02/28/2018   HCT 44.4 02/28/2018   PLT 138.0 (L) 02/28/2018   GLUCOSE 94 02/28/2018   CHOL 205 (H) 02/28/2018   TRIG 117.0 02/28/2018   HDL 96.20 02/28/2018   LDLDIRECT 103.8 01/10/2013   LDLCALC 86 02/28/2018   ALT 28 02/28/2018   AST 33 02/28/2018   NA 137 02/28/2018   K 4.5 02/28/2018   CL 103 02/28/2018   CREATININE 1.20 02/28/2018   BUN 21 02/28/2018   CO2 26 02/28/2018   TSH 3.78 02/28/2018   HGBA1C 5.0 12/07/2014    Lab Results  Component Value Date   TSH 3.78 02/28/2018   Lab Results  Component Value Date   WBC 3.4 (L) 02/28/2018   HGB 15.3 (H) 02/28/2018   HCT 44.4 02/28/2018   MCV 90.7 02/28/2018   PLT 138.0 (L) 02/28/2018   Lab Results  Component Value Date   NA 137 02/28/2018   K 4.5 02/28/2018   CO2 26 02/28/2018   GLUCOSE 94 02/28/2018   BUN 21 02/28/2018   CREATININE 1.20 02/28/2018   BILITOT 0.8 02/28/2018   ALKPHOS 90 02/28/2018   AST 33 02/28/2018   ALT 28 02/28/2018   PROT 7.0 02/28/2018   ALBUMIN 4.3 02/28/2018   CALCIUM 10.6 (H) 02/28/2018   CALCIUM 10.5 02/28/2018   ANIONGAP 7 11/10/2014   GFR 49.60 (L) 02/28/2018   Lab Results  Component Value Date   CHOL 205 (H) 02/28/2018   Lab Results  Component Value Date   HDL 96.20 02/28/2018   Lab Results  Component Value Date   LDLCALC 86 02/28/2018   Lab Results  Component Value Date   TRIG 117.0 02/28/2018   Lab Results  Component Value Date   CHOLHDL 2 02/28/2018   Lab Results    Component Value Date   HGBA1C 5.0 12/07/2014       Assessment & Plan:   Problem List Items Addressed This Visit    Anxiety and depression    Doing well on Sertraline 100 mg daily. No changes.       GERD (gastroesophageal reflux disease)    Avoid offending foods, start probiotics. Do not eat large meals in late evening and consider raising head of bed.       Preventative health care    Patient encouraged to maintain heart healthy diet, regular exercise, adequate sleep. Consider daily probiotics. Take medications as prescribed      Relevant Orders   CBC (Completed)   TSH (Completed)   Cervical cancer screening   Hyperlipidemia    Encouraged heart healthy diet, increase exercise, avoid trans fats, consider a krill oil cap daily      Relevant Orders   Lipid panel (Completed)   Vitamin D deficiency    Is taking her Vitamin D 50000 IU cap roughly every other week      Relevant Orders   VITAMIN D 25 Hydroxy (Vit-D Deficiency, Fractures) (Completed)   Comprehensive metabolic panel (Completed)   High serum parathyroid hormone (PTH)    Check PTH level      Relevant Orders   PTH, Intact and Calcium (Completed)   Polyp of colon   Relevant Orders   Ambulatory referral to Gastroenterology   Osteopenia    Encouraged to get adequate exercise,  calcium and vitamin d intake      Parathyroid hormone excess (HCC)    Persistent and increasing. Will refer to endocrinology for evaluation. Very mild elevation of calcium and asymptomatic. Minimize caclium in diet.       Molluscum contagiosum    2 small lesions on ight leg above foot. Encouraged folic acid and monitor      Pharyngitis    Fever resolved and sore throat improving.        Other Visit Diagnoses    Breast cancer screening    -  Primary   Relevant Orders   MM DIAG BREAST TOMO BILATERAL   MM 3D SCREEN BREAST BILATERAL      I am having Mills Koller. Gitto "Kim" maintain her multivitamin, Krill Oil, Coenzyme Q10  (CO Q 10 PO), sertraline, metoprolol succinate, pantoprazole, ergocalciferol, and cephALEXin.  No orders of the defined types were placed in this encounter.    Penni Homans, MD

## 2018-03-03 NOTE — Assessment & Plan Note (Signed)
Persistent and increasing. Will refer to endocrinology for evaluation. Very mild elevation of calcium and asymptomatic. Minimize caclium in diet.

## 2018-03-03 NOTE — Assessment & Plan Note (Signed)
Doing well on Sertraline 100 mg daily. No changes.

## 2018-03-03 NOTE — Assessment & Plan Note (Signed)
2 small lesions on ight leg above foot. Encouraged folic acid and monitor

## 2018-03-06 ENCOUNTER — Other Ambulatory Visit: Payer: Self-pay | Admitting: Family Medicine

## 2018-03-06 DIAGNOSIS — R7989 Other specified abnormal findings of blood chemistry: Secondary | ICD-10-CM

## 2018-03-17 ENCOUNTER — Other Ambulatory Visit: Payer: Self-pay | Admitting: Family Medicine

## 2018-03-21 ENCOUNTER — Other Ambulatory Visit: Payer: Self-pay | Admitting: Family Medicine

## 2018-05-15 ENCOUNTER — Encounter: Payer: Self-pay | Admitting: Endocrinology

## 2018-05-21 ENCOUNTER — Encounter: Payer: Self-pay | Admitting: Medical

## 2018-05-21 ENCOUNTER — Encounter: Payer: Self-pay | Admitting: Family Medicine

## 2018-05-21 ENCOUNTER — Telehealth: Payer: Self-pay | Admitting: Medical

## 2018-05-21 ENCOUNTER — Ambulatory Visit: Payer: 59 | Admitting: Medical

## 2018-05-21 VITALS — BP 139/95 | HR 125 | Temp 98.7°F | Resp 16

## 2018-05-21 DIAGNOSIS — N3001 Acute cystitis with hematuria: Secondary | ICD-10-CM | POA: Diagnosis not present

## 2018-05-21 DIAGNOSIS — R350 Frequency of micturition: Secondary | ICD-10-CM

## 2018-05-21 DIAGNOSIS — R319 Hematuria, unspecified: Secondary | ICD-10-CM

## 2018-05-21 LAB — POC URINALSYSI DIPSTICK (AUTOMATED)
BILIRUBIN UA: NEGATIVE
Glucose, UA: NEGATIVE
Ketones, UA: NEGATIVE
NITRITE UA: NEGATIVE
PH UA: 6 (ref 5.0–8.0)
Protein, UA: POSITIVE — AB
Spec Grav, UA: 1.025 (ref 1.010–1.025)
Urobilinogen, UA: NEGATIVE E.U./dL — AB

## 2018-05-21 MED ORDER — PHENAZOPYRIDINE HCL 200 MG PO TABS: 200.0000 mg | ORAL_TABLET | Freq: Three times a day (TID) | ORAL | 0 refills | Status: DC | PRN

## 2018-05-21 MED ORDER — NITROFURANTOIN MONOHYD MACRO 100 MG PO CAPS: 100.0000 mg | ORAL_CAPSULE | Freq: Two times a day (BID) | ORAL | 0 refills | Status: DC

## 2018-05-21 MED FILL — PHENAZOPYRIDINE 200 MG TAB: 200 | 3 days supply | Qty: 10 | Fill #0

## 2018-05-21 MED FILL — NITROFURANTOIN MONO-MCR 100: 100 | 7 days supply | Qty: 14 | Fill #0

## 2018-05-21 NOTE — Patient Instructions (Addendum)
Your appear to have a urinary tract infection. I am prescribing macrobid antibiotic for the probable infection. Hydrate well. I am sending out a urine culture. During the interim if your signs and symptoms worsen rather than improving please notify us. We will notify your when the culture results are back.  Will prescribe pyridium for urinary pain.  Would also recommend repeat ua to see if any blood persist post treatment.  Follow up in 10-14 days or as needed.

## 2018-05-21 NOTE — Telephone Encounter (Signed)
Future urine order placed to recheck for blood in urine.

## 2018-05-21 NOTE — Progress Notes (Signed)
Subjective:    Patient ID: Sheryl [name], female    DOB: [birth year], 57 y.o.   MRN: [id number]  HPI Pt in today reporting urinary symptoms x 1 day.  Dysuria- yes x 1 day.(severe) Frequent urination-yes Hesitancy-none Suprapubic pressure-yes Fever-no chills-no Nausea-no  Vomiting-no  CVA pain-no History of UTI-occasional uti. Gross hematuria-pt states looks bloody.  No hx of smoking.  Lmp- menopausal.     Review of Systems  Constitutional: Negative for chills, fatigue and fever.  Respiratory: Negative for cough, chest tightness, shortness of breath and wheezing.   Cardiovascular: Negative for chest pain and palpitations.  Gastrointestinal: Negative for abdominal pain.  Genitourinary: Positive for dysuria and frequency. Negative for enuresis, hematuria, urgency, vaginal bleeding and vaginal pain.  Musculoskeletal: Negative for back pain.  Neurological: Negative for facial asymmetry.  Hematological: Negative for adenopathy.  Psychiatric/Behavioral: Negative for behavioral problems.    Past Medical History:  Diagnosis Date  . Anemia   . Anxiety   . Anxiety and depression    Follows with Dr Gala Murdoch q 6 months Has taken Zoloft in past without good results   . Chicken pox as a child  . Depression   . Elevated BP 02/06/2012  . FH: colon cancer 06/07/2011  . GERD (gastroesophageal reflux disease)   . 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 Hyperlipidemia 05/08/2012  . Osteopenia 02/27/2017  . Overweight(278.02)   . Preventative health care 06/07/2011  . Sacral pain 05/08/2012  . Tachycardia 11/10/2014  . Vitamin D deficiency 05/09/2014     Social History   Socioeconomic History  . Marital status: Married    Spouse name: Not on file  . Number of children: Not on file  . Years of education: Not on file  . Highest education level: Not on file  Occupational History  . Not on file  Social Needs  . Financial resource strain: Not on file  . Food  insecurity:    Worry: Not on file    Inability: Not on file  . Transportation needs:    Medical: Not on file    Non-medical: Not on file  Tobacco Use  . Smoking status: Never Smoker  . Smokeless tobacco: Never Used  Substance and Sexual Activity  . Alcohol use: Yes    Alcohol/week: 35.0 standard drinks    Types: 21 Glasses of wine, 14 Standard drinks or equivalent per week    Comment: glass of wine nightly  . Drug use: No  . Sexual activity: Yes    Partners: Male    Comment: no dietary restrictions, lives with husband  Lifestyle  . Physical activity:    Days per week: Not on file    Minutes per session: Not on file  . Stress: Not on file  Relationships  . Social connections:    Talks on phone: Not on file    Gets together: Not on file    Attends religious service: Not on file    Active member of club or organization: Not on file    Attends meetings of clubs or organizations: Not on file    Relationship status: Not on file  . Intimate partner violence:    Fear of current or ex partner: Not on file    Emotionally abused: Not on file    Physically abused: Not on file    Forced sexual activity: Not on file  Other Topics Concern  . Not on file  Social History Narrative  . Not on file  Past Surgical History:  Procedure Laterality Date  . [place]   X 2  . COLONOSCOPY    . GUM SURGERY  2007  . right hand surgery  2009   infection debrided    Family History  Problem Relation Age of Onset  . Heart attack Mother   . Diabetes Mother        type 2  . Hypertension Mother   . Hyperlipidemia Mother   . Heart disease Mother   . Cancer Father        colon  . Heart attack Father   . Colon cancer Father   . Hypertension Sister   . Diabetes Sister        type 2  . Hypertension Sister   . Diabetes Maternal Grandmother   . Hypertension Maternal Grandmother   . Hyperlipidemia Maternal Grandmother     Allergies  Allergen Reactions  .  Penicillins Rash    Current Outpatient Medications on File Prior to Visit  Medication Sig Dispense Refill  . Coenzyme Q10 (CO Q 10 PO) Take 1 capsule by mouth daily.    . ergocalciferol (DRISDOL) 50000 units capsule Take 1 capsule (50,000 Units total) by mouth once a week. 4 capsule 4  . Krill Oil CAPS 1 cap daily MegaRed by Schiff    . metoprolol succinate (TOPROL-XL) 50 MG 24 hr tablet TAKE 1 TABLET BY MOUTH DAILY WITH OR IMMEDIATELY FOLLOWING A MEAL 90 tablet 1  . Multiple Vitamin (MULTIVITAMIN) capsule Take 1 capsule by mouth daily.      . pantoprazole (PROTONIX) 40 MG tablet TAKE 1 TABLET (40 MG TOTAL) BY MOUTH DAILY AS NEEDED. 90 tablet 1  . sertraline (ZOLOFT) 100 MG tablet TAKE 1.5 TABLETS (150 MG TOTAL) BY MOUTH DAILY. 135 tablet 1  . [DISCONTINUED] FLUoxetine (PROZAC) 20 MG capsule Take 20 mg by mouth daily.       No current facility-administered medications on file prior to visit.     BP (!) 139/95   Pulse (!) 125   Temp 98.7 F (37.1 C) (Oral)   Resp 16   LMP 09/08/2011   SpO2 100%       Objective:   Physical Exam  General Appearance- Not in acute distress.  HEENT Eyes- Scleraeral/Conjuntiva-bilat- Not Yellow. Mouth & Throat- Normal.  Chest and Lung Exam Auscultation: Breath sounds:-Normal. Adventitious sounds:- No Adventitious sounds.  Cardiovascular Auscultation:Rythm - Regular. Heart Sounds -Normal heart sounds.  Abdomen Inspection:-Inspection Normal.  Palpation/Perucssion: Palpation and Percussion of the abdomen reveal- faint suprapubic Tender, No Rebound tenderness, No rigidity(Guarding) and No Palpable abdominal masses.  Liver:-Normal.  Spleen:- Normal.   Back- no cva tenderness.      Assessment & Plan:  Your appear to have a urinary tract infection. I am prescribing macrobid antibiotic for the probable infection. Hydrate well. I am sending out a urine culture. During the interim if your signs and symptoms worsen rather than improving please  notify us. We will notify your when the culture results are back.  Will prescribe pyridium for urinary pain.  Would also recommend repeat ua to see if any blood persist post treatment  Follow up in 10-14 days or as needed.

## 2018-05-23 ENCOUNTER — Telehealth: Payer: Self-pay | Admitting: Medical

## 2018-05-23 LAB — URINE CULTURE
MICRO NUMBER: 91268776
SPECIMEN QUALITY: ADEQUATE

## 2018-05-23 MED ORDER — SULFAMETHOXAZOLE-TRIMETHOPRIM 800-160 MG PO TABS: 1.0000 | ORAL_TABLET | Freq: Two times a day (BID) | ORAL | 0 refills | Status: DC

## 2018-05-23 NOTE — Telephone Encounter (Signed)
Rx bactrim ds sent to pt pharmacy. 

## 2018-08-07 ENCOUNTER — Encounter: Payer: Self-pay | Admitting: Family Medicine

## 2018-08-22 ENCOUNTER — Other Ambulatory Visit: Payer: Self-pay | Admitting: Family Medicine

## 2018-09-03 ENCOUNTER — Other Ambulatory Visit (HOSPITAL_COMMUNITY)
Admission: RE | Admit: 2018-09-03 | Discharge: 2018-09-03 | Disposition: A | Discharge status: Home / Self Care | Payer: 59 | Source: Ambulatory Visit | Attending: Family Medicine | Admitting: Family Medicine

## 2018-09-03 ENCOUNTER — Ambulatory Visit (INDEPENDENT_AMBULATORY_CARE_PROVIDER_SITE_OTHER): Payer: 59 | Admitting: Family Medicine

## 2018-09-03 VITALS — BP 110/82 | HR 72 | Temp 97.7°F | Resp 18 | Ht 61.0 in | Wt 169.0 lb

## 2018-09-03 DIAGNOSIS — E782 Mixed hyperlipidemia: Secondary | ICD-10-CM

## 2018-09-03 DIAGNOSIS — E213 Hyperparathyroidism, unspecified: Secondary | ICD-10-CM | POA: Diagnosis not present

## 2018-09-03 DIAGNOSIS — M8580 Other specified disorders of bone density and structure, unspecified site: Secondary | ICD-10-CM | POA: Diagnosis not present

## 2018-09-03 DIAGNOSIS — Z124 Encounter for screening for malignant neoplasm of cervix: Secondary | ICD-10-CM | POA: Diagnosis not present

## 2018-09-03 DIAGNOSIS — E559 Vitamin D deficiency, unspecified: Secondary | ICD-10-CM

## 2018-09-03 DIAGNOSIS — E663 Overweight: Secondary | ICD-10-CM | POA: Diagnosis not present

## 2018-09-03 DIAGNOSIS — D126 Benign neoplasm of colon, unspecified: Secondary | ICD-10-CM

## 2018-09-03 DIAGNOSIS — Z23 Encounter for immunization: Secondary | ICD-10-CM

## 2018-09-03 NOTE — Assessment & Plan Note (Signed)
Pap today, no concerns on exam.  

## 2018-09-03 NOTE — Assessment & Plan Note (Signed)
Encouraged to get adequate exercise, calcium and vitamin d intake 

## 2018-09-03 NOTE — Assessment & Plan Note (Signed)
Encouraged heart healthy diet, increase exercise, avoid trans fats, consider a krill oil cap daily 

## 2018-09-03 NOTE — Assessment & Plan Note (Signed)
Encouraged DASH diet, decrease po intake and increase exercise as tolerated. Needs 7-8 hours of sleep nightly. Avoid trans fats, eat small, frequent meals every 4-5 hours with lean proteins, complex carbs and healthy fats. Minimize simple carbs 

## 2018-09-03 NOTE — Assessment & Plan Note (Signed)
Supplement and monitor 

## 2018-09-03 NOTE — Patient Instructions (Signed)
Shingrix is the new shingles shot, 2 shots over 2-6 months call insurance and confirm coverage and document then can call for nurse appt to get started  Hypercalcemia Hypercalcemia is having too much calcium in the blood. The body needs calcium to make bones and keep them strong. Calcium also helps the muscles, nerves, brain, and heart work the way they should. Most of the calcium in the body is in the bones. There is also some calcium in the blood. Hypercalcemia can happen when calcium comes out of the bones, or when the kidneys are not able to remove calcium from the blood. Hypercalcemia can be mild or severe. What are the causes? There are many possible causes of hypercalcemia. Common causes include:  Hyperparathyroidism. This is a condition in which the body produces too much parathyroid hormone. There are four parathyroid glands in your neck. These glands produce a chemical messenger (hormone) that helps the body absorb calcium from foods and helps your bones release calcium.  Certain kinds of cancer, such as lung cancer, breast cancer, or myeloma. Less common causes of hypercalcemia include:  Getting too much calcium or vitamin D from your diet.  Kidney failure.  Hyperthyroidism.  Being on bed rest for a long time.  Certain medicines.  Infections.  Sarcoidosis. What increases the risk? This condition is more likely to develop in:  Women.  People who are 60 years or older.  People who have a family history of hypercalcemia. What are the signs or symptoms? Mild hypercalcemia that starts slowly may not cause symptoms. Severe, sudden hypercalcemia is more likely to cause symptoms, such as:  Loss of appetite.  Increased thirst and frequent urination.  Fatigue.  Nausea and vomiting.  Headache.  Abdominal pain.  Muscle pain, twitching, or weakness.  Constipation.  Blood in the urine.  Pain in the side of the back (flank pain).  Anxiety, confusion, or  depression.  Irregular heartbeat (arrhythmia).  Loss of consciousness. How is this diagnosed? This condition may be diagnosed based on:  Your symptoms.  Blood tests.  Urine tests.  X-rays.  Ultrasound.  MRI.  CT scan. How is this treated? Treatment for hypercalcemia depends on the cause. Treatment may include:  Receiving fluids through an IV tube.  Medicines that keep calcium levels steady after receiving fluids (loop diuretics).  Medicines that keep calcium in your bones (bisphosphonates).  Medicines that lower the calcium level in your blood.  Surgery to remove overactive parathyroid glands. Follow these instructions at home:  Take over-the-counter and prescription medicines only as told by your health care provider.  Follow instructions from your health care provider about eating or drinking restrictions.  Drink enough fluid to keep your urine clear or pale yellow.  Stay active. Weight-bearing exercise helps to keep calcium in your bones. Follow instructions from your health care provider about what type and level of exercise is safe for you.  Keep all follow-up visits as told by your health care provider. This is important. Contact a health care provider if:  You have a fever.  You have flank or abdominal pain that is getting worse. Get help right away if:  You have severe abdominal or flank pain.  You have chest pain.  You have trouble breathing.  You become very confused and sleepy.  You lose consciousness. This information is not intended to replace advice given to you by your health care provider. Make sure you discuss any questions you have with your health care provider. Document Released: 09/30/2004  Document Revised: 12/23/2015 Document Reviewed: 12/02/2014 Elsevier Interactive Patient Education  [employer].

## 2018-09-03 NOTE — Assessment & Plan Note (Signed)
Check PTH asymptomatic

## 2018-09-04 LAB — CBC
HCT: 45.4 % (ref 36.0–46.0)
Hemoglobin: 15.4 g/dL — ABNORMAL HIGH (ref 12.0–15.0)
MCHC: 34 g/dL (ref 30.0–36.0)
MCV: 92.1 fl (ref 78.0–100.0)
Platelets: 154 10*3/uL (ref 150.0–400.0)
RBC: 4.93 Mil/uL (ref 3.87–5.11)
RDW: 13.9 % (ref 11.5–15.5)
WBC: 4.6 10*3/uL (ref 4.0–10.5)

## 2018-09-04 LAB — COMPREHENSIVE METABOLIC PANEL
ALK PHOS: 124 U/L — AB (ref 39–117)
ALT: 21 U/L (ref 0–35)
AST: 26 U/L (ref 0–37)
Albumin: 4.4 g/dL (ref 3.5–5.2)
BILIRUBIN TOTAL: 0.6 mg/dL (ref 0.2–1.2)
BUN: 30 mg/dL — ABNORMAL HIGH (ref 6–23)
CALCIUM: 10.8 mg/dL — AB (ref 8.4–10.5)
CO2: 23 meq/L (ref 19–32)
Chloride: 105 mEq/L (ref 96–112)
Creatinine, Ser: 1.12 mg/dL (ref 0.40–1.20)
GFR: 50.43 mL/min — AB (ref >60.00)
Glucose, Bld: 90 mg/dL (ref 70–99)
POTASSIUM: 4.3 meq/L (ref 3.5–5.1)
Sodium: 140 mEq/L (ref 135–145)
TOTAL PROTEIN: 7 g/dL (ref 6.0–8.3)

## 2018-09-04 LAB — LIPID PANEL
Cholesterol: 239 mg/dL — ABNORMAL HIGH (ref 0–200)
HDL: 110.9 mg/dL (ref >39.00)
LDL Cholesterol: 108 mg/dL — ABNORMAL HIGH (ref 0–99)
NonHDL: 127.99
TRIGLYCERIDES: 100 mg/dL (ref 0.0–149.0)
Total CHOL/HDL Ratio: 2
VLDL: 20 mg/dL (ref 0.0–40.0)

## 2018-09-04 LAB — TSH: TSH: 2.34 u[IU]/mL (ref 0.35–4.50)

## 2018-09-04 LAB — PARATHYROID HORMONE, INTACT (NO CA): PTH: 226 pg/mL — ABNORMAL HIGH (ref 14–64)

## 2018-09-04 LAB — VITAMIN D 25 HYDROXY (VIT D DEFICIENCY, FRACTURES): VITD: 26.19 ng/mL — ABNORMAL LOW (ref 30.00–100.00)

## 2018-09-05 LAB — CYTOLOGY - PAP
Adequacy: ABSENT
Diagnosis: NEGATIVE

## 2018-09-08 NOTE — Progress Notes (Signed)
Subjective:    Patient ID: Sheryl [name], female    DOB: 1960-12-11, 58 y.o.   MRN: [id number]  No chief complaint on file.   HPI Patient is in today for follow up and gynecologic exam, her screening pap smear is due. No discharge or female concerns. No recent cahnge in partners or fevers or chills. No recent febrile illness or hospitalizations. Denies CP/palp/SOB/HA/congestion/fevers/GI or GU c/o. Taking meds as prescribed  Past Medical History:  Diagnosis Date  . Anemia   . Anxiety   . Anxiety and depression    Follows with Dr Gala Murdoch q 6 months Has taken Zoloft in past without good results   . Chicken pox as a child  . Depression   . Elevated BP 02/06/2012  . FH: colon cancer 06/07/2011  . GERD (gastroesophageal reflux disease)   . 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 Hyperlipidemia 05/08/2012  . Osteopenia 02/27/2017  . Overweight(278.02)   . Preventative health care 06/07/2011  . Sacral pain 05/08/2012  . Tachycardia 11/10/2014  . Vitamin D deficiency 05/09/2014    Past Surgical History:  Procedure Laterality Date  . [place]   X 2  . COLONOSCOPY    . GUM SURGERY  2007  . right hand surgery  2009   infection debrided    Family History  Problem Relation Age of Onset  . Heart attack Mother   . Diabetes Mother        type 2  . Hypertension Mother   . Hyperlipidemia Mother   . Heart disease Mother   . Cancer Father        colon  . Heart attack Father   . Colon cancer Father   . Hypertension Sister   . Diabetes Sister        type 2  . Hypertension Sister   . Diabetes Maternal Grandmother   . Hypertension Maternal Grandmother   . Hyperlipidemia Maternal Grandmother     Social History   Socioeconomic History  . Marital status: Married    Spouse name: Not on file  . Number of children: Not on file  . Years of education: Not on file  . Highest education level: Not on file  Occupational History  . Not on file    Social Needs  . Financial resource strain: Not on file  . Food insecurity:    Worry: Not on file    Inability: Not on file  . Transportation needs:    Medical: Not on file    Non-medical: Not on file  Tobacco Use  . Smoking status: Never Smoker  . Smokeless tobacco: Never Used  Substance and Sexual Activity  . Alcohol use: Yes    Alcohol/week: 35.0 standard drinks    Types: 21 Glasses of wine, 14 Standard drinks or equivalent per week    Comment: glass of wine nightly  . Drug use: No  . Sexual activity: Yes    Partners: Male    Comment: no dietary restrictions, lives with husband  Lifestyle  . Physical activity:    Days per week: Not on file    Minutes per session: Not on file  . Stress: Not on file  Relationships  . Social connections:    Talks on phone: Not on file    Gets together: Not on file    Attends religious service: Not on file    Active member of club or organization: Not on file  Attends meetings of clubs or organizations: Not on file    Relationship status: Not on file  . Intimate partner violence:    Fear of current or ex partner: Not on file    Emotionally abused: Not on file    Physically abused: Not on file    Forced sexual activity: Not on file  Other Topics Concern  . Not on file  Social History Narrative  . Not on file    Outpatient Medications Prior to Visit  Medication Sig Dispense Refill  . Coenzyme Q10 (CO Q 10 PO) Take 1 capsule by mouth daily.    . ergocalciferol (DRISDOL) 50000 units capsule Take 1 capsule (50,000 Units total) by mouth once a week. 4 capsule 4  . Krill Oil CAPS 1 cap daily MegaRed by Schiff    . metoprolol succinate (TOPROL-XL) 50 MG 24 hr tablet TAKE 1 TABLET BY MOUTH DAILY WITH OR IMMEDIATELY FOLLOWING A MEAL 90 tablet 1  . Multiple Vitamin (MULTIVITAMIN) capsule Take 1 capsule by mouth daily.      . pantoprazole (PROTONIX) 40 MG tablet TAKE 1 TABLET (40 MG TOTAL) BY MOUTH DAILY AS NEEDED. 90 tablet 1  .  phenazopyridine (PYRIDIUM) 200 MG tablet Take 1 tablet (200 mg total) by mouth 3 (three) times daily as needed for pain. 10 tablet 0  . sertraline (ZOLOFT) 100 MG tablet TAKE 1.5 TABLETS (150 MG TOTAL) BY MOUTH DAILY. 135 tablet 1  . sulfamethoxazole-trimethoprim (BACTRIM DS,SEPTRA DS) 800-160 MG tablet Take 1 tablet by mouth 2 (two) times daily. 14 tablet 0   No facility-administered medications prior to visit.     Allergies  Allergen Reactions  . Penicillins Rash    Review of Systems  Constitutional: Negative for fever and malaise/fatigue.  HENT: Negative for congestion.   Eyes: Negative for blurred vision.  Respiratory: Negative for shortness of breath.   Cardiovascular: Negative for chest pain, palpitations and leg swelling.  Gastrointestinal: Negative for abdominal pain, blood in stool and nausea.  Genitourinary: Negative for dysuria and frequency.  Musculoskeletal: Negative for falls.  Skin: Negative for rash.  Neurological: Negative for dizziness, loss of consciousness and headaches.  Endo/Heme/Allergies: Negative for environmental allergies.  Psychiatric/Behavioral: Negative for depression. The patient is not nervous/anxious.        Objective:    Physical Exam Constitutional:      General: She is not in acute distress.    Appearance: She is well-developed.  HENT:     Head: Normocephalic and atraumatic.  Eyes:     Conjunctiva/sclera: Conjunctivae normal.  Neck:     Musculoskeletal: Neck supple. No neck rigidity or muscular tenderness.     Thyroid: No thyromegaly.  Cardiovascular:     Rate and Rhythm: Normal rate and regular rhythm.     Heart sounds: Normal heart sounds. No murmur.  Pulmonary:     Effort: Pulmonary effort is normal. No respiratory distress.     Breath sounds: Normal breath sounds.  Abdominal:     General: Bowel sounds are normal. There is no distension.     Palpations: Abdomen is soft. There is no mass.     Tenderness: There is no abdominal  tenderness.  Lymphadenopathy:     Cervical: No cervical adenopathy.  Skin:    General: Skin is warm and dry.  Neurological:     Mental Status: She is alert and oriented to person, place, and time.  Psychiatric:        Behavior: Behavior normal.  BP 110/82 (BP Location: Left Arm, Patient Position: Sitting, Cuff Size: Normal)   Pulse 72   Temp 97.7 F (36.5 C) (Oral)   Resp 18   Ht 5\' 1"  (1.549 m)   Wt 169 lb (76.7 kg)   LMP 09/08/2011   SpO2 97%   BMI 31.93 kg/m  Wt Readings from Last 3 Encounters:  09/03/18 169 lb (76.7 kg)  02/28/18 167 lb (75.8 kg)  02/26/18 169 lb 3.2 oz (76.7 kg)     Lab Results  Component Value Date   WBC 4.6 09/03/2018   HGB 15.4 (H) 09/03/2018   HCT 45.4 09/03/2018   PLT 154.0 09/03/2018   GLUCOSE 90 09/03/2018   CHOL 239 (H) 09/03/2018   TRIG 100.0 09/03/2018   HDL 110.90 09/03/2018   LDLDIRECT 103.8 01/10/2013   LDLCALC 108 (H) 09/03/2018   ALT 21 09/03/2018   AST 26 09/03/2018   NA 140 09/03/2018   K 4.3 09/03/2018   CL 105 09/03/2018   CREATININE 1.12 09/03/2018   BUN 30 (H) 09/03/2018   CO2 23 09/03/2018   TSH 2.34 09/03/2018   HGBA1C 5.0 12/07/2014    Lab Results  Component Value Date   TSH 2.34 09/03/2018   Lab Results  Component Value Date   WBC 4.6 09/03/2018   HGB 15.4 (H) 09/03/2018   HCT 45.4 09/03/2018   MCV 92.1 09/03/2018   PLT 154.0 09/03/2018   Lab Results  Component Value Date   NA 140 09/03/2018   K 4.3 09/03/2018   CO2 23 09/03/2018   GLUCOSE 90 09/03/2018   BUN 30 (H) 09/03/2018   CREATININE 1.12 09/03/2018   BILITOT 0.6 09/03/2018   ALKPHOS 124 (H) 09/03/2018   AST 26 09/03/2018   ALT 21 09/03/2018   PROT 7.0 09/03/2018   ALBUMIN 4.4 09/03/2018   CALCIUM 10.8 (H) 09/03/2018   ANIONGAP 7 11/10/2014   GFR 50.43 (L) 09/03/2018   Lab Results  Component Value Date   CHOL 239 (H) 09/03/2018   Lab Results  Component Value Date   HDL 110.90 09/03/2018   Lab Results  Component Value  Date   LDLCALC 108 (H) 09/03/2018   Lab Results  Component Value Date   TRIG 100.0 09/03/2018   Lab Results  Component Value Date   CHOLHDL 2 09/03/2018   Lab Results  Component Value Date   HGBA1C 5.0 12/07/2014       Assessment & Plan:   Problem List Items Addressed This Visit    Overweight    Encouraged DASH diet, decrease po intake and increase exercise as tolerated. Needs 7-8 hours of sleep nightly. Avoid trans fats, eat small, frequent meals every 4-5 hours with lean proteins, complex carbs and healthy fats. Minimize simple carbs      Relevant Orders   TSH (Completed)   Cervical cancer screening    Pap today, no concerns on exam.       Relevant Orders   Cytology - PAP( [place]) (Completed)   Hyperlipidemia    Encouraged heart healthy diet, increase exercise, avoid trans fats, consider a krill oil cap daily      Relevant Orders   Comprehensive metabolic panel (Completed)   Lipid panel (Completed)   TSH (Completed)   Vitamin D deficiency    Supplement and monitor      Relevant Orders   Comprehensive metabolic panel (Completed)   VITAMIN D 25 Hydroxy (Vit-D Deficiency, Fractures) (Completed)   Polyp of colon - Primary  Relevant Orders   Ambulatory referral to Gastroenterology   CBC (Completed)   Osteopenia    Encouraged to get adequate exercise, calcium and vitamin d intake      Relevant Orders   TSH (Completed)   Parathyroid hormone excess (HCC)    Check PTH asymptomatic      Relevant Orders   Comprehensive metabolic panel (Completed)   PTH, intact (no Ca) (Completed)      I have discontinued Mills Koller Mckelvy "Kim"'s sulfamethoxazole-trimethoprim. I am also having her maintain her multivitamin, Krill Oil, Coenzyme Q10 (CO Q 10 PO), pantoprazole, ergocalciferol, metoprolol succinate, phenazopyridine, and sertraline.  No orders of the defined types were placed in this encounter.    Penni Homans, MD

## 2018-09-10 DIAGNOSIS — E349 Endocrine disorder, unspecified: Secondary | ICD-10-CM

## 2018-09-18 ENCOUNTER — Ambulatory Visit: Payer: 59 | Admitting: Internal Medicine

## 2018-09-18 ENCOUNTER — Encounter: Payer: Self-pay | Admitting: Internal Medicine

## 2018-09-18 VITALS — BP 114/78 | HR 80 | Resp 16 | Ht 61.0 in | Wt 167.8 lb

## 2018-09-18 DIAGNOSIS — E213 Hyperparathyroidism, unspecified: Secondary | ICD-10-CM

## 2018-09-18 DIAGNOSIS — E559 Vitamin D deficiency, unspecified: Secondary | ICD-10-CM | POA: Diagnosis not present

## 2018-09-18 NOTE — Progress Notes (Signed)
Name: Sheryl [name]  MRN/ DOB: [id number], [birth year]    Age/ Sex: 58 y.o., female    PCP: Mosie Lukes, MD   Reason for Endocrinology Evaluation: Hyperparathyroidism     Date of Initial Endocrinology Evaluation: 09/18/2018     HPI: Sheryl [name] is a 58 y.o. female with a past medical history of hyperparathyroidism . The patient presented for initial endocrinology clinic visit on 09/18/2018 for consultative assistance with her hyperparathyroidism.   Ms. Pebley indicates that she was first diagnosed with hypercalcemia in 2013, at which time it was found on routine work up. She used to see Dr. Dwyane Dee but was lost to follow up since 2016. Since that time, she denies experienced symptoms of constipation, polyuria, polydipsia, generalized weakness, diffuse muscle pains, significant memory impairment. She denies use of over the counter calcium (including supplements, Tums, Rolaids, or other calcium containing antacids), lithium, HCTZ,   She uses vitamin D supplements 1000 iu daily   She denies history of kidney stones, kidney disease, liver disease, granulomatous disease. She denies osteoporosis or prior fractures. Daily dietary calcium intake: 1 servings . She denies family history of osteoporosis, parathyroid disease, thyroid disease.    HISTORY:  Past Medical History:  Past Medical History:  Diagnosis Date  . Anemia   . Anxiety   . Anxiety and depression    Follows with Dr Gala Murdoch q 6 months Has taken Zoloft in past without good results   . Chicken pox as a child  . Depression   . Elevated BP 02/06/2012  . FH: colon cancer 06/07/2011  . GERD (gastroesophageal reflux disease)   . 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 Hyperlipidemia 05/08/2012  . Osteopenia 02/27/2017  . Overweight(278.02)   . Preventative health care 06/07/2011  . Sacral pain 05/08/2012  . Tachycardia 11/10/2014  . Vitamin D deficiency 05/09/2014    Past Surgical History:  Past  Surgical History:  Procedure Laterality Date  . [place]   X 2  . COLONOSCOPY    . GUM SURGERY  2007  . right hand surgery  2009   infection debrided      Social History:  reports that she has never smoked. She has never used smokeless tobacco. She reports current alcohol use of about 35.0 standard drinks of alcohol per week. She reports that she does not use drugs.  Family History: family history includes Cancer in her father; Colon cancer in her father; Diabetes in her maternal grandmother, mother, and sister; Heart attack in her father and mother; Heart disease in her mother; Hyperlipidemia in her maternal grandmother and mother; Hypertension in her maternal grandmother, mother, sister, and sister.   HOME MEDICATIONS: Allergies as of 09/18/2018      Reactions   Penicillins Rash      Medication List       Accurate as of September 18, 2018 12:40 PM. Always use your most recent med list.        CO Q 10 PO Take 1 capsule by mouth daily.   ergocalciferol 1.25 MG (50000 UT) capsule Commonly known as:  DRISDOL Take 1 capsule (50,000 Units total) by mouth once a week.   Krill Oil Caps 1 cap daily MegaRed by Schiff   metoprolol succinate 50 MG 24 hr tablet Commonly known as:  TOPROL-XL TAKE 1 TABLET BY MOUTH DAILY WITH OR IMMEDIATELY FOLLOWING A MEAL   multivitamin capsule Take 1 capsule by mouth daily.  pantoprazole 40 MG tablet Commonly known as:  PROTONIX TAKE 1 TABLET (40 MG TOTAL) BY MOUTH DAILY AS NEEDED.   phenazopyridine 200 MG tablet Commonly known as:  PYRIDIUM Take 1 tablet (200 mg total) by mouth 3 (three) times daily as needed for pain.   sertraline 100 MG tablet Commonly known as:  ZOLOFT TAKE 1.5 TABLETS (150 MG TOTAL) BY MOUTH DAILY.         REVIEW OF SYSTEMS: A comprehensive ROS was conducted with the patient and is negative except as per HPI and below:  ROS     OBJECTIVE:  VS: LMP 09/08/2011    Wt Readings from  Last 3 Encounters:  09/03/18 169 lb (76.7 kg)  02/28/18 167 lb (75.8 kg)  02/26/18 169 lb 3.2 oz (76.7 kg)     EXAM: General: Pt appears well and is in NAD  Hydration: Well-hydrated with moist mucous membranes and good skin turgor  Eyes: External eye exam normal without stare, lid lag or exophthalmos.  EOM intact.  PERRL.  Ears, Nose, Throat: Hearing: Grossly intact bilaterally Dental: Good dentition  Throat: Clear without mass, erythema or exudate  Neck: General: Supple without adenopathy. Thyroid: Thyroid size normal.  No goiter or nodules appreciated. No thyroid bruit.  Lungs: Clear with good BS bilat with no rales, rhonchi, or wheezes  Heart: Auscultation: RRR.  Abdomen: Normoactive bowel sounds, soft, nontender, without masses or organomegaly palpable  Extremities:  BL LE: No pretibial edema normal ROM and strength.  Skin: Hair: Texture and amount normal with gender appropriate distribution Skin Inspection: No rashes. Skin Palpation: Skin temperature, texture, and thickness normal to palpation  Neuro: Cranial nerves: II - XII grossly intact  Motor: Normal strength throughout DTRs: 2+ and symmetric in UE without delay in relaxation phase  Mental Status: Judgment, insight: Intact Orientation: Oriented to time, place, and person Mood and affect: No depression, anxiety, or agitation     DATA REVIEWED: Results for QUINTESSA, SIMMERMAN (MRN [id number]) as of 09/18/2018 12:41  Ref. Range 12/07/2014 08:43 10/11/2015 08:37 09/15/2016 17:10 02/27/2017 14:15 08/30/2017 13:57 02/28/2018 16:13 09/03/2018 15:18  PTH, Intact Latest Ref Range: 14 - 64 pg/mL 178 (H)  134 (H) 193 (H) 148 (H) 229 (H) 226 (H)  TSH Latest Ref Range: 0.35 - 4.50 uIU/mL  2.22 2.20 3.30 2.73 3.78 2.34    Results for HALEN, MOSSBARGER (MRN [id number]) as of 09/18/2018 16:01  Ref. Range 09/03/2018 15:18  VITD Latest Ref Range: 30.00 - 100.00 ng/mL 26.19 (L)   CMP     Component Value Date/Time   NA 140 09/03/2018  1518   K 4.3 09/03/2018 1518   CL 105 09/03/2018 1518   CO2 23 09/03/2018 1518   GLUCOSE 90 09/03/2018 1518   BUN 30 (H) 09/03/2018 1518   CREATININE 1.12 09/03/2018 1518   CREATININE 1.09 (H) 09/15/2016 1710   CALCIUM 10.8 (H) 09/03/2018 1518   PROT 7.0 09/03/2018 1518   ALBUMIN 4.4 09/03/2018 1518   AST 26 09/03/2018 1518   ALT 21 09/03/2018 1518   ALKPHOS 124 (H) 09/03/2018 1518   BILITOT 0.6 09/03/2018 1518   GFRNONAA 72 (L) 11/10/2014 1035   GFRAA 83 (L) 11/10/2014 1035     ASSESSMENT/PLAN/RECOMMENDATIONS:   1. Hyperparathyroidism:  -Patient is asymptomatic. -We discussed the differential diagnosis of familial hypocalciuric hypercalcemia ([place]) versus primary hyperparathyroidism (pHPT) -I explained to the patient that we will need to perform 24-hour urine collection to check the calcium creatinine ratio, but she  will need to be vitamin D repleted.  -If she has  [place] no further treatment or work-up is needed, but if she does have primary hyper parathyroidism the patient will need further evaluation to determine surgical candidacy. - Criteria for surgical intervention (Serum calcium concentration of 1.0 mg/dL or more above the upper limit of normal,Estimated glomerular filtration rate (eGFR) <60 mL/min, evidence of Osteoporosis on bone density, Twenty-four-hour urinary calcium >300 mg/day ,Nephrolithiasis or nephrocalcinosis by radiograph, Age less than 50 years.) - Encouraged hydration  - AVOID CALCIUM SUPPLEMENTS, AVOID LOW CALCIUM DIET - Maintain normal dietary calcium intake (2-3 servings of dairy a day)   2.  Vitamin D insufficiency: -Patient is to be on 50,000 IU vitamin D weekly, patient admits to noncompliance, less than 2 weeks ago she was started on vitamin D3 OTC 1000 IU daily   Medications Increase OTC vitamin D3 to 2000 IU daily    Follow-up in 6 weeks  Signed electronically by: Mack Guise, MD  [facility] Endocrinology  [address] [place], [place] [address] Phone: [phone] FAX: [phone]   CC: Mosie Lukes, [address] [place] [address] Phone: [phone] Fax: [phone]   Return to Endocrinology clinic as below: Future Appointments  Date Time Provider [place]  09/18/2018  2:20 PM Judithann Villamar, Melanie Crazier, MD [facility] None  03/04/2019  1:40 PM Mosie Lukes, MD [facility]

## 2018-09-18 NOTE — Patient Instructions (Addendum)
-   Please make sure you stay hydrated 7-8 glasses of water a day  - AVOID CALCIUM SUPPLEMENTS, AVOID LOW CALCIUM DIET - Maintain normal dietary calcium intake between 1000-1200 mg a day  - Start taking 2000 units of Vitamin D daily       FOODS THAT CONTAIN CALCIUM  Calcium can be found in many foods, not only in dairy products.  Dairy Foods  Yogurt (1 cup) 350 mg  Milk (1 cup) 300 mg  Cheddar cheese (1 oz.) 204 mg  Ricotta cheese, part skim (1/4 cup) 169 mg  Cottage cheese (1 cup) 150 mg  Nondairy Foods  Whole Grain Total cereal (3/4 cup) 1000 mg  Pink salmon with bones, sardines (3 oz., cooked) 181 mg  Black beans (1 cup) 103 mg  Broccoli (1 cup, cooked) 150 mg  Almonds (1 tbsp.) 50 mg  Soy Products  Soy yogurt with calcium (3/4 cup) 300 mg  Soy milk enriched with calcium (1 cup) 300 mg  Tofu, firm or extra firm (1/4 cup) 250 mg  Soy nuts, roasted/salted (1/2 cup) 103 mg

## 2018-09-22 ENCOUNTER — Other Ambulatory Visit: Payer: Self-pay | Admitting: Family Medicine

## 2018-10-01 ENCOUNTER — Encounter: Payer: Self-pay | Admitting: Internal Medicine

## 2018-10-18 ENCOUNTER — Encounter: Payer: 59 | Admitting: Internal Medicine

## 2018-10-24 ENCOUNTER — Other Ambulatory Visit: Payer: Self-pay | Admitting: Family Medicine

## 2018-10-30 ENCOUNTER — Ambulatory Visit: Payer: 59 | Admitting: Internal Medicine

## 2018-12-10 ENCOUNTER — Telehealth: Payer: Self-pay

## 2018-12-10 NOTE — Telephone Encounter (Signed)
Left message for patient to call back to change appointment to a virtual visit.  CRM created.

## 2018-12-11 ENCOUNTER — Ambulatory Visit: Payer: 59 | Admitting: Internal Medicine

## 2018-12-11 ENCOUNTER — Encounter: Payer: Self-pay | Admitting: Internal Medicine

## 2018-12-11 ENCOUNTER — Other Ambulatory Visit: Payer: Self-pay

## 2018-12-11 VITALS — BP 118/84 | HR 113 | Temp 98.1°F | Wt 170.0 lb

## 2018-12-11 DIAGNOSIS — E213 Hyperparathyroidism, unspecified: Secondary | ICD-10-CM | POA: Diagnosis not present

## 2018-12-11 LAB — BASIC METABOLIC PANEL
BUN: 19 mg/dL (ref 6–23)
CO2: 28 mEq/L (ref 19–32)
Calcium: 11.2 mg/dL — ABNORMAL HIGH (ref 8.4–10.5)
Chloride: 106 mEq/L (ref 96–112)
Creatinine, Ser: 0.92 mg/dL (ref 0.40–1.20)
GFR: 63.23 mL/min (ref >60.00)
Glucose, Bld: 111 mg/dL — ABNORMAL HIGH (ref 70–99)
Potassium: 4.6 mEq/L (ref 3.5–5.1)
Sodium: 139 mEq/L (ref 135–145)

## 2018-12-11 LAB — ALBUMIN: Albumin: 4.3 g/dL (ref 3.5–5.2)

## 2018-12-11 LAB — VITAMIN D 25 HYDROXY (VIT D DEFICIENCY, FRACTURES): VITD: 24.69 ng/mL — ABNORMAL LOW (ref 30.00–100.00)

## 2018-12-11 NOTE — Progress Notes (Signed)
Name: Sheryl [name]  MRN/ DOB: [id number], 20-Jul-1961    Age/ Sex: 58 y.o., female     PCP: Mosie Lukes, MD   Reason for Endocrinology Evaluation: Hyperparathyroidism      Initial Endocrinology Clinic Visit: 09/18/2018    PATIENT IDENTIFIER: Sheryl [name] is a 58 y.o., female with a past medical history of hyperparathyroidism. She has followed with [facility] since 09/18/2018 for consultative assistance with management of her hyperparathyroidism.   HISTORICAL SUMMARY: The patient was first diagnosed with hypercalcemia in 2013, at which time it was found on routine work up . She used to see Dr. Dwyane Dee but was lost to follow up from 2016 until re-establishing at our office in 08/2018.   SUBJECTIVE:   During last visit (09/18/2018): She was asymptomatic . Labs stable, increase Vitamin D to 2000 iu daily   Today (12/11/2018):  Ms. Sampsel is here for a 3 month follow up on hyperparathyroidism. She denies any polyuria or polydipsia, she has been feeling more tied lately. She has been hydrating well, she is consuming 2-3 servings of dairy a day.  She had no renal stones since her last visit with [place].     ROS:  As per HPI.   HISTORY:  Past Medical History:  Past Medical History:  Diagnosis Date  . Anemia   . Anxiety   . Anxiety and depression    Follows with Dr Gala Murdoch q 6 months Has taken Zoloft in past without good results   . Chicken pox as a child  . Depression   . Elevated BP 02/06/2012  . FH: colon cancer 06/07/2011  . GERD (gastroesophageal reflux disease)   . 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 Hyperlipidemia 05/08/2012  . Osteopenia 02/27/2017  . Overweight(278.02)   . Preventative health care 06/07/2011  . Sacral pain 05/08/2012  . Tachycardia 11/10/2014  . Vitamin D deficiency 05/09/2014   Past Surgical History:  Past Surgical History:  Procedure Laterality Date  . [place]   X 2  . COLONOSCOPY     . GUM SURGERY  2007  . right hand surgery  2009   infection debrided    Social History:  reports that she has never smoked. She has never used smokeless tobacco. She reports current alcohol use of about 35.0 standard drinks of alcohol per week. She reports that she does not use drugs. Family History:  Family History  Problem Relation Age of Onset  . Heart attack Mother   . Diabetes Mother        type 2  . Hypertension Mother   . Hyperlipidemia Mother   . Heart disease Mother   . Cancer Father        colon  . Heart attack Father   . Colon cancer Father   . Hypertension Sister   . Diabetes Sister        type 2  . Hypertension Sister   . Diabetes Maternal Grandmother   . Hypertension Maternal Grandmother   . Hyperlipidemia Maternal Grandmother      HOME MEDICATIONS: Allergies as of 12/11/2018      Reactions   Penicillins Rash      Medication List       Accurate as of Dec 11, 2018  3:20 PM. If you have any questions, ask your nurse or doctor.        CO Q 10 PO Take 1 capsule by mouth daily.  ergocalciferol 1.25 MG (50000 UT) capsule Commonly known as:  Drisdol Take 1 capsule (50,000 Units total) by mouth once a week.   metoprolol succinate 50 MG 24 hr tablet Commonly known as:  TOPROL-XL TAKE 1 TABLET BY MOUTH DAILY WITH OR IMMEDIATELY FOLLOWING A MEAL   multivitamin capsule Take 1 capsule by mouth daily.   pantoprazole 40 MG tablet Commonly known as:  PROTONIX TAKE 1 TABLET (40 MG TOTAL) BY MOUTH DAILY AS NEEDED.   sertraline 100 MG tablet Commonly known as:  ZOLOFT TAKE 1.5 TABLETS (150 MG TOTAL) BY MOUTH DAILY.         OBJECTIVE:   PHYSICAL EXAM: VS: BP 118/84 (BP Location: Right Arm, Patient Position: Sitting, Cuff Size: Normal)   Pulse (!) 113   Temp 98.1 F (36.7 C) (Oral)   Wt 170 lb (77.1 kg)   LMP 09/08/2011   SpO2 98%   BMI 32.12 kg/m    EXAM: General: Pt appears well and is in NAD  Neck: General: Supple without adenopathy.  Thyroid: Thyroid size normal.  No goiter or nodules appreciated. No thyroid bruit.  Lungs: Clear with good BS bilat with no rales, rhonchi, or wheezes  Heart: Auscultation: RRR.  Abdomen: Normoactive bowel sounds, soft, nontender, without masses or organomegaly palpable  Extremities:  BL LE: No pretibial edema normal ROM and strength.  Skin: Hair: Texture and amount normal with gender appropriate distribution Skin Inspection: No rashes. Skin Palpation: Skin temperature, texture, and thickness normal to palpation  Neuro:  DTRs: 2+ and symmetric in UE without delay in relaxation phase  Mental Status: Judgment, insight: Intact Orientation: Oriented to time, place, and person Mood and affect: No depression, anxiety, or agitation     DATA REVIEWED:  Results for ANZLEY, DIBBERN (MRN [id number]) as of 12/12/2018 12:16  Ref. Range 12/11/2018 14:19 12/11/2018 14:19  Sodium Latest Ref Range: 135 - 145 mEq/L 139   Potassium Latest Ref Range: 3.5 - 5.1 mEq/L 4.6   Chloride Latest Ref Range: 96 - 112 mEq/L 106   CO2 Latest Ref Range: 19 - 32 mEq/L 28   Glucose Latest Ref Range: 70 - 99 mg/dL 111 (H)   BUN Latest Ref Range: 6 - 23 mg/dL 19   Creatinine Latest Ref Range: 0.40 - 1.20 mg/dL 0.92   Calcium Latest Ref Range: 8.6 - 10.4 mg/dL 11.2 (H) 11.1 (H)  Albumin Latest Ref Range: 3.5 - 5.2 g/dL 4.3   GFR Latest Ref Range: >60.00 mL/min 63.23   VITD Latest Ref Range: 30.00 - 100.00 ng/mL 24.69 (L)      ASSESSMENT / PLAN / RECOMMENDATIONS:    1. Hyperparathyroidism:  -Patient is asymptomatic. -We discussed the differential diagnosis of familial hypocalciuric hypercalcemia ([place]) versus primary hyperparathyroidism (pHPT) -I explained to the patient that we will need to perform 24-hour urine collection to check the calcium creatinine ratio. -If she has  [place] no further treatment or work-up is needed, but if she does have primary hyper parathyroidism the patient will need further  evaluation to determine surgical candidacy. - Criteria for surgical intervention (Serum calcium concentration of 1.0 mg/dL or more above the upper limit of normal,Estimated glomerular filtration rate (eGFR) <60 mL/min, evidence of Osteoporosis on bone density, Twenty-four-hour urinary calcium >300 mg/day ,Nephrolithiasis or nephrocalcinosis by radiograph, Age less than 50 years.) - Encouraged hydration  - AVOID CALCIUM SUPPLEMENTS, AVOID LOW CALCIUM DIET - Maintain normal dietary calcium intake (2-3 servings of dairy a day)   2.  Vitamin D  insufficiency: - She is supposed to be on Vitamin D 2000 iu daily , her levels are less this time, she is not sure if she has been missing doses.    F/u in 6 months   Signed electronically by: Mack Guise, MD  [facility] Endocrinology  [facility] [address] [place], [place] [address] Phone: [phone] FAX: [phone]      CC: Mosie Lukes, [address] [place] [address] Phone: [phone]  Fax: [phone]   Return to Endocrinology clinic as below: Future Appointments  Date Time Provider [place]  03/04/2019  1:40 PM Mosie Lukes, MD [facility]  06/11/2019  2:00 PM , Melanie Crazier, MD [facility] None

## 2018-12-11 NOTE — Patient Instructions (Signed)

## 2018-12-12 ENCOUNTER — Encounter: Payer: Self-pay | Admitting: Internal Medicine

## 2018-12-12 LAB — PTH, INTACT AND CALCIUM
Calcium: 11.1 mg/dL — ABNORMAL HIGH (ref 8.6–10.4)
PTH: 40 pg/mL (ref 14–64)

## 2018-12-17 ENCOUNTER — Other Ambulatory Visit: Payer: 59

## 2018-12-17 ENCOUNTER — Other Ambulatory Visit: Payer: Self-pay

## 2018-12-17 ENCOUNTER — Other Ambulatory Visit: Payer: Self-pay | Admitting: Internal Medicine

## 2018-12-17 DIAGNOSIS — E213 Hyperparathyroidism, unspecified: Secondary | ICD-10-CM

## 2019-01-03 ENCOUNTER — Other Ambulatory Visit: Payer: Self-pay

## 2019-01-03 ENCOUNTER — Other Ambulatory Visit: Payer: 59

## 2019-01-03 DIAGNOSIS — E213 Hyperparathyroidism, unspecified: Secondary | ICD-10-CM

## 2019-01-04 LAB — CREATININE, URINE, 24 HOUR: Creatinine, 24H Ur: 0.78 g/(24.h) (ref 0.50–2.15)

## 2019-01-04 LAB — EXTRA URINE SPECIMEN

## 2019-01-04 LAB — CALCIUM, URINE, 24 HOUR: Calcium, 24H Urine: 326 mg/24 h — ABNORMAL HIGH

## 2019-01-06 ENCOUNTER — Telehealth: Payer: Self-pay | Admitting: Internal Medicine

## 2019-01-06 DIAGNOSIS — E213 Hyperparathyroidism, unspecified: Secondary | ICD-10-CM

## 2019-01-06 NOTE — Telephone Encounter (Signed)
Discussed 24-hr urine collection results with the pt.   Pt meets surgical criteria for parathyroidectomy.    Pt agreed to proceeding with surgical referral  Will also proceed with parathyroid scan for localization  Abby Nena Jordan, MD  [facility] Endocrinology  [facility] [address] [place], [place] [address] Phone: [phone] FAX: [phone]

## 2019-01-09 NOTE — Telephone Encounter (Signed)
Referral faxed

## 2019-02-07 ENCOUNTER — Other Ambulatory Visit (HOSPITAL_COMMUNITY): Payer: Self-pay | Admitting: Surgery

## 2019-02-07 DIAGNOSIS — E210 Primary hyperparathyroidism: Secondary | ICD-10-CM

## 2019-02-14 ENCOUNTER — Encounter (HOSPITAL_COMMUNITY): Payer: 59

## 2019-02-26 ENCOUNTER — Ambulatory Visit (HOSPITAL_COMMUNITY)
Admission: RE | Admit: 2019-02-26 | Discharge: 2019-02-26 | Disposition: A | Discharge status: Home / Self Care | Payer: 59 | Source: Ambulatory Visit | Attending: Internal Medicine | Admitting: Internal Medicine

## 2019-02-26 ENCOUNTER — Other Ambulatory Visit: Payer: Self-pay

## 2019-02-26 ENCOUNTER — Encounter (HOSPITAL_COMMUNITY): Admission: RE | Admit: 2019-02-26 | Payer: 59 | Source: Ambulatory Visit

## 2019-02-26 ENCOUNTER — Encounter (HOSPITAL_COMMUNITY)
Admission: RE | Admit: 2019-02-26 | Discharge: 2019-02-26 | Disposition: A | Discharge status: Home / Self Care | Payer: 59 | Source: Ambulatory Visit | Attending: Surgery | Admitting: Surgery

## 2019-02-26 ENCOUNTER — Encounter (HOSPITAL_COMMUNITY): Payer: Self-pay

## 2019-02-26 ENCOUNTER — Ambulatory Visit (HOSPITAL_COMMUNITY)
Admission: RE | Admit: 2019-02-26 | Discharge: 2019-02-26 | Disposition: A | Discharge status: Home / Self Care | Payer: 59 | Source: Ambulatory Visit | Attending: Surgery | Admitting: Surgery

## 2019-02-26 DIAGNOSIS — E210 Primary hyperparathyroidism: Secondary | ICD-10-CM

## 2019-02-26 DIAGNOSIS — E213 Hyperparathyroidism, unspecified: Secondary | ICD-10-CM

## 2019-02-26 MED ORDER — TECHNETIUM TC 99M SESTAMIBI GENERIC - CARDIOLITE: 25.0000 | Freq: Once | INTRAVENOUS | Status: DC | PRN

## 2019-02-26 MED ORDER — TECHNETIUM TC 99M SESTAMIBI GENERIC - CARDIOLITE
10.0000 | Freq: Once | INTRAVENOUS | Status: AC | PRN
  Administered 2019-02-26: 10 via INTRAVENOUS

## 2019-03-04 ENCOUNTER — Encounter: Payer: 59 | Admitting: Family Medicine

## 2019-03-10 ENCOUNTER — Ambulatory Visit (INDEPENDENT_AMBULATORY_CARE_PROVIDER_SITE_OTHER): Payer: 59 | Admitting: Family Medicine

## 2019-03-10 ENCOUNTER — Other Ambulatory Visit: Payer: Self-pay

## 2019-03-10 ENCOUNTER — Encounter: Payer: Self-pay | Admitting: Family Medicine

## 2019-03-10 VITALS — BP 122/82 | HR 73 | Temp 98.0°F | Resp 18 | Ht 61.0 in | Wt 168.2 lb

## 2019-03-10 DIAGNOSIS — E559 Vitamin D deficiency, unspecified: Secondary | ICD-10-CM

## 2019-03-10 DIAGNOSIS — M8580 Other specified disorders of bone density and structure, unspecified site: Secondary | ICD-10-CM

## 2019-03-10 DIAGNOSIS — F329 Major depressive disorder, single episode, unspecified: Secondary | ICD-10-CM

## 2019-03-10 DIAGNOSIS — E213 Hyperparathyroidism, unspecified: Secondary | ICD-10-CM | POA: Diagnosis not present

## 2019-03-10 DIAGNOSIS — E782 Mixed hyperlipidemia: Secondary | ICD-10-CM

## 2019-03-10 DIAGNOSIS — K219 Gastro-esophageal reflux disease without esophagitis: Secondary | ICD-10-CM

## 2019-03-10 DIAGNOSIS — E663 Overweight: Secondary | ICD-10-CM

## 2019-03-10 DIAGNOSIS — Z0000 Encounter for general adult medical examination without abnormal findings: Secondary | ICD-10-CM

## 2019-03-10 DIAGNOSIS — F32A Depression, unspecified: Secondary | ICD-10-CM

## 2019-03-10 DIAGNOSIS — F419 Anxiety disorder, unspecified: Secondary | ICD-10-CM

## 2019-03-10 DIAGNOSIS — Z1239 Encounter for other screening for malignant neoplasm of breast: Secondary | ICD-10-CM | POA: Diagnosis not present

## 2019-03-10 DIAGNOSIS — Z7289 Other problems related to lifestyle: Secondary | ICD-10-CM

## 2019-03-10 LAB — COMPREHENSIVE METABOLIC PANEL
ALT: 19 U/L (ref 0–35)
AST: 24 U/L (ref 0–37)
Albumin: 4.4 g/dL (ref 3.5–5.2)
Alkaline Phosphatase: 121 U/L — ABNORMAL HIGH (ref 39–117)
BUN: 28 mg/dL — ABNORMAL HIGH (ref 6–23)
CO2: 26 mEq/L (ref 19–32)
Calcium: 10.6 mg/dL — ABNORMAL HIGH (ref 8.4–10.5)
Chloride: 105 mEq/L (ref 96–112)
Creatinine, Ser: 0.79 mg/dL (ref 0.40–1.20)
GFR: 75.31 mL/min (ref >60.00)
Glucose, Bld: 97 mg/dL (ref 70–99)
Potassium: 5 mEq/L (ref 3.5–5.1)
Sodium: 138 mEq/L (ref 135–145)
Total Bilirubin: 0.6 mg/dL (ref 0.2–1.2)
Total Protein: 6.9 g/dL (ref 6.0–8.3)

## 2019-03-10 LAB — LIPID PANEL
Cholesterol: 234 mg/dL — ABNORMAL HIGH (ref 0–200)
HDL: 107.7 mg/dL (ref >39.00)
LDL Cholesterol: 109 mg/dL — ABNORMAL HIGH (ref 0–99)
NonHDL: 126.46
Total CHOL/HDL Ratio: 2
Triglycerides: 89 mg/dL (ref 0.0–149.0)
VLDL: 17.8 mg/dL (ref 0.0–40.0)

## 2019-03-10 LAB — CBC
HCT: 45.7 % (ref 36.0–46.0)
Hemoglobin: 15.4 g/dL — ABNORMAL HIGH (ref 12.0–15.0)
MCHC: 33.7 g/dL (ref 30.0–36.0)
MCV: 91.3 fl (ref 78.0–100.0)
Platelets: 151 10*3/uL (ref 150.0–400.0)
RBC: 5 Mil/uL (ref 3.87–5.11)
RDW: 13.8 % (ref 11.5–15.5)
WBC: 4.5 10*3/uL (ref 4.0–10.5)

## 2019-03-10 LAB — VITAMIN D 25 HYDROXY (VIT D DEFICIENCY, FRACTURES): VITD: 31.66 ng/mL (ref 30.00–100.00)

## 2019-03-10 LAB — TSH: TSH: 3.2 u[IU]/mL (ref 0.35–4.50)

## 2019-03-10 NOTE — Patient Instructions (Addendum)
[employer]ron Blood Pressure cuff, upper arm  Pulse oximeter  Vitals weekly Vitamin C [phone] mg twice daily Zinc 30-50 mg once daily Open Windows Call Surgeon Call GI for colonoscopy  Preventive Care 19-56 Years Old, Female Preventive care refers to visits with your health care provider and lifestyle choices that can promote health and wellness. This includes:  A yearly physical exam. This may also be called an annual well check.  Regular dental visits and eye exams.  Immunizations.  Screening for certain conditions.  Healthy lifestyle choices, such as eating a healthy diet, getting regular exercise, not using drugs or products that contain nicotine and tobacco, and limiting alcohol use. What can I expect for my preventive care visit? Physical exam Your health care provider will check your:  Height and weight. This may be used to calculate body mass index (BMI), which tells if you are at a healthy weight.  Heart rate and blood pressure.  Skin for abnormal spots. Counseling Your health care provider may ask you questions about your:  Alcohol, tobacco, and drug use.  Emotional well-being.  Home and relationship well-being.  Sexual activity.  Eating habits.  Work and work Statistician.  Method of birth control.  Menstrual cycle.  Pregnancy history. What immunizations do I need?  Influenza (flu) vaccine  This is recommended every year. Tetanus, diphtheria, and pertussis (Tdap) vaccine  You may need a Td booster every 10 years. Varicella (chickenpox) vaccine  You may need this if you have not been vaccinated. Zoster (shingles) vaccine  You may need this after age 18. Measles, mumps, and rubella (MMR) vaccine  You may need at least one dose of MMR if you were born in 1957 or later. You may also need a second dose. Pneumococcal conjugate (PCV13) vaccine  You may need this if you have certain conditions and were not previously vaccinated. Pneumococcal  polysaccharide (PPSV23) vaccine  You may need one or two doses if you smoke cigarettes or if you have certain conditions. Meningococcal conjugate (MenACWY) vaccine  You may need this if you have certain conditions. Hepatitis A vaccine  You may need this if you have certain conditions or if you travel or work in places where you may be exposed to hepatitis A. Hepatitis B vaccine  You may need this if you have certain conditions or if you travel or work in places where you may be exposed to hepatitis B. Haemophilus influenzae type b (Hib) vaccine  You may need this if you have certain conditions. Human papillomavirus (HPV) vaccine  If recommended by your health care provider, you may need three doses over 6 months. You may receive vaccines as individual doses or as more than one vaccine together in one shot (combination vaccines). Talk with your health care provider about the risks and benefits of combination vaccines. What tests do I need? Blood tests  Lipid and cholesterol levels. These may be checked every 5 years, or more frequently if you are over 12 years old.  Hepatitis C test.  Hepatitis B test. Screening  Lung cancer screening. You may have this screening every year starting at age 1 if you have a 30-pack-year history of smoking and currently smoke or have quit within the past 15 years.  Colorectal cancer screening. All adults should have this screening starting at age 1 and continuing until age 67. Your health care provider may recommend screening at age 96 if you are at increased risk. You will have tests every 1-10 years, depending on your  results and the type of screening test.  Diabetes screening. This is done by checking your blood sugar (glucose) after you have not eaten for a while (fasting). You may have this done every 1-3 years.  Mammogram. This may be done every 1-2 years. Talk with your health care provider about when you should start having regular  mammograms. This may depend on whether you have a family history of breast cancer.  BRCA-related cancer screening. This may be done if you have a family history of breast, ovarian, tubal, or peritoneal cancers.  Pelvic exam and Pap test. This may be done every 3 years starting at age 98. Starting at age 27, this may be done every 5 years if you have a Pap test in combination with an HPV test. Other tests  Sexually transmitted disease (STD) testing.  Bone density scan. This is done to screen for osteoporosis. You may have this scan if you are at high risk for osteoporosis. Follow these instructions at home: Eating and drinking  Eat a diet that includes fresh fruits and vegetables, whole grains, lean protein, and low-fat dairy.  Take vitamin and mineral supplements as recommended by your health care provider.  Do not drink alcohol if: ? Your health care provider tells you not to drink. ? You are pregnant, may be pregnant, or are planning to become pregnant.  If you drink alcohol: ? Limit how much you have to 0-1 drink a day. ? Be aware of how much alcohol is in your drink. In the [place] one drink equals one 12 oz bottle of beer (355 mL), one 5 oz glass of wine (148 mL), or one 1 oz glass of hard liquor (44 mL). Lifestyle  Take daily care of your teeth and gums.  Stay active. Exercise for at least 30 minutes on 5 or more days each week.  Do not use any products that contain nicotine or tobacco, such as cigarettes, e-cigarettes, and chewing tobacco. If you need help quitting, ask your health care provider.  If you are sexually active, practice safe sex. Use a condom or other form of birth control (contraception) in order to prevent pregnancy and STIs (sexually transmitted infections).  If told by your health care provider, take low-dose aspirin daily starting at age 69. What's next?  Visit your health care provider once a year for a well check visit.  Ask your health care provider  how often you should have your eyes and teeth checked.  Stay up to date on all vaccines. This information is not intended to replace advice given to you by your health care provider. Make sure you discuss any questions you have with your health care provider. Document Released: 08/13/2015 Document Revised: 03/28/2018 Document Reviewed: 03/28/2018 Elsevier Patient Education  2020 [employer].

## 2019-03-11 LAB — HEPATITIS C ANTIBODY
Hepatitis C Ab: NONREACTIVE
SIGNAL TO CUT-OFF: 0.01 (ref <1.00)

## 2019-03-11 LAB — PARATHYROID HORMONE, INTACT (NO CA): PTH: 262 pg/mL — ABNORMAL HIGH (ref 14–64)

## 2019-03-12 NOTE — Assessment & Plan Note (Signed)
Patient encouraged to maintain heart healthy diet, regular exercise, adequate sleep. Consider daily probiotics. Take medications as prescribed. Labs ordered and reviewed 

## 2019-03-12 NOTE — Assessment & Plan Note (Signed)
Is now following with endocrinology and general surgery and she is anticipating surgical intervention in the near future

## 2019-03-12 NOTE — Assessment & Plan Note (Signed)
Stable on Sertraline continue the same

## 2019-03-12 NOTE — Progress Notes (Signed)
Subjective:    Patient ID: Sheryl [name], female    DOB: [birth year], 58 y.o.   MRN: [id number]  No chief complaint on file.   HPI Patient is in today for annual preventative exam and follow up on chronic medical concerns including anxiety, depression, hyperlipidemia and vitamin D deficiency. She feels well today. No recent febrile illness or hospitalizations. She has been maintaining quarantine well. Is trying to maintain a heart healthy diet but is not always successful. Denies CP/palp/SOB/HA/congestion/fevers/GI or GU c/o. Taking meds as prescribed  Past Medical History:  Diagnosis Date  . Anemia   . Anxiety   . Anxiety and depression    Follows with Dr Gala Murdoch q 6 months Has taken Zoloft in past without good results   . Chicken pox as a child  . Depression   . Elevated BP 02/06/2012  . FH: colon cancer 06/07/2011  . GERD (gastroesophageal reflux disease)   . 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 Hyperlipidemia 05/08/2012  . Osteopenia 02/27/2017  . Overweight(278.02)   . Preventative health care 06/07/2011  . Sacral pain 05/08/2012  . Tachycardia 11/10/2014  . Vitamin D deficiency 05/09/2014    Past Surgical History:  Procedure Laterality Date  . [place]   X 2  . COLONOSCOPY    . GUM SURGERY  2007  . right hand surgery  2009   infection debrided    Family History  Problem Relation Age of Onset  . Heart attack Mother   . Diabetes Mother        type 2  . Hypertension Mother   . Hyperlipidemia Mother   . Heart disease Mother   . Cancer Father        colon  . Heart attack Father   . Colon cancer Father   . Hypertension Sister   . Diabetes Sister        type 2  . Hyperlipidemia Sister   . Heart disease Sister   . Hypertension Sister   . Arthritis Sister   . Diabetes Maternal Grandmother   . Hypertension Maternal Grandmother   . Hyperlipidemia Maternal Grandmother     Social History   Socioeconomic History  .  Marital status: Married    Spouse name: Not on file  . Number of children: Not on file  . Years of education: Not on file  . Highest education level: Not on file  Occupational History  . Not on file  Social Needs  . Financial resource strain: Not on file  . Food insecurity    Worry: Not on file    Inability: Not on file  . Transportation needs    Medical: Not on file    Non-medical: Not on file  Tobacco Use  . Smoking status: Never Smoker  . Smokeless tobacco: Never Used  Substance and Sexual Activity  . Alcohol use: Yes    Alcohol/week: 35.0 standard drinks    Types: 21 Glasses of wine, 14 Standard drinks or equivalent per week    Comment: glass of wine nightly  . Drug use: No  . Sexual activity: Yes    Partners: Male    Comment: no dietary restrictions, lives with husband  Lifestyle  . Physical activity    Days per week: Not on file    Minutes per session: Not on file  . Stress: Not on file  Relationships  . Social connections    Talks on phone: Not on file  Gets together: Not on file    Attends religious service: Not on file    Active member of club or organization: Not on file    Attends meetings of clubs or organizations: Not on file    Relationship status: Not on file  . Intimate partner violence    Fear of current or ex partner: Not on file    Emotionally abused: Not on file    Physically abused: Not on file    Forced sexual activity: Not on file  Other Topics Concern  . Not on file  Social History Narrative  . Not on file    Outpatient Medications Prior to Visit  Medication Sig Dispense Refill  . Coenzyme Q10 (CO Q 10 PO) Take 1 capsule by mouth daily.    . ergocalciferol (DRISDOL) 50000 units capsule Take 1 capsule (50,000 Units total) by mouth once a week. 4 capsule 4  . metoprolol succinate (TOPROL-XL) 50 MG 24 hr tablet TAKE 1 TABLET BY MOUTH DAILY WITH OR IMMEDIATELY FOLLOWING A MEAL 90 tablet 1  . Multiple Vitamin (MULTIVITAMIN) capsule Take 1  capsule by mouth daily.      . pantoprazole (PROTONIX) 40 MG tablet TAKE 1 TABLET (40 MG TOTAL) BY MOUTH DAILY AS NEEDED. 90 tablet 1  . sertraline (ZOLOFT) 100 MG tablet TAKE 1.5 TABLETS (150 MG TOTAL) BY MOUTH DAILY. 135 tablet 1   No facility-administered medications prior to visit.     Allergies  Allergen Reactions  . Penicillins Rash    Review of Systems  Constitutional: Positive for malaise/fatigue. Negative for chills and fever.  HENT: Negative for congestion and hearing loss.   Eyes: Negative for discharge.  Respiratory: Negative for cough, sputum production and shortness of breath.   Cardiovascular: Negative for chest pain, palpitations and leg swelling.  Gastrointestinal: Negative for abdominal pain, blood in stool, constipation, diarrhea, heartburn, nausea and vomiting.  Genitourinary: Negative for dysuria, frequency, hematuria and urgency.  Musculoskeletal: Negative for back pain, falls and myalgias.  Skin: Negative for rash.  Neurological: Negative for dizziness, sensory change, loss of consciousness, weakness and headaches.  Endo/Heme/Allergies: Negative for environmental allergies. Does not bruise/bleed easily.  Psychiatric/Behavioral: Negative for depression and suicidal ideas. The patient is nervous/anxious. The patient does not have insomnia.        Objective:    Physical Exam Constitutional:      General: She is not in acute distress.    Appearance: She is well-developed.  HENT:     Head: Normocephalic and atraumatic.  Eyes:     Conjunctiva/sclera: Conjunctivae normal.  Neck:     Musculoskeletal: Neck supple.     Thyroid: No thyromegaly.  Cardiovascular:     Rate and Rhythm: Normal rate and regular rhythm.     Heart sounds: Normal heart sounds. No murmur.  Pulmonary:     Effort: Pulmonary effort is normal. No respiratory distress.     Breath sounds: Normal breath sounds.  Abdominal:     General: Bowel sounds are normal. There is no distension.      Palpations: Abdomen is soft. There is no mass.     Tenderness: There is no abdominal tenderness.  Lymphadenopathy:     Cervical: No cervical adenopathy.  Skin:    General: Skin is warm and dry.  Neurological:     Mental Status: She is alert and oriented to person, place, and time.  Psychiatric:        Behavior: Behavior normal.     BP 122/82 (  BP Location: Left Arm, Patient Position: Sitting, Cuff Size: Normal)   Pulse 73   Temp 98 F (36.7 C) (Oral)   Resp 18   Ht 5\' 1"  (1.549 m)   Wt 168 lb 3.2 oz (76.3 kg)   LMP 09/08/2011   SpO2 98%   BMI 31.78 kg/m  Wt Readings from Last 3 Encounters:  03/10/19 168 lb 3.2 oz (76.3 kg)  12/11/18 170 lb (77.1 kg)  09/18/18 167 lb 12.8 oz (76.1 kg)    Diabetic Foot Exam - Simple   No data filed     Lab Results  Component Value Date   WBC 4.5 03/10/2019   HGB 15.4 (H) 03/10/2019   HCT 45.7 03/10/2019   PLT 151.0 03/10/2019   GLUCOSE 97 03/10/2019   CHOL 234 (H) 03/10/2019   TRIG 89.0 03/10/2019   HDL 107.70 03/10/2019   LDLDIRECT 103.8 01/10/2013   LDLCALC 109 (H) 03/10/2019   ALT 19 03/10/2019   AST 24 03/10/2019   NA 138 03/10/2019   K 5.0 03/10/2019   CL 105 03/10/2019   CREATININE 0.79 03/10/2019   BUN 28 (H) 03/10/2019   CO2 26 03/10/2019   TSH 3.20 03/10/2019   HGBA1C 5.0 12/07/2014    Lab Results  Component Value Date   TSH 3.20 03/10/2019   Lab Results  Component Value Date   WBC 4.5 03/10/2019   HGB 15.4 (H) 03/10/2019   HCT 45.7 03/10/2019   MCV 91.3 03/10/2019   PLT 151.0 03/10/2019   Lab Results  Component Value Date   NA 138 03/10/2019   K 5.0 03/10/2019   CO2 26 03/10/2019   GLUCOSE 97 03/10/2019   BUN 28 (H) 03/10/2019   CREATININE 0.79 03/10/2019   BILITOT 0.6 03/10/2019   ALKPHOS 121 (H) 03/10/2019   AST 24 03/10/2019   ALT 19 03/10/2019   PROT 6.9 03/10/2019   ALBUMIN 4.4 03/10/2019   CALCIUM 10.6 (H) 03/10/2019   ANIONGAP 7 11/10/2014   GFR 75.31 03/10/2019   Lab Results   Component Value Date   CHOL 234 (H) 03/10/2019   Lab Results  Component Value Date   HDL 107.70 03/10/2019   Lab Results  Component Value Date   LDLCALC 109 (H) 03/10/2019   Lab Results  Component Value Date   TRIG 89.0 03/10/2019   Lab Results  Component Value Date   CHOLHDL 2 03/10/2019   Lab Results  Component Value Date   HGBA1C 5.0 12/07/2014       Assessment & Plan:   Problem List Items Addressed This Visit    Anxiety and depression    Stable on Sertraline continue the same      GERD (gastroesophageal reflux disease)    Avoid offending foods, start probiotics. Do not eat large meals in late evening and consider raising head of bed.       Overweight    Encouraged DASH diet, decrease po intake and increase exercise as tolerated. Needs 7-8 hours of sleep nightly. Avoid trans fats, eat small, frequent meals every 4-5 hours with lean proteins, complex carbs and healthy fats. Minimize simple carbs, consider [employer] APP      Preventative health care    Patient encouraged to maintain heart healthy diet, regular exercise, adequate sleep. Consider daily probiotics. Take medications as prescribed. Labs ordered and reviewed      Hyperlipidemia    Encouraged heart healthy diet, increase exercise, avoid trans fats, consider a krill oil cap daily  Relevant Orders   Lipid panel (Completed)   Vitamin D insufficiency   Osteopenia    Encouraged to get adequate exercise, calcium and vitamin d intake      Hyperparathyroidism ([place])    Is now following with endocrinology and general surgery and she is anticipating surgical intervention in the near future      Relevant Orders   CBC (Completed)   Comprehensive metabolic panel (Completed)   TSH (Completed)   PTH, intact (no Ca) (Completed)    Other Visit Diagnoses    Vitamin D deficiency    -  Primary   Relevant Orders   VITAMIN D 25 Hydroxy (Vit-D Deficiency, Fractures) (Completed)   Breast cancer screening        Relevant Orders   MM 3D SCREEN BREAST BILATERAL   Other problems related to lifestyle       Relevant Orders   Hepatitis C antibody (Completed)      I am having Sheryl Koller. [place] "Kim" maintain her multivitamin, Coenzyme Q10 (CO Q 10 PO), ergocalciferol, sertraline, pantoprazole, and metoprolol succinate.  No orders of the defined types were placed in this encounter.    Penni Homans, MD

## 2019-03-12 NOTE — Assessment & Plan Note (Signed)
Encouraged to get adequate exercise, calcium and vitamin d intake 

## 2019-03-12 NOTE — Assessment & Plan Note (Signed)
Encouraged heart healthy diet, increase exercise, avoid trans fats, consider a krill oil cap daily 

## 2019-03-12 NOTE — Assessment & Plan Note (Signed)
Avoid offending foods, start probiotics. Do not eat large meals in late evening and consider raising head of bed.  

## 2019-03-12 NOTE — Assessment & Plan Note (Signed)
Encouraged DASH diet, decrease po intake and increase exercise as tolerated. Needs 7-8 hours of sleep nightly. Avoid trans fats, eat small, frequent meals every 4-5 hours with lean proteins, complex carbs and healthy fats. Minimize simple carbs, consider WW APP 

## 2019-03-13 ENCOUNTER — Other Ambulatory Visit: Payer: Self-pay | Admitting: Surgery

## 2019-03-13 DIAGNOSIS — E210 Primary hyperparathyroidism: Secondary | ICD-10-CM

## 2019-03-17 ENCOUNTER — Other Ambulatory Visit: Payer: Self-pay | Admitting: Family Medicine

## 2019-03-20 ENCOUNTER — Ambulatory Visit (HOSPITAL_BASED_OUTPATIENT_CLINIC_OR_DEPARTMENT_OTHER): Payer: 59

## 2019-04-03 ENCOUNTER — Ambulatory Visit
Admission: RE | Admit: 2019-04-03 | Discharge: 2019-04-03 | Disposition: A | Discharge status: Home / Self Care | Payer: 59 | Source: Ambulatory Visit | Attending: Surgery | Admitting: Surgery

## 2019-04-03 ENCOUNTER — Other Ambulatory Visit: Payer: Self-pay

## 2019-04-03 ENCOUNTER — Ambulatory Visit (HOSPITAL_BASED_OUTPATIENT_CLINIC_OR_DEPARTMENT_OTHER)
Admission: RE | Admit: 2019-04-03 | Discharge: 2019-04-03 | Disposition: A | Discharge status: Home / Self Care | Payer: 59 | Source: Ambulatory Visit | Attending: Family Medicine | Admitting: Family Medicine

## 2019-04-03 DIAGNOSIS — Z1231 Encounter for screening mammogram for malignant neoplasm of breast: Secondary | ICD-10-CM | POA: Insufficient documentation

## 2019-04-03 DIAGNOSIS — E210 Primary hyperparathyroidism: Secondary | ICD-10-CM

## 2019-04-03 DIAGNOSIS — Z1239 Encounter for other screening for malignant neoplasm of breast: Secondary | ICD-10-CM

## 2019-04-03 IMAGING — CT CT NECK SOFT TISSUE WO/W CM
2 series · 16 of 30 positions shown, 19 images · IV contrast (APPLIED)
Comparison: Nuclear medicine parathyroid scan 02/26/2019.

CLINICAL DATA: Primary hyperparathyroidism.

EXAM:
CT NECK WITH AND WITHOUT CONTRAST
TECHNIQUE: Multidetector CT imaging of the neck was performed without and with
intravenous contrast.
CONTRAST:  75mL ZX1QCZ-APN IOPAMIDOL (ZX1QCZ-APN) INJECTION 76%

[Series 5: (id) · axial · 0.38mm/px · z∈[-246,-125]mm · 10 of 143 slices shown, 13 images (1 of 2)]
[im 11/143  soft-tissue]
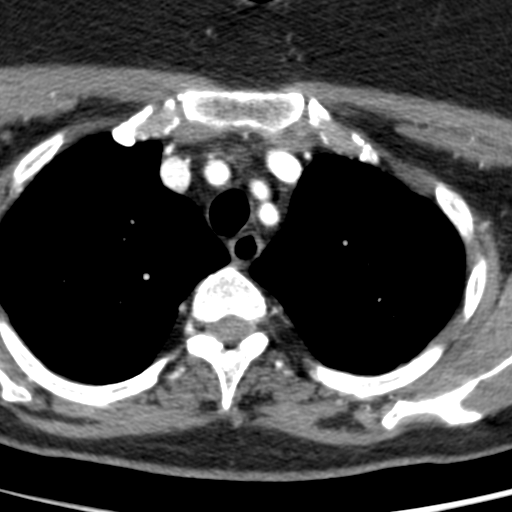
[im 11/143  bone]
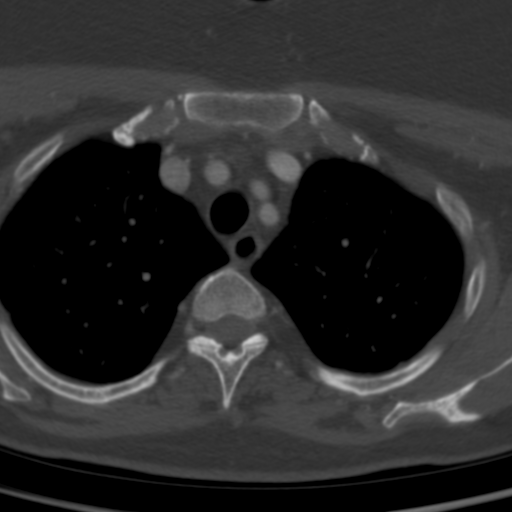
[im 22/143  bone]
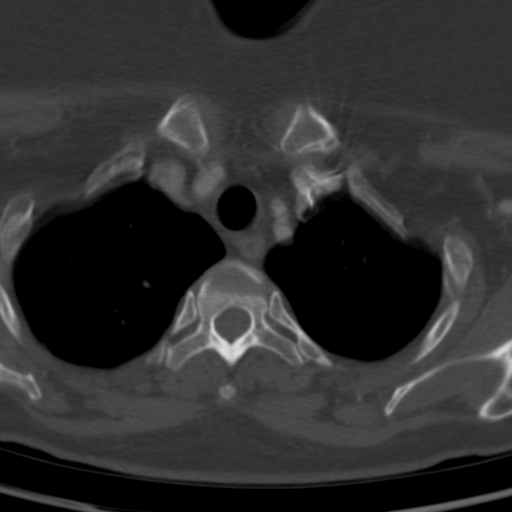
[im 44/143  bone]
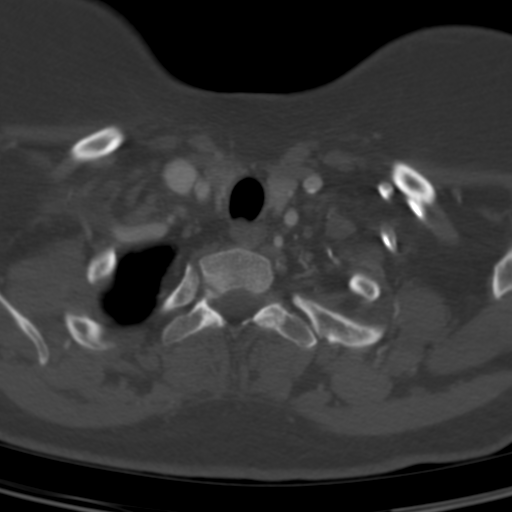
[im 55/143  bone]
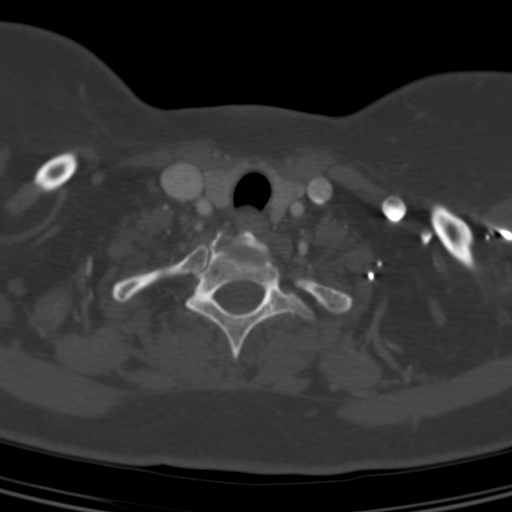
[im 66/143  soft-tissue]
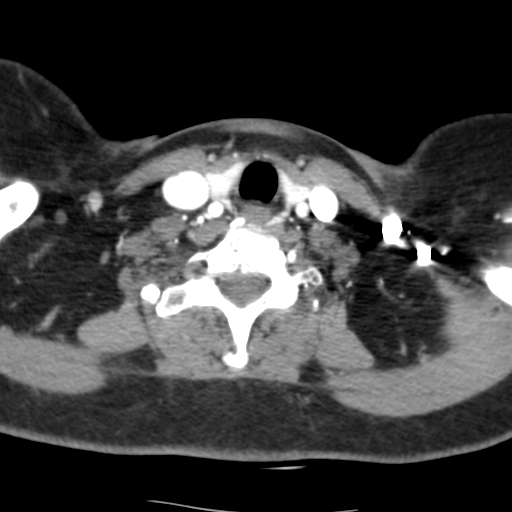
[im 66/143  bone]
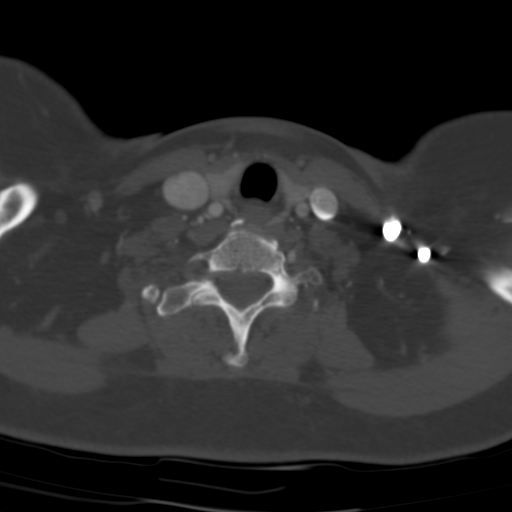
[im 77/143  bone]
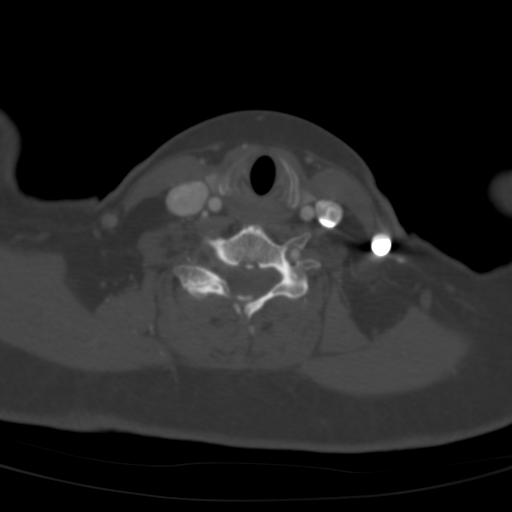
[im 88/143  bone]
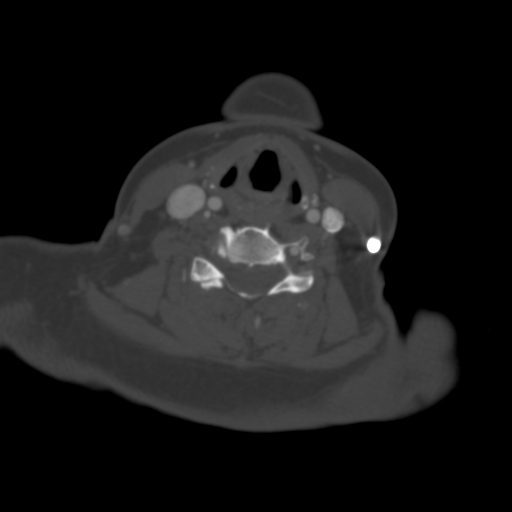
[im 110/143  bone]
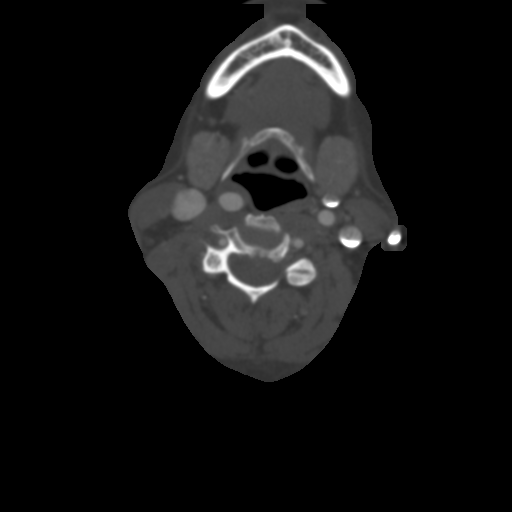
[im 121/143  soft-tissue]
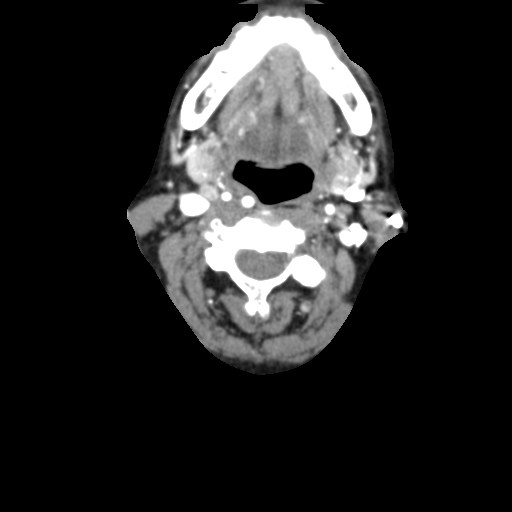
[im 121/143  bone]
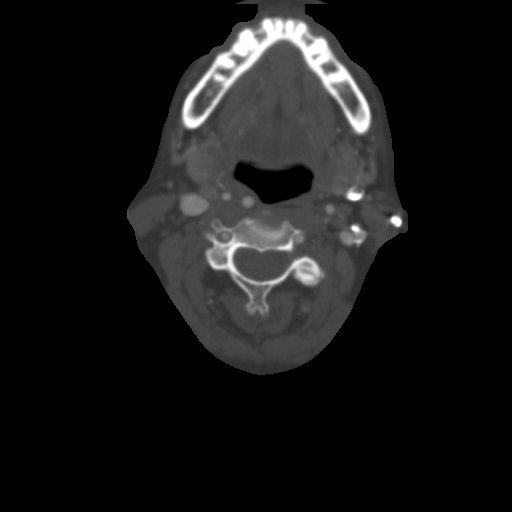
[im 132/143  bone]
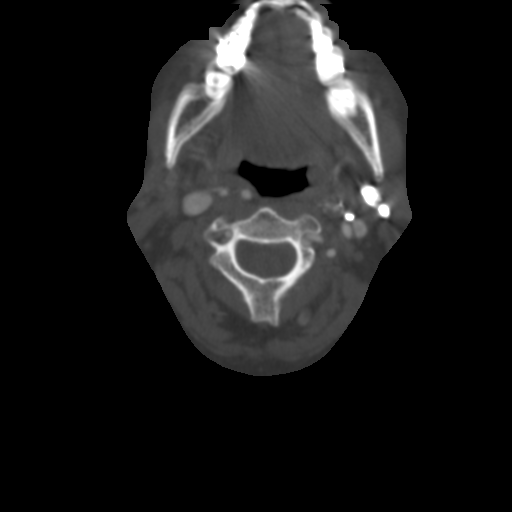

[Series 11: (id) · sagittal · 0.31mm/px · 6 of 93 slices shown (2 of 2)]
[im 47/93  soft-tissue]
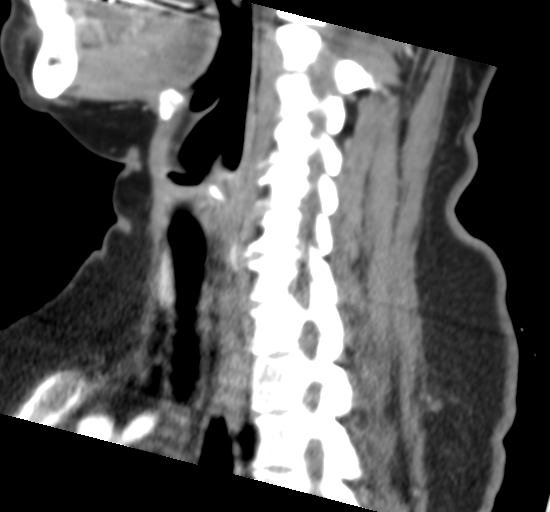
[im 68/93  bone]
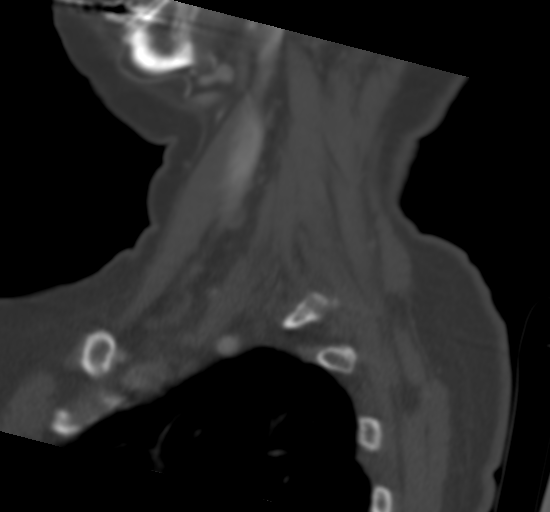
[im 71/93  bone]
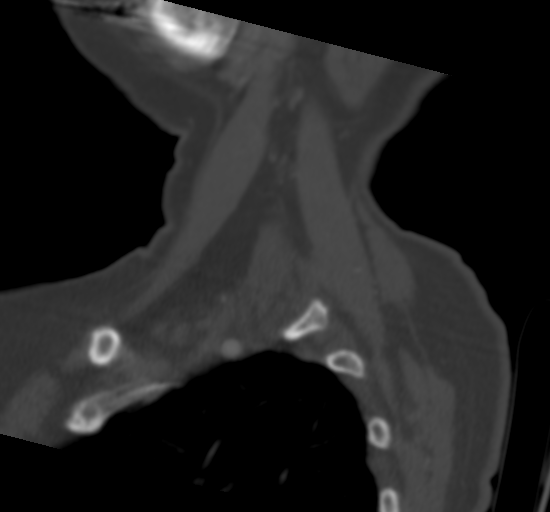
[im 74/93  bone]
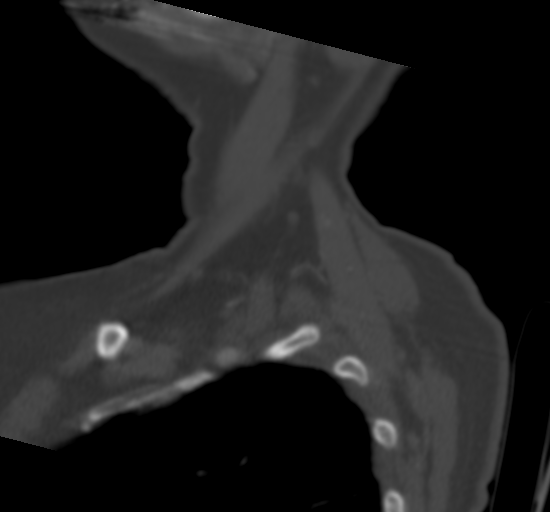
[im 77/93  bone]
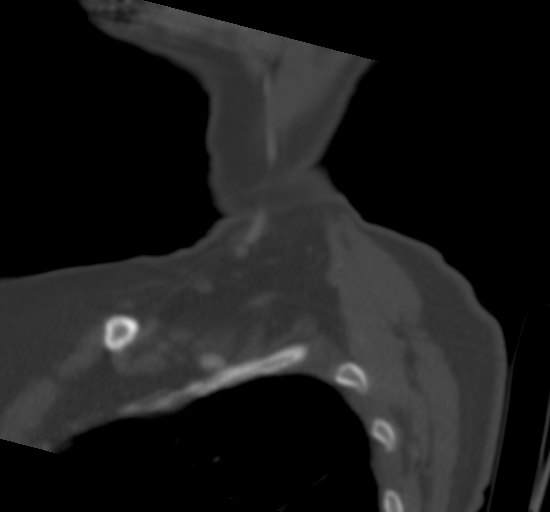
[im 80/93  bone]
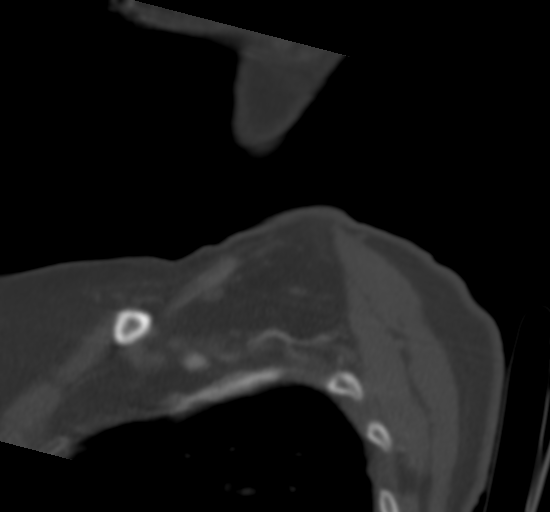

[16 of 30 positions shown; findings below may reference images not displayed]

FINDINGS: Pharynx and larynx: No pharyngeal mass or edema. 6 mm Tornwaldt cyst
in the posterior nasopharynx in the midline.

Salivary glands: No inflammation, mass, or stone.

Thyroid: 3 phase scanning to evaluate for parathyroid adenoma.
Thyroid normal in size and without focal nodularity or
calcification. Small nodule is present posterior to the left lobe of
the thyroid and lateral to the esophagus. On the unenhanced images,
this has a different density than thyroid tissue and measures
approximately 7 x 3 mm. This may correspond to the sestamibi
abnormality on the left and likely represents a parathyroid adenoma.

Lymph nodes: No enlarged lymph nodes in the neck.

Vascular: Normal vascular enhancement

Limited intracranial: Negative

Visualized orbits: Not imaged

Mastoids and visualized paranasal sinuses: Negative

Skeleton: Negative cervical spondylosis. No acute skeletal
abnormality.

Upper chest: Lung apices clear bilaterally.

Other: None
IMPRESSION: 1. Subtle nodule posterior left lobe of the thyroid likely
parathyroid adenoma measuring 7 x 3 mm.
2. Normal thyroid.
3. Otherwise negative

## 2019-04-03 MED ORDER — IOPAMIDOL (ISOVUE-370) INJECTION 76%
75.0000 mL | Freq: Once | INTRAVENOUS | Status: AC | PRN
  Administered 2019-04-03: 75 mL via INTRAVENOUS

## 2019-05-06 ENCOUNTER — Ambulatory Visit: Payer: Self-pay | Admitting: Surgery

## 2019-05-11 ENCOUNTER — Encounter: Payer: Self-pay | Admitting: Surgery

## 2019-05-11 NOTE — H&P (Signed)
General Surgery [facility]  Sheryl [name] DOB: 1960-09-19 Married / Language: English / Race: White Female   History of Present Illness   The patient is a 58 year old female who presents with primary hyperparathyroidism.  CHIEF COMPLAINT: primary hyperparathyroidism  Patient is referred by Dr. Vivia Ewing for surgical evaluation and management of suspected primary hyperparathyroidism. Patient had been noted several years ago to have elevated serum calcium levels. She underwent initial laboratory testing but never had any imaging studies performed. She was lost to follow-up for a few years and is now established a relationship with her endocrinologist. Recent laboratory studies show elevated calcium levels ranging from 10.5-11.2. Intact PTH levels are elevated ranging from 148-229. 24-hour urine collection for calcium is elevated at 326. 25-hydroxy vitamin D level is slightly low at 24.69. Patient is currently taking a vitamin D supplement. Patient has had no prior head or neck surgery. She has noted mild fatigue. She has a bone density scan showing osteopenia. She has had no recent fractures. She denies nephrolithiasis. She denies bone or joint pain. There is no family history of parathyroid disease or other endocrine neoplasm. Patient presents today for surgical consultation and recommendations.   Past Surgical History  Cesarean Section - Multiple  Colon Polyp Removal - Colonoscopy   Diagnostic Studies History Colonoscopy  5-10 years ago Mammogram  1-3 years ago Pap Smear  1-5 years ago  Allergies Penicillins  Allergies Reconciled   Medication History  Metoprolol Succinate ER (25MG  Tablet ER 24HR, Oral) Active. Pantoprazole Sodium (20MG  Tablet DR, Oral) Active. Sertraline HCl (25MG  Tablet, Oral) Active. Ergocalciferol (1.25 MG(50000 UT) Capsule, Oral) Active. Medications Reconciled  Social History  Alcohol use  Moderate  alcohol use. Caffeine use  Coffee. No drug use  Tobacco use  Never smoker.  Family History  Colon Cancer  Father. Diabetes Mellitus  Mother. Heart Disease  Mother. Heart disease in female family member before age 50  Hypertension  Mother. Prostate Cancer  Father.  Pregnancy / Birth History Age at menarche  42 years. Gravida  2 Maternal age  70-20 Para  2  Other Problems  Anxiety Disorder  Gastroesophageal Reflux Disease  Thyroid Disease   Review of Systems  General Present- Weight Gain. Not Present- Appetite Loss, Chills, Fatigue, Fever, Night Sweats and Weight Loss. Skin Present- Dryness. Not Present- Change in Wart/Mole, Hives, Jaundice, New Lesions, Non-Healing Wounds, Rash and Ulcer. HEENT Present- Earache, Oral Ulcers, Seasonal Allergies, Sinus Pain and Wears glasses/contact lenses. Not Present- Hearing Loss, Hoarseness, Nose Bleed, Ringing in the Ears, Sore Throat, Visual Disturbances and Yellow Eyes. Respiratory Not Present- Bloody sputum, Chronic Cough, Difficulty Breathing, Snoring and Wheezing. Breast Not Present- Breast Mass, Breast Pain, Nipple Discharge and Skin Changes. Cardiovascular Not Present- Chest Pain, Difficulty Breathing Lying Down, Leg Cramps, Palpitations, Rapid Heart Rate, Shortness of Breath and Swelling of Extremities. Gastrointestinal Present- Indigestion. Not Present- Abdominal Pain, Bloating, Bloody Stool, Change in Bowel Habits, Chronic diarrhea, Constipation, Difficulty Swallowing, Excessive gas, Gets full quickly at meals, Hemorrhoids, Nausea, Rectal Pain and Vomiting. Female Genitourinary Not Present- Frequency, Nocturia, Painful Urination, Pelvic Pain and Urgency. Musculoskeletal Not Present- Back Pain, Joint Pain, Joint Stiffness, Muscle Pain, Muscle Weakness and Swelling of Extremities. Neurological Present- Headaches. Not Present- Decreased Memory, Fainting, Numbness, Seizures, Tingling, Tremor, Trouble walking and  Weakness. Psychiatric Present- Anxiety. Not Present- Bipolar, Change in Sleep Pattern, Depression, Fearful and Frequent crying. Endocrine Present- Hot flashes. Not Present- Cold Intolerance, Excessive Hunger, Hair Changes, Heat  Intolerance and New Diabetes. Hematology Present- Easy Bruising and Gland problems. Not Present- Blood Thinners, Excessive bleeding, HIV and Persistent Infections.  Vitals  Weight: 167.8 lb Height: 61in Body Surface Area: 1.75 m Body Mass Index: 31.71 kg/m  Temp.: 99.19F (Oral)  Pulse: 130 (Regular)  BP: 168/90(Sitting, Left Arm, Standard)  Physical Exam  See vital signs recorded above  GENERAL APPEARANCE Development: normal Nutritional status: normal Gross deformities: none  SKIN Rash, lesions, ulcers: none Induration, erythema: none Nodules: none palpable  EYES Conjunctiva and lids: normal Pupils: equal and reactive Iris: normal bilaterally  EARS, NOSE, MOUTH, THROAT External ears: no lesion or deformity External nose: no lesion or deformity Hearing: grossly normal  NECK Symmetric: yes Trachea: midline Thyroid: no palpable nodules in the thyroid bed  CHEST Respiratory effort: normal Retraction or accessory muscle use: no Breath sounds: normal bilaterally Rales, rhonchi, wheeze: none  CARDIOVASCULAR Auscultation: regular rhythm, normal rate Murmurs: none Pulses: carotid and radial pulse 2+ palpable Lower extremity edema: none Lower extremity varicosities: none  MUSCULOSKELETAL Station and gait: normal Digits and nails: no clubbing or cyanosis Muscle strength: grossly normal all extremities Range of motion: grossly normal all extremities Deformity: none  LYMPHATIC Cervical: none palpable Supraclavicular: none palpable  PSYCHIATRIC Oriented to person, place, and time: yes Mood and affect: normal for situation Judgment and insight: appropriate for situation    Assessment & Plan   PRIMARY HYPERPARATHYROIDISM  (E21.0) OSTEOPENIA (M85.80)  Follow Up - Call [facility] office after tests / studies doneto discuss further plans  Patient presents on referral from her endocrinologist for evaluation of suspected primary hyperparathyroidism. Patient is provided with written literature on parathyroid disease to review at home.  Patient has biochemical evidence of primary hyperparathyroidism. She has experienced chronic fatigue as well as demonstrated osteopenia on bone density scanning. I have recommended proceeding with imaging studies to include an ultrasound examination of the neck and a nuclear medicine parathyroid scan. If these studies localize a parathyroid adenoma, then I think she will be an excellent candidate for minimally invasive parathyroid surgery as an outpatient. If these studies failed to localize an adenoma, then I would recommend proceeding with a 4D CT scan of the neck in hopes of finding the parathyroid adenoma.  We discussed parathyroid surgery. We discussed the location of the surgical incision. We discussed the risk and benefits of the surgery including the risk of injury to recurrent laryngeal nerves. We discussed the fact that approximately 95% of patients have a single gland adenoma. Patient understands and wishes to proceed with further studies for evaluation. We will make arrangements for ultrasound and nuclear scan in the near future. We will contact the patient with those results when they're available.   ADDENDUM  Telephone call to patient with results of 4D-CT scan of neck - appears to be a left sided adenoma which corresponds to signal seen on sestamibi scan.  Recommend minimally invasive parathyroidectomy as an out-patient procedure. Discussed with patient who wishes to proceed. Discussed possibility of recurrent nerve injury, second gland adenoma, need for additional surgery. Discussed hospital stay and post op recovery. She understands and wishes to proceed.  The  risks and benefits of the procedure have been discussed at length with the patient. The patient understands the proposed procedure, potential alternative treatments, and the course of recovery to be expected. All of the patient's questions have been answered at this time. The patient wishes to proceed with surgery.  Armandina Gemma, [facility] Office: [phone]

## 2019-05-12 NOTE — Patient Instructions (Signed)
DUE TO COVID-19 ONLY ONE VISITOR IS ALLOWED TO COME WITH YOU AND STAY IN THE WAITING ROOM ONLY DURING PRE OP AND PROCEDURE DAY OF SURGERY. THE 1 VISITOR MAY VISIT WITH YOU AFTER SURGERY IN YOUR PRIVATE ROOM DURING VISITING HOURS ONLY!  YOU NEED TO HAVE A COVID 19 TEST ON_______ @_______ , THIS TEST MUST BE DONE BEFORE SURGERY, COME  Sheryl [name], Sheryl [name] , [id number].  ([place] ) ONCE YOUR COVID TEST IS COMPLETED, PLEASE BEGIN THE QUARANTINE INSTRUCTIONS AS OUTLINED IN YOUR HANDOUT.                Sheryl [name]  05/12/2019   Your procedure is scheduled on: 05-16-19   Report to [facility] Main  Entrance   Report to admitting at       [place] AM     Call this number if you have problems the morning of surgery [phone]    Remember: Do not eat food or drink liquids :After Midnight.    BRUSH YOUR TEETH MORNING OF SURGERY AND RINSE YOUR MOUTH OUT, NO CHEWING GUM CANDY OR MINTS.     Take these medicines the morning of surgery with A SIP OF WATER: zoloft, protonix, metoprolol                                  You may not have any metal on your body including hair pins and              piercings  Do not wear jewelry, make-up, lotions, powders or perfumes, deodorant             Do not wear nail polish on your fingernails.  Do not shave  48 hours prior to surgery.     Do not bring valuables to the hospital. [place].  Contacts, dentures or bridgework may not be worn into surgery.      Patients discharged the day of surgery will not be allowed to drive home. IF YOU ARE HAVING SURGERY AND GOING HOME THE SAME DAY, YOU MUST HAVE AN ADULT TO DRIVE YOU HOME AND BE WITH YOU FOR 24 HOURS. YOU MAY GO HOME BY TAXI OR UBER OR ORTHERWISE, BUT AN ADULT MUST ACCOMPANY YOU HOME AND STAY WITH YOU FOR 24 HOURS.  Name and phone number of your driver:  Special Instructions: N/A              Please read over the  following fact sheets you were given: _____________________________________________________________________             [facility] - Preparing for Surgery Before surgery, you can play an important role.  Because skin is not sterile, your skin needs to be as free of germs as possible.  You can reduce the number of germs on your skin by washing with CHG (chlorahexidine gluconate) soap before surgery.  CHG is an antiseptic cleaner which kills germs and bonds with the skin to continue killing germs even after washing. Please DO NOT use if you have an allergy to CHG or antibacterial soaps.  If your skin becomes reddened/irritated stop using the CHG and inform your nurse when you arrive at Short Stay. Do not shave (including legs and underarms) for at least 48 hours prior to the first CHG shower.  You  may shave your face/neck. Please follow these instructions carefully:  1.  Shower with CHG Soap the night before surgery and the  morning of Surgery.  2.  If you choose to wash your hair, wash your hair first as usual with your  normal  shampoo.  3.  After you shampoo, rinse your hair and body thoroughly to remove the  shampoo.                           4.  Use CHG as you would any other liquid soap.  You can apply chg directly  to the skin and wash                       Gently with a scrungie or clean washcloth.  5.  Apply the CHG Soap to your body ONLY FROM THE NECK DOWN.   Do not use on face/ open                           Wound or open sores. Avoid contact with eyes, ears mouth and genitals (private parts).                       Wash face,  Genitals (private parts) with your normal soap.             6.  Wash thoroughly, paying special attention to the area where your surgery  will be performed.  7.  Thoroughly rinse your body with warm water from the neck down.  8.  DO NOT shower/wash with your normal soap after using and rinsing off  the CHG Soap.                9.  Pat yourself dry with a clean  towel.            10.  Wear clean pajamas.            11.  Place clean sheets on your bed the night of your first shower and do not  sleep with pets. Day of Surgery : Do not apply any lotions/deodorants the morning of surgery.  Please wear clean clothes to the hospital/surgery center.  FAILURE TO FOLLOW THESE INSTRUCTIONS MAY RESULT IN THE CANCELLATION OF YOUR SURGERY PATIENT SIGNATURE_________________________________  NURSE SIGNATURE__________________________________  ________________________________________________________________________

## 2019-05-12 NOTE — Progress Notes (Addendum)
PCP - Vivien Rossetti Cardiologist -   Chest x-ray -  EKG - final ekg 05-13-19 in epic Stress Test -  ECHO -  Cardiac Cath -   Sleep Study -  CPAP -   Fasting Blood Sugar -  Checks Blood Sugar _____ times a day  Blood Thinner Instructions: Aspirin Instructions: Last Dose:  Anesthesia review:   Patient denies shortness of breath, fever, cough and chest pain at PAT appointment   Patient verbalized understanding of instructions that were given to them at the PAT appointment. Patient was also instructed that they will need to review over the PAT instructions again at home before surgery.

## 2019-05-13 ENCOUNTER — Encounter (HOSPITAL_COMMUNITY)
Admission: RE | Admit: 2019-05-13 | Discharge: 2019-05-13 | Disposition: A | Discharge status: Home / Self Care | Payer: 59 | Source: Ambulatory Visit | Attending: Surgery | Admitting: Surgery

## 2019-05-13 ENCOUNTER — Other Ambulatory Visit (HOSPITAL_COMMUNITY)
Admission: RE | Admit: 2019-05-13 | Discharge: 2019-05-13 | Disposition: A | Discharge status: Home / Self Care | Payer: 59 | Source: Ambulatory Visit | Attending: Surgery | Admitting: Surgery

## 2019-05-13 ENCOUNTER — Other Ambulatory Visit: Payer: Self-pay

## 2019-05-13 ENCOUNTER — Encounter (HOSPITAL_COMMUNITY): Payer: Self-pay

## 2019-05-13 DIAGNOSIS — Z01818 Encounter for other preprocedural examination: Secondary | ICD-10-CM | POA: Diagnosis not present

## 2019-05-13 DIAGNOSIS — Z20828 Contact with and (suspected) exposure to other viral communicable diseases: Secondary | ICD-10-CM | POA: Diagnosis not present

## 2019-05-13 HISTORY — DX: Essential (primary) hypertension: I10

## 2019-05-13 LAB — CBC
HCT: 46.7 % — ABNORMAL HIGH (ref 36.0–46.0)
Hemoglobin: 15.6 g/dL — ABNORMAL HIGH (ref 12.0–15.0)
MCH: 30.6 pg (ref 26.0–34.0)
MCHC: 33.4 g/dL (ref 30.0–36.0)
MCV: 91.6 fL (ref 80.0–100.0)
Platelets: 151 10*3/uL (ref 150–400)
RBC: 5.1 MIL/uL (ref 3.87–5.11)
RDW: 13.3 % (ref 11.5–15.5)
WBC: 4.7 10*3/uL (ref 4.0–10.5)
nRBC: 0 % (ref 0.0–0.2)

## 2019-05-13 LAB — BASIC METABOLIC PANEL
Anion gap: 8 (ref 5–15)
BUN: 16 mg/dL (ref 6–20)
CO2: 24 mmol/L (ref 22–32)
Calcium: 10.9 mg/dL — ABNORMAL HIGH (ref 8.9–10.3)
Chloride: 108 mmol/L (ref 98–111)
Creatinine, Ser: 0.8 mg/dL (ref 0.44–1.00)
GFR calc Af Amer: >60 mL/min (ref >60)
GFR calc non Af Amer: >60 mL/min (ref >60)
Glucose, Bld: 103 mg/dL — ABNORMAL HIGH (ref 70–99)
Potassium: 5 mmol/L (ref 3.5–5.1)
Sodium: 140 mmol/L (ref 135–145)

## 2019-05-14 LAB — NOVEL CORONAVIRUS, NAA (HOSP ORDER, SEND-OUT TO REF LAB; TAT 18-24 HRS): SARS-CoV-2, NAA: NOT DETECTED

## 2019-05-15 ENCOUNTER — Telehealth (HOSPITAL_COMMUNITY): Payer: Self-pay | Admitting: *Deleted

## 2019-05-15 NOTE — Progress Notes (Signed)
lvmm for patient to inform of time change, instructed to check in at 5:30 to short stay . Npo after midnight , sips of water take appropriate morning medications .

## 2019-05-16 ENCOUNTER — Encounter (HOSPITAL_COMMUNITY): Payer: Self-pay | Admitting: *Deleted

## 2019-05-16 ENCOUNTER — Other Ambulatory Visit: Payer: Self-pay

## 2019-05-16 ENCOUNTER — Ambulatory Visit (HOSPITAL_COMMUNITY): Payer: 59 | Admitting: Anesthesiology

## 2019-05-16 ENCOUNTER — Ambulatory Visit (HOSPITAL_COMMUNITY): Payer: 59 | Admitting: Physician Assistant

## 2019-05-16 ENCOUNTER — Encounter (HOSPITAL_COMMUNITY)
Admission: RE | Disposition: A | Discharge status: Home / Self Care | Payer: Self-pay | Source: Home / Self Care | Attending: Surgery

## 2019-05-16 ENCOUNTER — Ambulatory Visit (HOSPITAL_COMMUNITY)
Admission: RE | Admit: 2019-05-16 | Discharge: 2019-05-16 | Disposition: A | Discharge status: Home / Self Care | Payer: 59 | Attending: Surgery | Admitting: Surgery

## 2019-05-16 DIAGNOSIS — Z8249 Family history of ischemic heart disease and other diseases of the circulatory system: Secondary | ICD-10-CM | POA: Diagnosis not present

## 2019-05-16 DIAGNOSIS — Z833 Family history of diabetes mellitus: Secondary | ICD-10-CM | POA: Diagnosis not present

## 2019-05-16 DIAGNOSIS — Z6832 Body mass index (BMI) 32.0-32.9, adult: Secondary | ICD-10-CM | POA: Diagnosis not present

## 2019-05-16 DIAGNOSIS — Z8042 Family history of malignant neoplasm of prostate: Secondary | ICD-10-CM | POA: Insufficient documentation

## 2019-05-16 DIAGNOSIS — D649 Anemia, unspecified: Secondary | ICD-10-CM | POA: Diagnosis not present

## 2019-05-16 DIAGNOSIS — F329 Major depressive disorder, single episode, unspecified: Secondary | ICD-10-CM | POA: Diagnosis not present

## 2019-05-16 DIAGNOSIS — K219 Gastro-esophageal reflux disease without esophagitis: Secondary | ICD-10-CM | POA: Insufficient documentation

## 2019-05-16 DIAGNOSIS — D351 Benign neoplasm of parathyroid gland: Secondary | ICD-10-CM | POA: Diagnosis not present

## 2019-05-16 DIAGNOSIS — Z880 Allergy status to penicillin: Secondary | ICD-10-CM | POA: Insufficient documentation

## 2019-05-16 DIAGNOSIS — Z86010 Personal history of colonic polyps: Secondary | ICD-10-CM | POA: Diagnosis not present

## 2019-05-16 DIAGNOSIS — M8580 Other specified disorders of bone density and structure, unspecified site: Secondary | ICD-10-CM | POA: Diagnosis not present

## 2019-05-16 DIAGNOSIS — E210 Primary hyperparathyroidism: Secondary | ICD-10-CM | POA: Insufficient documentation

## 2019-05-16 DIAGNOSIS — Z79899 Other long term (current) drug therapy: Secondary | ICD-10-CM | POA: Insufficient documentation

## 2019-05-16 DIAGNOSIS — Z800 Family history of malignant neoplasm of digestive organs: Secondary | ICD-10-CM | POA: Insufficient documentation

## 2019-05-16 DIAGNOSIS — F419 Anxiety disorder, unspecified: Secondary | ICD-10-CM | POA: Insufficient documentation

## 2019-05-16 DIAGNOSIS — E669 Obesity, unspecified: Secondary | ICD-10-CM | POA: Diagnosis not present

## 2019-05-16 DIAGNOSIS — E213 Hyperparathyroidism, unspecified: Secondary | ICD-10-CM

## 2019-05-16 HISTORY — PX: PARATHYROIDECTOMY: SHX19

## 2019-05-16 SURGERY — PARATHYROIDECTOMY
Anesthesia: General | Site: Neck | Laterality: Left

## 2019-05-16 MED ORDER — OXYCODONE HCL 5 MG PO TABS: 5.0000 mg | ORAL_TABLET | Freq: Once | ORAL | Status: DC | PRN

## 2019-05-16 MED ORDER — LIDOCAINE 2% (20 MG/ML) 5 ML SYRINGE
INTRAMUSCULAR | Status: AC
  Filled 2019-05-16: qty 15

## 2019-05-16 MED ORDER — BUPIVACAINE HCL 0.25 % IJ SOLN
INTRAMUSCULAR | Status: DC | PRN
  Administered 2019-05-16: 10 mL

## 2019-05-16 MED ORDER — FENTANYL CITRATE (PF) 100 MCG/2ML IJ SOLN
INTRAMUSCULAR | Status: AC
  Filled 2019-05-16: qty 2

## 2019-05-16 MED ORDER — FENTANYL CITRATE (PF) 100 MCG/2ML IJ SOLN
INTRAMUSCULAR | Status: DC | PRN
  Administered 2019-05-16 (×2): 50 ug via INTRAVENOUS
  Administered 2019-05-16: 100 ug via INTRAVENOUS

## 2019-05-16 MED ORDER — ROCURONIUM BROMIDE 10 MG/ML (PF) SYRINGE
PREFILLED_SYRINGE | INTRAVENOUS | Status: AC
  Filled 2019-05-16: qty 30

## 2019-05-16 MED ORDER — SUGAMMADEX SODIUM 200 MG/2ML IV SOLN
INTRAVENOUS | Status: DC | PRN
  Administered 2019-05-16: 200 mg via INTRAVENOUS

## 2019-05-16 MED ORDER — ONDANSETRON HCL 4 MG/2ML IJ SOLN: 4.0000 mg | Freq: Four times a day (QID) | INTRAMUSCULAR | Status: DC | PRN

## 2019-05-16 MED ORDER — PROPOFOL 10 MG/ML IV BOLUS
INTRAVENOUS | Status: DC | PRN
  Administered 2019-05-16: 200 mg via INTRAVENOUS

## 2019-05-16 MED ORDER — 0.9 % SODIUM CHLORIDE (POUR BTL) OPTIME
TOPICAL | Status: DC | PRN
  Administered 2019-05-16: 1000 mL

## 2019-05-16 MED ORDER — STERILE WATER FOR IRRIGATION IR SOLN
Status: DC | PRN
  Administered 2019-05-16: 500 mL

## 2019-05-16 MED ORDER — ROCURONIUM BROMIDE 10 MG/ML (PF) SYRINGE
PREFILLED_SYRINGE | INTRAVENOUS | Status: DC | PRN
  Administered 2019-05-16: 70 mg via INTRAVENOUS

## 2019-05-16 MED ORDER — ONDANSETRON HCL 4 MG/2ML IJ SOLN
INTRAMUSCULAR | Status: DC | PRN
  Administered 2019-05-16: 4 mg via INTRAVENOUS

## 2019-05-16 MED ORDER — CIPROFLOXACIN IN D5W 400 MG/200ML IV SOLN
400.0000 mg | INTRAVENOUS | Status: AC
  Administered 2019-05-16: 400 mg via INTRAVENOUS
  Filled 2019-05-16: qty 200

## 2019-05-16 MED ORDER — MIDAZOLAM HCL 5 MG/5ML IJ SOLN
INTRAMUSCULAR | Status: DC | PRN
  Administered 2019-05-16: 2 mg via INTRAVENOUS

## 2019-05-16 MED ORDER — MIDAZOLAM HCL 2 MG/2ML IJ SOLN
INTRAMUSCULAR | Status: AC
  Filled 2019-05-16: qty 2

## 2019-05-16 MED ORDER — DEXAMETHASONE SODIUM PHOSPHATE 10 MG/ML IJ SOLN
INTRAMUSCULAR | Status: AC
  Filled 2019-05-16: qty 3

## 2019-05-16 MED ORDER — ONDANSETRON HCL 4 MG/2ML IJ SOLN
INTRAMUSCULAR | Status: AC
  Filled 2019-05-16: qty 6

## 2019-05-16 MED ORDER — DEXAMETHASONE SODIUM PHOSPHATE 10 MG/ML IJ SOLN
INTRAMUSCULAR | Status: DC | PRN
  Administered 2019-05-16: 10 mg via INTRAVENOUS

## 2019-05-16 MED ORDER — PROPOFOL 10 MG/ML IV BOLUS
INTRAVENOUS | Status: AC
  Filled 2019-05-16: qty 40

## 2019-05-16 MED ORDER — CHLORHEXIDINE GLUCONATE CLOTH 2 % EX PADS: 6.0000 | MEDICATED_PAD | Freq: Once | CUTANEOUS | Status: DC

## 2019-05-16 MED ORDER — PHENYLEPHRINE 40 MCG/ML (10ML) SYRINGE FOR IV PUSH (FOR BLOOD PRESSURE SUPPORT)
PREFILLED_SYRINGE | INTRAVENOUS | Status: DC | PRN
  Administered 2019-05-16: 80 ug via INTRAVENOUS

## 2019-05-16 MED ORDER — BUPIVACAINE HCL (PF) 0.25 % IJ SOLN
INTRAMUSCULAR | Status: AC
  Filled 2019-05-16: qty 30

## 2019-05-16 MED ORDER — PHENYLEPHRINE 40 MCG/ML (10ML) SYRINGE FOR IV PUSH (FOR BLOOD PRESSURE SUPPORT)
PREFILLED_SYRINGE | INTRAVENOUS | Status: AC
  Filled 2019-05-16: qty 10

## 2019-05-16 MED ORDER — SCOPOLAMINE 1 MG/3DAYS TD PT72
MEDICATED_PATCH | TRANSDERMAL | Status: AC
  Administered 2019-05-16: 1.5 mg via TRANSDERMAL
  Filled 2019-05-16: qty 1

## 2019-05-16 MED ORDER — OXYCODONE HCL 5 MG/5ML PO SOLN: 5.0000 mg | Freq: Once | ORAL | Status: DC | PRN

## 2019-05-16 MED ORDER — SCOPOLAMINE 1 MG/3DAYS TD PT72
1.0000 | MEDICATED_PATCH | TRANSDERMAL | Status: DC
  Administered 2019-05-16: 06:00:00 1.5 mg via TRANSDERMAL

## 2019-05-16 MED ORDER — LACTATED RINGERS IV SOLN
INTRAVENOUS | Status: DC
  Administered 2019-05-16: 06:00:00 via INTRAVENOUS

## 2019-05-16 MED ORDER — LIDOCAINE 2% (20 MG/ML) 5 ML SYRINGE
INTRAMUSCULAR | Status: DC | PRN
  Administered 2019-05-16: 100 mg via INTRAVENOUS

## 2019-05-16 MED ORDER — TRAMADOL HCL 50 MG PO TABS: 50.0000 mg | ORAL_TABLET | Freq: Four times a day (QID) | ORAL | 0 refills | Status: DC | PRN

## 2019-05-16 MED ORDER — FENTANYL CITRATE (PF) 100 MCG/2ML IJ SOLN
25.0000 ug | INTRAMUSCULAR | Status: DC | PRN
  Administered 2019-05-16 (×2): 50 ug via INTRAVENOUS

## 2019-05-16 SURGICAL SUPPLY — 29 items
ATTRACTOMAT 16X20 MAGNETIC DRP (DRAPES) ×2 IMPLANT
BLADE SURG 15 STRL LF DISP TIS (BLADE) ×1 IMPLANT
BLADE SURG 15 STRL SS (BLADE) ×1
CHLORAPREP W/TINT 26 (MISCELLANEOUS) ×2 IMPLANT
CLIP VESOCCLUDE MED 6/CT (CLIP) ×4 IMPLANT
CLIP VESOCCLUDE SM WIDE 6/CT (CLIP) ×4 IMPLANT
COVER SURGICAL LIGHT HANDLE (MISCELLANEOUS) ×2 IMPLANT
COVER WAND RF STERILE (DRAPES) ×2 IMPLANT
DERMABOND ADVANCED (GAUZE/BANDAGES/DRESSINGS) ×1
DERMABOND ADVANCED .7 DNX12 (GAUZE/BANDAGES/DRESSINGS) ×1 IMPLANT
DRAPE LAPAROTOMY T 98X78 PEDS (DRAPES) ×2 IMPLANT
ELECT PENCIL ROCKER SW 15FT (MISCELLANEOUS) ×2 IMPLANT
ELECT REM PT RETURN 15FT ADLT (MISCELLANEOUS) ×2 IMPLANT
GAUZE 4X4 16PLY RFD (DISPOSABLE) ×2 IMPLANT
GLOVE SURG ORTHO 8.0 STRL STRW (GLOVE) ×2 IMPLANT
GOWN STRL REUS W/TWL XL LVL3 (GOWN DISPOSABLE) ×6 IMPLANT
HEMOSTAT SURGICEL 2X4 FIBR (HEMOSTASIS) ×2 IMPLANT
ILLUMINATOR WAVEGUIDE N/F (MISCELLANEOUS) IMPLANT
KIT BASIN OR (CUSTOM PROCEDURE TRAY) ×2 IMPLANT
KIT TURNOVER KIT A (KITS) IMPLANT
NEEDLE HYPO 25X1 1.5 SAFETY (NEEDLE) ×2 IMPLANT
PACK BASIC VI WITH GOWN DISP (CUSTOM PROCEDURE TRAY) ×2 IMPLANT
SUT MNCRL AB 4-0 PS2 18 (SUTURE) ×2 IMPLANT
SUT VIC AB 3-0 SH 18 (SUTURE) ×2 IMPLANT
SYR BULB IRRIGATION 50ML (SYRINGE) ×2 IMPLANT
SYR CONTROL 10ML LL (SYRINGE) ×2 IMPLANT
TOWEL OR 17X26 10 PK STRL BLUE (TOWEL DISPOSABLE) ×2 IMPLANT
TOWEL OR NON WOVEN STRL DISP B (DISPOSABLE) ×2 IMPLANT
TUBING CONNECTING 10 (TUBING) ×2 IMPLANT

## 2019-05-16 NOTE — Anesthesia Procedure Notes (Signed)
Procedure Name: Intubation Date/Time: 05/16/2019 7:38 AM Performed by: Lavina Hamman, CRNA Pre-anesthesia Checklist: Patient identified, Emergency Drugs available, Suction available, Patient being monitored and Timeout performed Patient Re-evaluated:Patient Re-evaluated prior to induction Oxygen Delivery Method: Circle system utilized Preoxygenation: Pre-oxygenation with 100% oxygen Induction Type: IV induction Ventilation: Mask ventilation without difficulty Laryngoscope Size: Mac and 3 Grade View: Grade I Tube type: Oral Tube size: 7.0 mm Number of attempts: 1 Airway Equipment and Method: Stylet Placement Confirmation: ETT inserted through vocal cords under direct vision,  positive ETCO2,  CO2 detector and breath sounds checked- equal and bilateral Secured at: 21 cm Tube secured with: Tape Dental Injury: Teeth and Oropharynx as per pre-operative assessment

## 2019-05-16 NOTE — Transfer of Care (Signed)
Immediate Anesthesia Transfer of Care Note  Patient: Sheryl [name]  Procedure(s) Performed: Procedure(s): LEFT PARATHYROIDECTOMY (Left)  Patient Location: PACU  Anesthesia Type:General  Level of Consciousness:  sedated, patient cooperative and responds to stimulation  Airway & Oxygen Therapy:Patient Spontanous Breathing and Patient connected to face mask oxgen  Post-op Assessment:  Report given to PACU RN and Post -op Vital signs reviewed and stable  Post vital signs:  Reviewed and stable  Last Vitals:  Vitals:   05/16/19 0558  BP: (!) 139/91  Pulse: 82  Resp: 16  Temp: 37 C  SpO2: [id number]    Complications: No apparent anesthesia complications

## 2019-05-16 NOTE — Anesthesia Postprocedure Evaluation (Signed)
Anesthesia Post Note  Patient: Sheryl [name]  Procedure(s) Performed: LEFT PARATHYROIDECTOMY (Left Neck)     Patient location during evaluation: PACU Anesthesia Type: General Level of consciousness: awake and alert Pain management: pain level controlled Vital Signs Assessment: post-procedure vital signs reviewed and stable Respiratory status: spontaneous breathing, nonlabored ventilation, respiratory function stable and patient connected to nasal cannula oxygen Cardiovascular status: blood pressure returned to baseline and stable Postop Assessment: no apparent nausea or vomiting Anesthetic complications: no    Last Vitals:  Vitals:   05/16/19 0900 05/16/19 0915  BP: (!) 131/95 123/88  Pulse: (!) 106 (!) 109  Resp: 18 17  Temp:  36.8 C  SpO2: 95% (!) 85%    Last Pain:  Vitals:   05/16/19 0915  TempSrc:   PainSc: 3                  Seletha Zimmermann S

## 2019-05-16 NOTE — Op Note (Signed)
OPERATIVE REPORT - PARATHYROIDECTOMY  Preoperative diagnosis: Primary hyperparathyroidism  Postop diagnosis: Same  Procedure: Left superior minimally invasive parathyroidectomy  Surgeon:  Armandina Gemma, MD  Anesthesia: General endotracheal  Estimated blood loss: Minimal  Preparation: ChloraPrep  Indications: Patient is referred by Dr. Vivia Ewing for surgical evaluation and management of suspected primary hyperparathyroidism. Patient had been noted several years ago to have elevated serum calcium levels. She underwent initial laboratory testing but never had any imaging studies performed. She was lost to follow-up for a few years and is now established a relationship with her endocrinologist. Recent laboratory studies show elevated calcium levels ranging from 10.5-11.2. Intact PTH levels are elevated ranging from 148-229. 24-hour urine collection for calcium is elevated at 326. 25-hydroxy vitamin D level is slightly low at 24.69. Patient is currently taking a vitamin D supplement. Patient has had no prior head or neck surgery. She has noted mild fatigue. She has a bone density scan showing osteopenia. She has had no recent fractures. She denies nephrolithiasis. She denies bone or joint pain. There is no family history of parathyroid disease or other endocrine neoplasm. Patient presents today for parathyroidectomy.  Procedure: The patient was prepared in the pre-operative holding area. The patient was brought to the operating room and placed in a supine position on the operating room table. Following administration of general anesthesia, the patient was positioned and then prepped and draped in the usual strict aseptic fashion. After ascertaining that an adequate level of anesthesia been achieved, a neck incision was made with a #15 blade. Dissection was carried through subcutaneous tissues and platysma. Hemostasis was obtained with the electrocautery. Skin flaps were developed  circumferentially and a Weitlander retractor was placed for exposure.  Strap muscles were incised in the midline. Strap muscles were reflected lateralley exposing the thyroid lobe. With gentle blunt dissection the thyroid lobe was mobilized.  Dissection was carried through adipose tissue and an enlarged parathyroid gland was identified. It was gently mobilized. Vascular structures were divided between small ligaclips. Care was taken to avoid the recurrent laryngeal nerve and the esophagus. The parathyroid gland was completely excised. It was submitted to pathology where frozen section confirmed parathyroid tissue consistent with adenoma.  Neck was irrigated with warm saline and good hemostasis was noted. Fibrillar was placed in the operative field. Strap muscles were approximated in the midline with interrupted 3-0 Vicryl sutures. Platysma was closed with interrupted 3-0 Vicryl sutures. Marcaine was infiltrated circumferentially. Skin was closed with a running 4-0 Monocryl subcuticular suture. Wound was washed and dried and Dermabond was applied. Patient was awakened from anesthesia and brought to the recovery room. The patient tolerated the procedure well.   Armandina Gemma, MD [facility] Office: [phone]

## 2019-05-16 NOTE — Addendum Note (Signed)
Addendum  created 05/16/19 1001 by Lavina Hamman, CRNA   Flowsheet accepted, Intraprocedure Flowsheets edited

## 2019-05-16 NOTE — Anesthesia Preprocedure Evaluation (Signed)
Anesthesia Evaluation  Patient identified by MRN, date of birth, ID band Patient awake    Reviewed: Allergy & Precautions, H&P , NPO status , Patient's Chart, lab work & pertinent test results  Airway Mallampati: II   Neck ROM: full    Dental   Pulmonary neg pulmonary ROS,    breath sounds clear to auscultation       Cardiovascular hypertension,  Rhythm:regular Rate:Normal     Neuro/Psych PSYCHIATRIC DISORDERS Anxiety Depression    GI/Hepatic GERD  ,  Endo/Other  Primary hyperparathyroidism. obese  Renal/GU      Musculoskeletal   Abdominal   Peds  Hematology  (+) Blood dyscrasia, anemia ,   Anesthesia Other Findings   Reproductive/Obstetrics                             Anesthesia Physical Anesthesia Plan  ASA: II  Anesthesia Plan: General   Post-op Pain Management:    Induction: Intravenous  PONV Risk Score and Plan: 3 and Ondansetron, Dexamethasone, Midazolam and Treatment may vary due to age or medical condition  Airway Management Planned: Oral ETT  Additional Equipment:   Intra-op Plan:   Post-operative Plan: Extubation in OR  Informed Consent: I have reviewed the patients History and Physical, chart, labs and discussed the procedure including the risks, benefits and alternatives for the proposed anesthesia with the patient or authorized representative who has indicated his/her understanding and acceptance.       Plan Discussed with: CRNA, Anesthesiologist and Surgeon  Anesthesia Plan Comments:         Anesthesia Quick Evaluation

## 2019-05-16 NOTE — Interval H&P Note (Signed)
History and Physical Interval Note:  05/16/2019 6:57 AM  Sheryl [name]  has presented today for surgery, with the diagnosis of PRIMARY HYPERPARATHYROIDISM.  The various methods of treatment have been discussed with the patient and family. After consideration of risks, benefits and other options for treatment, the patient has consented to    Procedure(s): LEFT PARATHYROIDECTOMY (Left) as a surgical intervention.    The patient's history has been reviewed, patient examined, no change in status, stable for surgery.  I have reviewed the patient's chart and labs.  Questions were answered to the patient's satisfaction.    Armandina Gemma, [facility] Office: [place]

## 2019-05-17 ENCOUNTER — Encounter (HOSPITAL_COMMUNITY): Payer: Self-pay | Admitting: Surgery

## 2019-05-18 ENCOUNTER — Other Ambulatory Visit: Payer: Self-pay | Admitting: Family Medicine

## 2019-05-19 LAB — SURGICAL PATHOLOGY

## 2019-05-21 ENCOUNTER — Other Ambulatory Visit: Payer: Self-pay | Admitting: Family Medicine

## 2019-05-28 ENCOUNTER — Encounter: Payer: Self-pay | Admitting: *Deleted

## 2019-06-09 ENCOUNTER — Other Ambulatory Visit: Payer: Self-pay

## 2019-06-11 ENCOUNTER — Ambulatory Visit: Payer: 59 | Admitting: Internal Medicine

## 2019-06-18 ENCOUNTER — Other Ambulatory Visit: Payer: Self-pay

## 2019-06-18 ENCOUNTER — Ambulatory Visit: Payer: 59 | Admitting: Internal Medicine

## 2019-06-18 ENCOUNTER — Encounter: Payer: Self-pay | Admitting: Internal Medicine

## 2019-06-18 VITALS — BP 124/92 | HR 79 | Resp 16 | Ht 61.0 in | Wt 169.6 lb

## 2019-06-18 DIAGNOSIS — E213 Hyperparathyroidism, unspecified: Secondary | ICD-10-CM

## 2019-06-18 DIAGNOSIS — E559 Vitamin D deficiency, unspecified: Secondary | ICD-10-CM | POA: Diagnosis not present

## 2019-06-18 NOTE — Patient Instructions (Signed)
-   I am glad your calcium has normalized. - Please continue to take over the counter Vitamin D3 1000 iu daily

## 2019-06-18 NOTE — Progress Notes (Signed)
Name: Sheryl [name]  MRN/ DOB: [id number], [birth year]    Age/ Sex: 58 y.o., female     PCP: Mosie Lukes, MD   Reason for Endocrinology Evaluation: Hyperparathyroidism      Initial Endocrinology Clinic Visit: 09/18/2018    PATIENT IDENTIFIER: Sheryl [name] is a 58 y.o., female with a past medical history of hyperparathyroidism. She has followed with [facility] since 09/18/2018 for consultative assistance with management of her hyperparathyroidism.   HISTORICAL SUMMARY: The patient was first diagnosed with hypercalcemia in 2013, at which time it was found on routine work up . She used to see Dr. Dwyane Dee but was lost to follow up from 2016 until re-establishing at our office in 08/2018.  She was found to have a 24-hr urine calcium excretion of > 300 .  She is S/P parathyroidectomy 05/16/2019 of the left superior gland.   SUBJECTIVE:   During last visit (09/18/2018): She was asymptomatic . Labs stable, increase Vitamin D to 2000 iu daily   Today (06/18/2019):  Sheryl [name] is here for a 6 month follow up on hyperparathyroidism. She is S/P left superior parathyroidectomy on 05/16/2019 .  She feels more energetic , denies constipation or diarrhea Denies tingling and numbness    ROS:  As per HPI.   HISTORY:  Past Medical History:  Past Medical History:  Diagnosis Date  . Anemia   . Anxiety   . Anxiety and depression    Follows with Dr Gala Murdoch q 6 months Has taken Zoloft in past without good results   . Chicken pox as a child  . Depression   . Elevated BP 02/06/2012  . FH: colon cancer 06/07/2011  . GERD (gastroesophageal reflux disease)   . 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 Hyperlipidemia 05/08/2012  . Hypertension   . Osteopenia 02/27/2017  . Overweight(278.02)   . Preventative health care 06/07/2011  . Sacral pain 05/08/2012  . Tachycardia 11/10/2014  . Vitamin D deficiency 05/09/2014   Past Surgical History:  Past Surgical  History:  Procedure Laterality Date  . [place]   X 2  . COLONOSCOPY    . GUM SURGERY  2007  . PARATHYROIDECTOMY Left 05/16/2019   Procedure: LEFT PARATHYROIDECTOMY;  Surgeon: Armandina Gemma, MD;  Location: [facility];  Service: General;  Laterality: Left;  . right hand surgery  2009   infection debrided    Social History:  reports that she has never smoked. She has never used smokeless tobacco. She reports current alcohol use of about 35.0 standard drinks of alcohol per week. She reports that she does not use drugs. Family History:  Family History  Problem Relation Age of Onset  . Heart attack Mother   . Diabetes Mother        type 2  . Hypertension Mother   . Hyperlipidemia Mother   . Heart disease Mother   . Cancer Father        colon  . Heart attack Father   . Colon cancer Father   . Hypertension Sister   . Diabetes Sister        type 2  . Hyperlipidemia Sister   . Heart disease Sister   . Hypertension Sister   . Arthritis Sister   . Diabetes Maternal Grandmother   . Hypertension Maternal Grandmother   . Hyperlipidemia Maternal Grandmother      HOME MEDICATIONS: Allergies as of 06/18/2019      Reactions  Penicillins Rash   Did it involve swelling of the face/tongue/throat, SOB, or low BP? No Did it involve sudden or severe rash/hives, skin peeling, or any reaction on the inside of your mouth or nose? No Did you need to seek medical attention at a hospital or doctor's office? No When did it last happen?childhood allergy If all above answers are "NO", may proceed with cephalosporin use.      Medication List       Accurate as of June 18, 2019  1:59 PM. If you have any questions, ask your nurse or doctor.        cholecalciferol 25 MCG (1000 UT) tablet Commonly known as: VITAMIN D3 Take 1,000 Units by mouth daily.   ergocalciferol 1.25 MG (50000 UT) capsule Commonly known as: Drisdol Take 1 capsule (50,000 Units total) by mouth  once a week.   metoprolol succinate 50 MG 24 hr tablet Commonly known as: TOPROL-XL TAKE 1 TABLET BY MOUTH DAILY WITH OR IMMEDIATELY FOLLOWING A MEAL   pantoprazole 40 MG tablet Commonly known as: PROTONIX TAKE 1 TABLET BY MOUTH DAILY AS NEEDED What changed:   how much to take  how to take this  when to take this  reasons to take this  additional instructions   sertraline 100 MG tablet Commonly known as: ZOLOFT TAKE 1.5 TABLETS BY MOUTH DAILY   traMADol 50 MG tablet Commonly known as: ULTRAM Take 1-2 tablets (50-100 mg total) by mouth every 6 (six) hours as needed.         OBJECTIVE:   PHYSICAL EXAM: VS: BP (!) 124/92   Pulse 79   Resp 16   Ht 5\' 1"  (1.549 m)   Wt 169 lb 9.6 oz (76.9 kg)   LMP 09/08/2011   SpO2 99%   BMI 32.05 kg/m    EXAM: General: Pt appears well and is in NAD  Neck: General: Supple without adenopathy. Thyroid: Thyroid size normal.  No goiter or nodules appreciated. No thyroid bruit. Left anterior neck incision with localized swelling   Lungs: Clear with good BS bilat with no rales, rhonchi, or wheezes  Heart: Auscultation: RRR.  Abdomen: Normoactive bowel sounds, soft, nontender, without masses or organomegaly palpable  Extremities:  BL LE: No pretibial edema normal ROM and strength.  Mental Status: Judgment, insight: Intact Orientation: Oriented to time, place, and person Mood and affect: No depression, anxiety, or agitation     DATA REVIEWED: 06/09/2019  Calcium 9.4  PTH 61 pg/mL    ASSESSMENT / PLAN / RECOMMENDATIONS:    1. Primary Hyperparathyroidism, S/P Left parathyroidectomy 05/16/19   - She does have swelling around the incision area but otherwise she is not having any other complaints. She has labs done 2 weeks ago with Calcium of 9.4 mg/dL and PTH of 61 pg/mL  - I congratulated her on the great recovery and resolution of hypercalcemia - No further endocrinology work up is needed   2.  Vitamin D  insufficiency:  - Pt to restart Vitamin D3 1000 iu daily      F/u PRN   Signed electronically by: Mack Guise, MD  [facility]  [address] [place], [place] [address] Phone: [phone] FAX: [phone]      CC: Mosie Lukes, [address] [place] [address] Phone: [phone]  Fax: [phone]   Return to Endocrinology clinic as below: Future Appointments  Date Time Provider [place]  09/11/2019  10:00 AM Mosie Lukes, MD [id number]

## 2019-09-10 ENCOUNTER — Other Ambulatory Visit: Payer: Self-pay

## 2019-09-11 ENCOUNTER — Ambulatory Visit: Payer: 59 | Admitting: Family Medicine

## 2019-09-11 VITALS — BP 136/94 | HR 88 | Temp 97.3°F | Resp 16 | Ht 61.0 in | Wt 169.0 lb

## 2019-09-11 DIAGNOSIS — R945 Abnormal results of liver function studies: Secondary | ICD-10-CM

## 2019-09-11 DIAGNOSIS — M8580 Other specified disorders of bone density and structure, unspecified site: Secondary | ICD-10-CM

## 2019-09-11 DIAGNOSIS — E559 Vitamin D deficiency, unspecified: Secondary | ICD-10-CM

## 2019-09-11 DIAGNOSIS — Q383 Other congenital malformations of tongue: Secondary | ICD-10-CM

## 2019-09-11 DIAGNOSIS — E782 Mixed hyperlipidemia: Secondary | ICD-10-CM

## 2019-09-11 DIAGNOSIS — R7989 Other specified abnormal findings of blood chemistry: Secondary | ICD-10-CM

## 2019-09-11 DIAGNOSIS — F329 Major depressive disorder, single episode, unspecified: Secondary | ICD-10-CM

## 2019-09-11 DIAGNOSIS — D649 Anemia, unspecified: Secondary | ICD-10-CM | POA: Diagnosis not present

## 2019-09-11 DIAGNOSIS — F32A Depression, unspecified: Secondary | ICD-10-CM

## 2019-09-11 DIAGNOSIS — F419 Anxiety disorder, unspecified: Secondary | ICD-10-CM | POA: Diagnosis not present

## 2019-09-11 DIAGNOSIS — R739 Hyperglycemia, unspecified: Secondary | ICD-10-CM

## 2019-09-11 DIAGNOSIS — E213 Hyperparathyroidism, unspecified: Secondary | ICD-10-CM

## 2019-09-11 LAB — CBC
HCT: 44.9 % (ref 36.0–46.0)
Hemoglobin: 15.1 g/dL — ABNORMAL HIGH (ref 12.0–15.0)
MCHC: 33.6 g/dL (ref 30.0–36.0)
MCV: 90.3 fl (ref 78.0–100.0)
Platelets: 136 10*3/uL — ABNORMAL LOW (ref 150.0–400.0)
RBC: 4.98 Mil/uL (ref 3.87–5.11)
RDW: 13.5 % (ref 11.5–15.5)
WBC: 4 10*3/uL (ref 4.0–10.5)

## 2019-09-11 LAB — LIPID PANEL
Cholesterol: 243 mg/dL — ABNORMAL HIGH (ref 0–200)
HDL: 120.3 mg/dL
LDL Cholesterol: 109 mg/dL — ABNORMAL HIGH (ref 0–99)
NonHDL: 122.62
Total CHOL/HDL Ratio: 2
Triglycerides: 69 mg/dL (ref 0.0–149.0)
VLDL: 13.8 mg/dL (ref 0.0–40.0)

## 2019-09-11 LAB — VITAMIN D 25 HYDROXY (VIT D DEFICIENCY, FRACTURES): VITD: 23.28 ng/mL — ABNORMAL LOW (ref 30.00–100.00)

## 2019-09-11 LAB — COMPREHENSIVE METABOLIC PANEL
ALT: 21 U/L (ref 0–35)
AST: 26 U/L (ref 0–37)
Albumin: 4.4 g/dL (ref 3.5–5.2)
Alkaline Phosphatase: 71 U/L (ref 39–117)
BUN: 19 mg/dL (ref 6–23)
CO2: 27 mEq/L (ref 19–32)
Calcium: 9.2 mg/dL (ref 8.4–10.5)
Chloride: 105 mEq/L (ref 96–112)
Creatinine, Ser: 0.78 mg/dL (ref 0.40–1.20)
GFR: 76.29 mL/min (ref >60.00)
Glucose, Bld: 87 mg/dL (ref 70–99)
Potassium: 4.6 mEq/L (ref 3.5–5.1)
Sodium: 139 mEq/L (ref 135–145)
Total Bilirubin: 0.9 mg/dL (ref 0.2–1.2)
Total Protein: 6.9 g/dL (ref 6.0–8.3)

## 2019-09-11 LAB — HEMOGLOBIN A1C: Hgb A1c MFr Bld: 5.1 % (ref 4.6–6.5)

## 2019-09-11 LAB — TSH: TSH: 4.24 u[IU]/mL (ref 0.35–4.50)

## 2019-09-11 NOTE — Assessment & Plan Note (Signed)
Continue to monitor for recurrence

## 2019-09-11 NOTE — Assessment & Plan Note (Signed)
Encouraged to get adequate exercise, calcium and vitamin d intake 

## 2019-09-11 NOTE — Assessment & Plan Note (Signed)
Tolerated surgery well and has healed nicely

## 2019-09-11 NOTE — Progress Notes (Signed)
Subjective:    Patient ID: Sheryl [name], female    DOB: Dec 24, 1960, 59 y.o.   MRN: [id number]  Chief Complaint  Patient presents with  . Follow-up  . Depression    doing well on Zoloft    HPI Patient is in today for follow up on chronic medical concerns. No recent febrile illness or hospitalizations. She reports her sertraline is helping and she feels well. She is maintaining quarantine well. She is doing well but not always eating well. Denies CP/palp/SOB/HA/congestion/fevers/GI or GU c/o. Taking meds as prescribed. She has an itchy spot on her tongue that comes and goes and has had it for years.   Past Medical History:  Diagnosis Date  . Anemia   . Anxiety   . Anxiety and depression    Follows with Dr Gala Murdoch q 6 months Has taken Zoloft in past without good results   . Chicken pox as a child  . Depression   . Elevated BP 02/06/2012  . FH: colon cancer 06/07/2011  . GERD (gastroesophageal reflux disease)   . 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 Hyperlipidemia 05/08/2012  . Hypertension   . Osteopenia 02/27/2017  . Overweight(278.02)   . Preventative health care 06/07/2011  . Sacral pain 05/08/2012  . Tachycardia 11/10/2014  . Vitamin D deficiency 05/09/2014    Past Surgical History:  Procedure Laterality Date  . [place]   X 2  . COLONOSCOPY    . GUM SURGERY  2007  . PARATHYROIDECTOMY Left 05/16/2019   Procedure: LEFT PARATHYROIDECTOMY;  Surgeon: Armandina Gemma, MD;  Location: [facility];  Service: General;  Laterality: Left;  . right hand surgery  2009   infection debrided    Family History  Problem Relation Age of Onset  . Heart attack Mother   . Diabetes Mother        type 2  . Hypertension Mother   . Hyperlipidemia Mother   . Heart disease Mother   . Cancer Father        colon  . Heart attack Father   . Colon cancer Father   . Hypertension Sister   . Diabetes Sister        type 2  . Hyperlipidemia Sister   .  Heart disease Sister   . Hypertension Sister   . Arthritis Sister   . Diabetes Maternal Grandmother   . Hypertension Maternal Grandmother   . Hyperlipidemia Maternal Grandmother     Social History   Socioeconomic History  . Marital status: Married    Spouse name: Not on file  . Number of children: Not on file  . Years of education: Not on file  . Highest education level: Not on file  Occupational History  . Not on file  Tobacco Use  . Smoking status: Never Smoker  . Smokeless tobacco: Never Used  Substance and Sexual Activity  . Alcohol use: Yes    Alcohol/week: 35.0 standard drinks    Types: 21 Glasses of wine, 14 Standard drinks or equivalent per week    Comment: glass of wine nightly  . Drug use: No  . Sexual activity: Yes    Partners: Male    Comment: no dietary restrictions, lives with husband  Other Topics Concern  . Not on file  Social History Narrative  . Not on file   Social Determinants of Health   Financial Resource Strain:   . Difficulty of Paying Living Expenses: Not on file  Food Insecurity:   . Worried About Charity fundraiser in the Last Year: Not on file  . Ran Out of Food in the Last Year: Not on file  Transportation Needs:   . Lack of Transportation (Medical): Not on file  . Lack of Transportation (Non-Medical): Not on file  Physical Activity:   . Days of Exercise per Week: Not on file  . Minutes of Exercise per Session: Not on file  Stress:   . Feeling of Stress : Not on file  Social Connections:   . Frequency of Communication with Friends and Family: Not on file  . Frequency of Social Gatherings with Friends and Family: Not on file  . Attends Religious Services: Not on file  . Active Member of Clubs or Organizations: Not on file  . Attends Archivist Meetings: Not on file  . Marital Status: Not on file  Intimate Partner Violence:   . Fear of Current or Ex-Partner: Not on file  . Emotionally Abused: Not on file  . Physically  Abused: Not on file  . Sexually Abused: Not on file    Outpatient Medications Prior to Visit  Medication Sig Dispense Refill  . cholecalciferol (VITAMIN D3) 25 MCG (1000 UT) tablet Take 1,000 Units by mouth daily.    . ergocalciferol (DRISDOL) 50000 units capsule Take 1 capsule (50,000 Units total) by mouth once a week. 4 capsule 4  . metoprolol succinate (TOPROL-XL) 50 MG 24 hr tablet TAKE 1 TABLET BY MOUTH DAILY WITH OR IMMEDIATELY FOLLOWING A MEAL 90 tablet 1  . pantoprazole (PROTONIX) 40 MG tablet TAKE 1 TABLET BY MOUTH DAILY AS NEEDED (Patient taking differently: Take 40 mg by mouth daily as needed (acid reflux). ) 90 tablet 1  . sertraline (ZOLOFT) 100 MG tablet TAKE 1.5 TABLETS BY MOUTH DAILY 135 tablet 1  . traMADol (ULTRAM) 50 MG tablet Take 1-2 tablets (50-100 mg total) by mouth every 6 (six) hours as needed. 15 tablet 0   No facility-administered medications prior to visit.    Allergies  Allergen Reactions  . Penicillins Rash    Did it involve swelling of the face/tongue/throat, SOB, or low BP? No Did it involve sudden or severe rash/hives, skin peeling, or any reaction on the inside of your mouth or nose? No Did you need to seek medical attention at a hospital or doctor's office? No When did it last happen?childhood allergy If all above answers are "NO", may proceed with cephalosporin use.     Review of Systems  Constitutional: Negative for fever and malaise/fatigue.  HENT: Negative for congestion.   Eyes: Negative for blurred vision.  Respiratory: Negative for shortness of breath.   Cardiovascular: Negative for chest pain, palpitations and leg swelling.  Gastrointestinal: Negative for abdominal pain, blood in stool and nausea.  Genitourinary: Negative for dysuria and frequency.  Musculoskeletal: Negative for falls.  Skin: Negative for rash.  Neurological: Negative for dizziness, loss of consciousness and headaches.  Endo/Heme/Allergies: Negative for  environmental allergies.  Psychiatric/Behavioral: Negative for depression. The patient is not nervous/anxious.        Objective:    Physical Exam Vitals and nursing note reviewed.  Constitutional:      General: She is not in acute distress.    Appearance: She is well-developed.  HENT:     Head: Normocephalic and atraumatic.     Nose: Nose normal.  Eyes:     General:        Right eye: No discharge.  Left eye: No discharge.  Cardiovascular:     Rate and Rhythm: Normal rate and regular rhythm.     Heart sounds: No murmur.  Pulmonary:     Effort: Pulmonary effort is normal.     Breath sounds: Normal breath sounds.  Abdominal:     General: Bowel sounds are normal.     Palpations: Abdomen is soft.     Tenderness: There is no abdominal tenderness.  Musculoskeletal:     Cervical back: Normal range of motion and neck supple.  Skin:    General: Skin is warm and dry.  Neurological:     Mental Status: She is alert and oriented to person, place, and time.     BP (!) 136/94 (BP Location: Left Arm, Patient Position: Sitting, Cuff Size: Small)   Pulse 88   Temp (!) 97.3 F (36.3 C) (Temporal)   Resp 16   Ht 5\' 1"  (1.549 m)   Wt 169 lb (76.7 kg)   LMP 09/08/2011   SpO2 99%   BMI 31.93 kg/m  Wt Readings from Last 3 Encounters:  09/11/19 169 lb (76.7 kg)  06/18/19 169 lb 9.6 oz (76.9 kg)  05/16/19 170 lb (77.1 kg)    Diabetic Foot Exam - Simple   No data filed     Lab Results  Component Value Date   WBC 4.0 09/11/2019   HGB 15.1 (H) 09/11/2019   HCT 44.9 09/11/2019   PLT 136.0 (L) 09/11/2019   GLUCOSE 87 09/11/2019   CHOL 243 (H) 09/11/2019   TRIG 69.0 09/11/2019   HDL 120.30 09/11/2019   LDLDIRECT 103.8 01/10/2013   LDLCALC 109 (H) 09/11/2019   ALT 21 09/11/2019   AST 26 09/11/2019   NA 139 09/11/2019   K 4.6 09/11/2019   CL 105 09/11/2019   CREATININE 0.78 09/11/2019   BUN 19 09/11/2019   CO2 27 09/11/2019   TSH 4.24 09/11/2019   HGBA1C 5.1  09/11/2019    Lab Results  Component Value Date   TSH 4.24 09/11/2019   Lab Results  Component Value Date   WBC 4.0 09/11/2019   HGB 15.1 (H) 09/11/2019   HCT 44.9 09/11/2019   MCV 90.3 09/11/2019   PLT 136.0 (L) 09/11/2019   Lab Results  Component Value Date   NA 139 09/11/2019   K 4.6 09/11/2019   CO2 27 09/11/2019   GLUCOSE 87 09/11/2019   BUN 19 09/11/2019   CREATININE 0.78 09/11/2019   BILITOT 0.9 09/11/2019   ALKPHOS 71 09/11/2019   AST 26 09/11/2019   ALT 21 09/11/2019   PROT 6.9 09/11/2019   ALBUMIN 4.4 09/11/2019   CALCIUM 9.2 09/11/2019   ANIONGAP 8 05/13/2019   GFR 76.29 09/11/2019   Lab Results  Component Value Date   CHOL 243 (H) 09/11/2019   Lab Results  Component Value Date   HDL 120.30 09/11/2019   Lab Results  Component Value Date   LDLCALC 109 (H) 09/11/2019   Lab Results  Component Value Date   TRIG 69.0 09/11/2019   Lab Results  Component Value Date   CHOLHDL 2 09/11/2019   Lab Results  Component Value Date   HGBA1C 5.1 09/11/2019       Assessment & Plan:   Problem List Items Addressed This Visit    Anxiety and depression - Primary    Doing well on Sertraline despite the pandemic, no changes      Anemia    Continue to monitor for recurrence  Relevant Orders   CBC (Completed)   Hyperlipidemia    Encouraged heart healthy diet, increase exercise, avoid trans fats, consider a krill oil cap daily      Relevant Orders   Lipid panel (Completed)   Abnormal liver function test    Check cmp today      Vitamin D insufficiency    Supplement and monitor      Relevant Orders   Comprehensive metabolic panel (Completed)   VITAMIN D 25 Hydroxy (Vit-D Deficiency, Fractures) (Completed)   Osteopenia    Encouraged to get adequate exercise, calcium and vitamin d intake      Hyperparathyroidism ([place])    Tolerated surgery well and has healed nicely      Relevant Orders   TSH (Completed)   Tongue anomaly    She notes  she has had a spot on her tongue for years now that is intermittently itchy. She cannot correlate with certain foods. She will discuss with her dentist and if she notes any new or bleeding lesions she will bring it to our attention for further evaluation      Hyperglycemia    hgba1c acceptable, minimize simple carbs. Increase exercise as tolerated.       Relevant Orders   Hemoglobin A1c (Completed)      I have discontinued Mills Koller. Percle "Kim"'s traMADol. I am also having her maintain her ergocalciferol, pantoprazole, cholecalciferol, metoprolol succinate, and sertraline.  No orders of the defined types were placed in this encounter.    Penni Homans, MD

## 2019-09-11 NOTE — Assessment & Plan Note (Signed)
Encouraged heart healthy diet, increase exercise, avoid trans fats, consider a krill oil cap daily 

## 2019-09-11 NOTE — Assessment & Plan Note (Signed)
Supplement and monitor 

## 2019-09-11 NOTE — Patient Instructions (Signed)
Omron Blood Pressure cuff, upper arm, want BP 100-140/60-90 Pulse oximeter, want oxygen in 90s  Weekly vitals  Take Multivitamin with minerals, selenium Vitamin D 1000-2000 IU daily Probiotic with lactobacillus and bifidophilus Asprin EC 81 mg daily  Melatonin 2-5 mg at bedtime  [email or web address] 

## 2019-09-11 NOTE — Assessment & Plan Note (Signed)
hgba1c acceptable, minimize simple carbs. Increase exercise as tolerated.  

## 2019-09-11 NOTE — Assessment & Plan Note (Signed)
She notes she has had a spot on her tongue for years now that is intermittently itchy. She cannot correlate with certain foods. She will discuss with her dentist and if she notes any new or bleeding lesions she will bring it to our attention for further evaluation

## 2019-09-11 NOTE — Assessment & Plan Note (Signed)
Check cmp today 

## 2019-09-11 NOTE — Assessment & Plan Note (Signed)
Doing well on Sertraline despite the pandemic, no changes

## 2019-09-12 ENCOUNTER — Telehealth: Payer: Self-pay

## 2019-09-12 DIAGNOSIS — E782 Mixed hyperlipidemia: Secondary | ICD-10-CM

## 2019-09-12 MED ORDER — ATORVASTATIN CALCIUM 10 MG PO TABS: 10.0000 mg | ORAL_TABLET | Freq: Every day | ORAL | 3 refills | Status: DC

## 2019-09-12 NOTE — Telephone Encounter (Signed)
Patient notified of results and recommendations, verbalized understanding.  Lab and f/u appts scheduled. Patient already has Vitamin D 50,000 IU caps at home (not sent to pharmacy). Atorvastatin Rx sent to pharmacy.

## 2019-09-12 NOTE — Telephone Encounter (Signed)
-----   Message from Mosie Lukes, MD sent at 09/11/2019  5:12 PM EST ----- Notify labs show low vitamin D again. Labs reveal deficiency. Start on Vitamin D 50000 IU caps, 1 cap po weekly x 12 weeks. Disp #4 with 4 rf. Also take daily Vitamin D over the counter. If already taking a daily supplement increase by 1000 IU daily and if not start Vitamin D 2000 IU daily.  Also cholesterol is worse would like to start a cholesterol med. Atorvastatin 10 mg po qhs disp #30 with 3 rf if she is willing and then recheck labs in 3 months and schedule an appt after that to discuss options

## 2019-11-14 ENCOUNTER — Other Ambulatory Visit: Payer: Self-pay | Admitting: Family Medicine

## 2019-12-04 ENCOUNTER — Other Ambulatory Visit (INDEPENDENT_AMBULATORY_CARE_PROVIDER_SITE_OTHER): Payer: 59

## 2019-12-04 ENCOUNTER — Other Ambulatory Visit: Payer: Self-pay

## 2019-12-04 DIAGNOSIS — E782 Mixed hyperlipidemia: Secondary | ICD-10-CM | POA: Diagnosis not present

## 2019-12-04 LAB — LIPID PANEL
Cholesterol: 237 mg/dL — ABNORMAL HIGH (ref 0–200)
HDL: 105.9 mg/dL (ref >39.00)
LDL Cholesterol: 105 mg/dL — ABNORMAL HIGH (ref 0–99)
NonHDL: 130.82
Total CHOL/HDL Ratio: 2
Triglycerides: 127 mg/dL (ref 0.0–149.0)
VLDL: 25.4 mg/dL (ref 0.0–40.0)

## 2019-12-06 ENCOUNTER — Other Ambulatory Visit: Payer: Self-pay | Admitting: Family Medicine

## 2019-12-06 DIAGNOSIS — E782 Mixed hyperlipidemia: Secondary | ICD-10-CM

## 2019-12-12 ENCOUNTER — Other Ambulatory Visit: Payer: Self-pay

## 2019-12-12 ENCOUNTER — Telehealth: Payer: 59 | Admitting: Family Medicine

## 2019-12-14 NOTE — Progress Notes (Signed)
Patient not seen.

## 2019-12-27 ENCOUNTER — Other Ambulatory Visit: Payer: Self-pay | Admitting: Family Medicine

## 2020-03-15 ENCOUNTER — Ambulatory Visit: Payer: 59 | Admitting: Family Medicine

## 2020-04-01 ENCOUNTER — Other Ambulatory Visit: Payer: Self-pay

## 2020-04-01 ENCOUNTER — Ambulatory Visit: Payer: 59 | Admitting: Family Medicine

## 2020-04-01 VITALS — BP 120/72 | HR 79 | Temp 98.2°F | Resp 13 | Ht 61.0 in | Wt 171.8 lb

## 2020-04-01 DIAGNOSIS — E782 Mixed hyperlipidemia: Secondary | ICD-10-CM | POA: Diagnosis not present

## 2020-04-01 DIAGNOSIS — E559 Vitamin D deficiency, unspecified: Secondary | ICD-10-CM | POA: Diagnosis not present

## 2020-04-01 DIAGNOSIS — R350 Frequency of micturition: Secondary | ICD-10-CM

## 2020-04-01 DIAGNOSIS — R739 Hyperglycemia, unspecified: Secondary | ICD-10-CM | POA: Diagnosis not present

## 2020-04-01 DIAGNOSIS — D649 Anemia, unspecified: Secondary | ICD-10-CM

## 2020-04-01 DIAGNOSIS — N390 Urinary tract infection, site not specified: Secondary | ICD-10-CM

## 2020-04-01 LAB — POCT URINALYSIS DIP (MANUAL ENTRY)
Blood, UA: NEGATIVE
Glucose, UA: NEGATIVE mg/dL
Ketones, POC UA: NEGATIVE mg/dL
Leukocytes, UA: NEGATIVE
Nitrite, UA: NEGATIVE
Protein Ur, POC: NEGATIVE mg/dL
Spec Grav, UA: 1.02 (ref 1.010–1.025)
Urobilinogen, UA: 0.2 E.U./dL
pH, UA: 5.5 (ref 5.0–8.0)

## 2020-04-01 MED ORDER — NITROFURANTOIN MONOHYD MACRO 100 MG PO CAPS: 100.0000 mg | ORAL_CAPSULE | Freq: Two times a day (BID) | ORAL | 0 refills | Status: DC

## 2020-04-01 NOTE — Patient Instructions (Signed)
Cranberry tabs by AZO and a probiotics with increased water   Urinary Tract Infection, Adult  A urinary tract infection (UTI) is an infection of any part of the urinary tract. The urinary tract includes the kidneys, ureters, bladder, and urethra. These organs make, store, and get rid of urine in the body. Your health care provider may use other names to describe the infection. An upper UTI affects the ureters and kidneys (pyelonephritis). A lower UTI affects the bladder (cystitis) and urethra (urethritis). What are the causes? Most urinary tract infections are caused by bacteria in your genital area, around the entrance to your urinary tract (urethra). These bacteria grow and cause inflammation of your urinary tract. What increases the risk? You are more likely to develop this condition if:  You have a urinary catheter that stays in place (indwelling).  You are not able to control when you urinate or have a bowel movement (you have incontinence).  You are female and you: ? Use a spermicide or diaphragm for birth control. ? Have low estrogen levels. ? Are pregnant.  You have certain genes that increase your risk (genetics).  You are sexually active.  You take antibiotic medicines.  You have a condition that causes your flow of urine to slow down, such as: ? An enlarged prostate, if you are female. ? Blockage in your urethra (stricture). ? A kidney stone. ? A nerve condition that affects your bladder control (neurogenic bladder). ? Not getting enough to drink, or not urinating often.  You have certain medical conditions, such as: ? Diabetes. ? A weak disease-fighting system (immunesystem). ? Sickle cell disease. ? Gout. ? Spinal cord injury. What are the signs or symptoms? Symptoms of this condition include:  Needing to urinate right away (urgently).  Frequent urination or passing small amounts of urine frequently.  Pain or burning with urination.  Blood in the  urine.  Urine that smells bad or unusual.  Trouble urinating.  Cloudy urine.  Vaginal discharge, if you are female.  Pain in the abdomen or the lower back. You may also have:  Vomiting or a decreased appetite.  Confusion.  Irritability or tiredness.  A fever.  Diarrhea. The first symptom in older adults may be confusion. In some cases, they may not have any symptoms until the infection has worsened. How is this diagnosed? This condition is diagnosed based on your medical history and a physical exam. You may also have other tests, including:  Urine tests.  Blood tests.  Tests for sexually transmitted infections (STIs). If you have had more than one UTI, a cystoscopy or imaging studies may be done to determine the cause of the infections. How is this treated? Treatment for this condition includes:  Antibiotic medicine.  Over-the-counter medicines to treat discomfort.  Drinking enough water to stay hydrated. If you have frequent infections or have other conditions such as a kidney stone, you may need to see a health care provider who specializes in the urinary tract (urologist). In rare cases, urinary tract infections can cause sepsis. Sepsis is a life-threatening condition that occurs when the body responds to an infection. Sepsis is treated in the hospital with IV antibiotics, fluids, and other medicines. Follow these instructions at home:  Medicines  Take over-the-counter and prescription medicines only as told by your health care provider.  If you were prescribed an antibiotic medicine, take it as told by your health care provider. Do not stop using the antibiotic even if you start to feel better.  General instructions  Make sure you: ? Empty your bladder often and completely. Do not hold urine for long periods of time. ? Empty your bladder after sex. ? Wipe from front to back after a bowel movement if you are female. Use each tissue one time when you  wipe.  Drink enough fluid to keep your urine pale yellow.  Keep all follow-up visits as told by your health care provider. This is important. Contact a health care provider if:  Your symptoms do not get better after 1-2 days.  Your symptoms go away and then return. Get help right away if you have:  Severe pain in your back or your lower abdomen.  A fever.  Nausea or vomiting. Summary  A urinary tract infection (UTI) is an infection of any part of the urinary tract, which includes the kidneys, ureters, bladder, and urethra.  Most urinary tract infections are caused by bacteria in your genital area, around the entrance to your urinary tract (urethra).  Treatment for this condition often includes antibiotic medicines.  If you were prescribed an antibiotic medicine, take it as told by your health care provider. Do not stop using the antibiotic even if you start to feel better.  Keep all follow-up visits as told by your health care provider. This is important. This information is not intended to replace advice given to you by your health care provider. Make sure you discuss any questions you have with your health care provider. Document Revised: 07/04/2018 Document Reviewed: 01/24/2018 Elsevier Patient Education  2020 [employer].

## 2020-04-02 LAB — COMPREHENSIVE METABOLIC PANEL
AG Ratio: 1.6 (calc) (ref 1.0–2.5)
ALT: 19 U/L (ref 6–29)
AST: 26 U/L (ref 10–35)
Albumin: 4.4 g/dL (ref 3.6–5.1)
Alkaline phosphatase (APISO): 86 U/L (ref 37–153)
BUN: 20 mg/dL (ref 7–25)
CO2: 24 mmol/L (ref 20–32)
Calcium: 9.8 mg/dL (ref 8.6–10.4)
Chloride: 106 mmol/L (ref 98–110)
Creat: 0.89 mg/dL (ref 0.50–1.05)
Globulin: 2.8 g/dL (calc) (ref 1.9–3.7)
Glucose, Bld: 98 mg/dL (ref 65–99)
Potassium: 4.7 mmol/L (ref 3.5–5.3)
Sodium: 141 mmol/L (ref 135–146)
Total Bilirubin: 0.7 mg/dL (ref 0.2–1.2)
Total Protein: 7.2 g/dL (ref 6.1–8.1)

## 2020-04-02 LAB — VITAMIN D 25 HYDROXY (VIT D DEFICIENCY, FRACTURES): Vit D, 25-Hydroxy: 40 ng/mL (ref 30–100)

## 2020-04-02 LAB — LIPID PANEL
Cholesterol: 234 mg/dL — ABNORMAL HIGH (ref <200)
HDL: 119 mg/dL (ref >=50)
LDL Cholesterol (Calc): 96 mg/dL (calc)
Non-HDL Cholesterol (Calc): 115 mg/dL (calc) (ref <130)
Total CHOL/HDL Ratio: 2 (calc) (ref <5.0)
Triglycerides: 99 mg/dL (ref <150)

## 2020-04-02 LAB — CBC
HCT: 46.9 % — ABNORMAL HIGH (ref 35.0–45.0)
Hemoglobin: 16.1 g/dL — ABNORMAL HIGH (ref 11.7–15.5)
MCH: 31.3 pg (ref 27.0–33.0)
MCHC: 34.3 g/dL (ref 32.0–36.0)
MCV: 91.1 fL (ref 80.0–100.0)
MPV: 10 fL (ref 7.5–12.5)
Platelets: 205 10*3/uL (ref 140–400)
RBC: 5.15 10*6/uL — ABNORMAL HIGH (ref 3.80–5.10)
RDW: 13.4 % (ref 11.0–15.0)
WBC: 5.6 10*3/uL (ref 3.8–10.8)

## 2020-04-02 LAB — HEMOGLOBIN A1C
Hgb A1c MFr Bld: 4.9 % of total Hgb (ref <5.7)
Mean Plasma Glucose: 94 (calc)
eAG (mmol/L): 5.2 (calc)

## 2020-04-02 LAB — TSH: TSH: 4.03 mIU/L (ref 0.40–4.50)

## 2020-04-03 LAB — URINE CULTURE
MICRO NUMBER:: 10905005
SPECIMEN QUALITY:: ADEQUATE

## 2020-04-06 DIAGNOSIS — N390 Urinary tract infection, site not specified: Secondary | ICD-10-CM | POA: Insufficient documentation

## 2020-04-06 NOTE — Assessment & Plan Note (Signed)
Urine culture confirms UTI patient with prescription for Macrobid she will take with cranberry tabs and probiotics. Encouraged to increase hydration and report any symptoms

## 2020-04-06 NOTE — Progress Notes (Signed)
Patient ID: Sheryl [name], female   DOB: Jan 25, 1961, 59 y.o.   MRN: [id number]    Subjective:    Patient ID: Sheryl [name], female    DOB: 03-May-1961, 59 y.o.   MRN: [id number]  Chief Complaint  Patient presents with  . Follow-up  . pt. states exper. poss. uti-like symp. w/ urine frequency, w    HPI Patient is in today for follow up on chronic medical concerns and new onset of urinary frequency and urgency over the past few days. No fevers or chills. No recent hospitalizations. No polydipsia. Otherwise she reports she is doing well. Denies CP/palp/SOB/HA/congestion/fevers/GI c/o. Taking meds as prescribed  Past Medical History:  Diagnosis Date  . Anemia   . Anxiety   . Anxiety and depression    Follows with Dr Gala Murdoch q 6 months Has taken Zoloft in past without good results   . Chicken pox as a child  . Depression   . Elevated BP 02/06/2012  . FH: colon cancer 06/07/2011  . GERD (gastroesophageal reflux disease)   . 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 Hyperlipidemia 05/08/2012  . Hypertension   . Osteopenia 02/27/2017  . Overweight(278.02)   . Preventative health care 06/07/2011  . Sacral pain 05/08/2012  . Tachycardia 11/10/2014  . Vitamin D deficiency 05/09/2014    Past Surgical History:  Procedure Laterality Date  . [place]   X 2  . COLONOSCOPY    . GUM SURGERY  2007  . PARATHYROIDECTOMY Left 05/16/2019   Procedure: LEFT PARATHYROIDECTOMY;  Surgeon: Armandina Gemma, MD;  Location: [facility];  Service: General;  Laterality: Left;  . right hand surgery  2009   infection debrided    Family History  Problem Relation Age of Onset  . Heart attack Mother   . Diabetes Mother        type 2  . Hypertension Mother   . Hyperlipidemia Mother   . Heart disease Mother   . Cancer Father        colon  . Heart attack Father   . Colon cancer Father   . Hypertension Sister   . Diabetes Sister        type 2  . Hyperlipidemia Sister     . Heart disease Sister   . Hypertension Sister   . Arthritis Sister   . Diabetes Maternal Grandmother   . Hypertension Maternal Grandmother   . Hyperlipidemia Maternal Grandmother     Social History   Socioeconomic History  . Marital status: Married    Spouse name: Not on file  . Number of children: Not on file  . Years of education: Not on file  . Highest education level: Not on file  Occupational History  . Not on file  Tobacco Use  . Smoking status: Never Smoker  . Smokeless tobacco: Never Used  Vaping Use  . Vaping Use: Never used  Substance and Sexual Activity  . Alcohol use: Yes    Alcohol/week: 35.0 standard drinks    Types: 21 Glasses of wine, 14 Standard drinks or equivalent per week    Comment: glass of wine nightly  . Drug use: No  . Sexual activity: Yes    Partners: Male    Comment: no dietary restrictions, lives with husband  Other Topics Concern  . Not on file  Social History Narrative  . Not on file   Social Determinants of Health   Financial Resource Strain:   .  Difficulty of Paying Living Expenses: Not on file  Food Insecurity:   . Worried About Charity fundraiser in the Last Year: Not on file  . Ran Out of Food in the Last Year: Not on file  Transportation Needs:   . Lack of Transportation (Medical): Not on file  . Lack of Transportation (Non-Medical): Not on file  Physical Activity:   . Days of Exercise per Week: Not on file  . Minutes of Exercise per Session: Not on file  Stress:   . Feeling of Stress : Not on file  Social Connections:   . Frequency of Communication with Friends and Family: Not on file  . Frequency of Social Gatherings with Friends and Family: Not on file  . Attends Religious Services: Not on file  . Active Member of Clubs or Organizations: Not on file  . Attends Archivist Meetings: Not on file  . Marital Status: Not on file  Intimate Partner Violence:   . Fear of Current or Ex-Partner: Not on file  .  Emotionally Abused: Not on file  . Physically Abused: Not on file  . Sexually Abused: Not on file    Outpatient Medications Prior to Visit  Medication Sig Dispense Refill  . atorvastatin (LIPITOR) 10 MG tablet TAKE 1 TABLET (10 MG TOTAL) BY MOUTH DAILY AT 6 PM. 90 tablet 1  . cholecalciferol (VITAMIN D3) 25 MCG (1000 UT) tablet Take 1,000 Units by mouth daily.    . ergocalciferol (DRISDOL) 50000 units capsule Take 1 capsule (50,000 Units total) by mouth once a week. 4 capsule 4  . metoprolol succinate (TOPROL-XL) 50 MG 24 hr tablet TAKE 1 TABLET BY MOUTH DAILY WITH OR IMMEDIATELY FOLLOWING A MEAL 90 tablet 1  . pantoprazole (PROTONIX) 40 MG tablet Take 1 tablet (40 mg total) by mouth daily as needed (acid reflux). 90 tablet 1  . sertraline (ZOLOFT) 100 MG tablet TAKE 1.5 TABLETS BY MOUTH DAILY 135 tablet 1   No facility-administered medications prior to visit.    Allergies  Allergen Reactions  . Penicillins Rash    Did it involve swelling of the face/tongue/throat, SOB, or low BP? No Did it involve sudden or severe rash/hives, skin peeling, or any reaction on the inside of your mouth or nose? No Did you need to seek medical attention at a hospital or doctor's office? No When did it last happen?childhood allergy If all above answers are "NO", may proceed with cephalosporin use.     Review of Systems  Constitutional: Positive for malaise/fatigue. Negative for fever.  HENT: Negative for congestion.   Eyes: Negative for blurred vision.  Respiratory: Negative for shortness of breath.   Cardiovascular: Negative for chest pain, palpitations and leg swelling.  Gastrointestinal: Negative for abdominal pain, blood in stool and nausea.  Genitourinary: Positive for frequency and urgency. Negative for flank pain and hematuria.  Musculoskeletal: Negative for falls.  Skin: Negative for rash.  Neurological: Negative for dizziness, loss of consciousness and headaches.    Endo/Heme/Allergies: Negative for environmental allergies.  Psychiatric/Behavioral: Negative for depression. The patient is not nervous/anxious.        Objective:    Physical Exam Vitals and nursing note reviewed.  Constitutional:      General: She is not in acute distress.    Appearance: She is well-developed.  HENT:     Head: Normocephalic and atraumatic.     Nose: Nose normal.  Eyes:     General:  Right eye: No discharge.        Left eye: No discharge.  Cardiovascular:     Rate and Rhythm: Normal rate and regular rhythm.     Heart sounds: No murmur heard.   Pulmonary:     Effort: Pulmonary effort is normal.     Breath sounds: Normal breath sounds.  Abdominal:     General: Bowel sounds are normal.     Palpations: Abdomen is soft.     Tenderness: There is no abdominal tenderness.  Musculoskeletal:     Cervical back: Normal range of motion and neck supple.  Skin:    General: Skin is warm and dry.  Neurological:     Mental Status: She is alert and oriented to person, place, and time.     BP 120/72 (BP Location: Right Arm, Patient Position: Sitting, Cuff Size: Large)   Pulse 79   Temp 98.2 F (36.8 C) (Oral)   Resp 13   Ht 5\' 1"  (1.549 m)   Wt 171 lb 12.8 oz (77.9 kg)   LMP 09/08/2011   SpO2 99%   BMI 32.46 kg/m  Wt Readings from Last 3 Encounters:  04/01/20 171 lb 12.8 oz (77.9 kg)  09/11/19 169 lb (76.7 kg)  06/18/19 169 lb 9.6 oz (76.9 kg)    Diabetic Foot Exam - Simple   No data filed     Lab Results  Component Value Date   WBC 5.6 04/01/2020   HGB 16.1 (H) 04/01/2020   HCT 46.9 (H) 04/01/2020   PLT 205 04/01/2020   GLUCOSE 98 04/01/2020   CHOL 234 (H) 04/01/2020   TRIG 99 04/01/2020   HDL 119 04/01/2020   LDLDIRECT 103.8 01/10/2013   LDLCALC 96 04/01/2020   ALT 19 04/01/2020   AST 26 04/01/2020   NA 141 04/01/2020   K 4.7 04/01/2020   CL 106 04/01/2020   CREATININE 0.89 04/01/2020   BUN 20 04/01/2020   CO2 24 04/01/2020   TSH  4.03 04/01/2020   HGBA1C 4.9 04/01/2020    Lab Results  Component Value Date   TSH 4.03 04/01/2020   Lab Results  Component Value Date   WBC 5.6 04/01/2020   HGB 16.1 (H) 04/01/2020   HCT 46.9 (H) 04/01/2020   MCV 91.1 04/01/2020   PLT 205 04/01/2020   Lab Results  Component Value Date   NA 141 04/01/2020   K 4.7 04/01/2020   CO2 24 04/01/2020   GLUCOSE 98 04/01/2020   BUN 20 04/01/2020   CREATININE 0.89 04/01/2020   BILITOT 0.7 04/01/2020   ALKPHOS 71 09/11/2019   AST 26 04/01/2020   ALT 19 04/01/2020   PROT 7.2 04/01/2020   ALBUMIN 4.4 09/11/2019   CALCIUM 9.8 04/01/2020   ANIONGAP 8 05/13/2019   GFR 76.29 09/11/2019   Lab Results  Component Value Date   CHOL 234 (H) 04/01/2020   Lab Results  Component Value Date   HDL 119 04/01/2020   Lab Results  Component Value Date   LDLCALC 96 04/01/2020   Lab Results  Component Value Date   TRIG 99 04/01/2020   Lab Results  Component Value Date   CHOLHDL 2.0 04/01/2020   Lab Results  Component Value Date   HGBA1C 4.9 04/01/2020       Assessment & Plan:   Problem List Items Addressed This Visit    Anemia    Continue to monitor and patient asymptomatic      Relevant Orders   CBC (  Completed)   Hyperlipidemia    Encouraged heart healthy diet, increase exercise, avoid trans fats, consider a krill oil cap daily      Relevant Orders   TSH (Completed)   Lipid panel (Completed)   Vitamin D insufficiency    Supplement and monitor      Relevant Orders   VITAMIN D 25 Hydroxy (Vit-D Deficiency, Fractures) (Completed)   Hyperglycemia    hgba1c acceptable, minimize simple carbs. Increase exercise as tolerated.      Relevant Orders   Comprehensive metabolic panel (Completed)   TSH (Completed)   Hemoglobin A1c (Completed)   Urinary tract infection    Urine culture confirms UTI patient with prescription for Macrobid she will take with cranberry tabs and probiotics. Encouraged to increase hydration and  report any symptoms      Relevant Medications   nitrofurantoin, macrocrystal-monohydrate, (MACROBID) 100 MG capsule    Other Visit Diagnoses    Urinary frequency    -  Primary   Relevant Orders   POCT urinalysis dipstick (Completed)   Urine Culture (Completed)      I am having Mills Koller. Luczynski "Kim" start on nitrofurantoin (macrocrystal-monohydrate). I am also having her maintain her ergocalciferol, cholecalciferol, metoprolol succinate, sertraline, atorvastatin, and pantoprazole.  Meds ordered this encounter  Medications  . nitrofurantoin, macrocrystal-monohydrate, (MACROBID) 100 MG capsule    Sig: Take 1 capsule (100 mg total) by mouth 2 (two) times daily.    Dispense:  10 capsule    Refill:  0     Penni Homans, MD

## 2020-04-06 NOTE — Assessment & Plan Note (Signed)
hgba1c acceptable, minimize simple carbs. Increase exercise as tolerated.  

## 2020-04-06 NOTE — Assessment & Plan Note (Signed)
Supplement and monitor 

## 2020-04-06 NOTE — Progress Notes (Signed)
Patient has been notified of possible UTI symptoms, and as provider has proscribed, pt. Has been instructed to take full dosage of prescription, and hopefully this should help patient to get better.  Pt. Fully understands and will comply accordantly, and will take the full dosage.

## 2020-04-06 NOTE — Assessment & Plan Note (Signed)
Encouraged heart healthy diet, increase exercise, avoid trans fats, consider a krill oil cap daily 

## 2020-04-06 NOTE — Assessment & Plan Note (Addendum)
Continue to monitor and patient asymptomatic

## 2020-05-23 ENCOUNTER — Other Ambulatory Visit: Payer: Self-pay | Admitting: Family Medicine

## 2020-06-30 ENCOUNTER — Other Ambulatory Visit: Payer: Self-pay | Admitting: Family Medicine

## 2020-09-30 ENCOUNTER — Ambulatory Visit: Payer: 59 | Admitting: Family Medicine

## 2020-12-05 ENCOUNTER — Other Ambulatory Visit: Payer: Self-pay | Admitting: Family Medicine

## 2021-02-10 ENCOUNTER — Other Ambulatory Visit: Payer: Self-pay | Admitting: Family Medicine

## 2021-02-15 ENCOUNTER — Other Ambulatory Visit: Payer: Self-pay

## 2021-02-15 ENCOUNTER — Ambulatory Visit: Payer: 59 | Admitting: Family Medicine

## 2021-02-15 ENCOUNTER — Telehealth: Payer: Self-pay | Admitting: *Deleted

## 2021-02-15 ENCOUNTER — Other Ambulatory Visit: Payer: Self-pay | Admitting: Family Medicine

## 2021-02-15 VITALS — BP 128/86 | HR 111 | Temp 98.4°F | Resp 16 | Wt 174.0 lb

## 2021-02-15 DIAGNOSIS — D649 Anemia, unspecified: Secondary | ICD-10-CM | POA: Diagnosis not present

## 2021-02-15 DIAGNOSIS — E213 Hyperparathyroidism, unspecified: Secondary | ICD-10-CM | POA: Diagnosis not present

## 2021-02-15 DIAGNOSIS — E669 Obesity, unspecified: Secondary | ICD-10-CM

## 2021-02-15 DIAGNOSIS — K219 Gastro-esophageal reflux disease without esophagitis: Secondary | ICD-10-CM | POA: Diagnosis not present

## 2021-02-15 DIAGNOSIS — M8580 Other specified disorders of bone density and structure, unspecified site: Secondary | ICD-10-CM

## 2021-02-15 DIAGNOSIS — E782 Mixed hyperlipidemia: Secondary | ICD-10-CM | POA: Diagnosis not present

## 2021-02-15 DIAGNOSIS — R739 Hyperglycemia, unspecified: Secondary | ICD-10-CM

## 2021-02-15 DIAGNOSIS — E559 Vitamin D deficiency, unspecified: Secondary | ICD-10-CM

## 2021-02-15 LAB — COMPREHENSIVE METABOLIC PANEL
ALT: 17 U/L (ref 0–35)
AST: 23 U/L (ref 0–37)
Albumin: 4.4 g/dL (ref 3.5–5.2)
Alkaline Phosphatase: 75 U/L (ref 39–117)
BUN: 18 mg/dL (ref 6–23)
CO2: 26 mEq/L (ref 19–32)
Calcium: 9.4 mg/dL (ref 8.4–10.5)
Chloride: 103 mEq/L (ref 96–112)
Creatinine, Ser: 0.91 mg/dL (ref 0.40–1.20)
GFR: 69.87 mL/min (ref >60.00)
Glucose, Bld: 99 mg/dL (ref 70–99)
Potassium: 4.3 mEq/L (ref 3.5–5.1)
Sodium: 139 mEq/L (ref 135–145)
Total Bilirubin: 0.6 mg/dL (ref 0.2–1.2)
Total Protein: 6.7 g/dL (ref 6.0–8.3)

## 2021-02-15 LAB — CBC
HCT: 44.7 % (ref 36.0–46.0)
Hemoglobin: 15.3 g/dL — ABNORMAL HIGH (ref 12.0–15.0)
MCHC: 34.2 g/dL (ref 30.0–36.0)
MCV: 91.4 fl (ref 78.0–100.0)
Platelets: 167 10*3/uL (ref 150.0–400.0)
RBC: 4.89 Mil/uL (ref 3.87–5.11)
RDW: 13.5 % (ref 11.5–15.5)
WBC: 5.3 10*3/uL (ref 4.0–10.5)

## 2021-02-15 LAB — LIPID PANEL
Cholesterol: 216 mg/dL — ABNORMAL HIGH (ref 0–200)
HDL: 99 mg/dL (ref >39.00)
LDL Cholesterol: 83 mg/dL (ref 0–99)
NonHDL: 116.97
Total CHOL/HDL Ratio: 2
Triglycerides: 171 mg/dL — ABNORMAL HIGH (ref 0.0–149.0)
VLDL: 34.2 mg/dL (ref 0.0–40.0)

## 2021-02-15 LAB — VITAMIN D 25 HYDROXY (VIT D DEFICIENCY, FRACTURES): VITD: 26.03 ng/mL — ABNORMAL LOW (ref 30.00–100.00)

## 2021-02-15 LAB — TSH: TSH: 3.93 u[IU]/mL (ref 0.35–5.50)

## 2021-02-15 MED ORDER — PANTOPRAZOLE SODIUM 40 MG PO TBEC: 40.0000 mg | DELAYED_RELEASE_TABLET | Freq: Every day | ORAL | 1 refills | Status: DC | PRN

## 2021-02-15 MED ORDER — WEGOVY 0.25 MG/0.5ML ~~LOC~~ SOAJ: 0.2500 mg | SUBCUTANEOUS | 0 refills | Status: DC

## 2021-02-15 NOTE — Assessment & Plan Note (Signed)
Encouraged to get adequate exercise, calcium and vitamin d intake 

## 2021-02-15 NOTE — Assessment & Plan Note (Signed)
hgba1c acceptable, minimize simple carbs. Increase exercise as tolerated.  

## 2021-02-15 NOTE — Patient Instructions (Addendum)
Shingrix is the new shingles shot, 2 shots over 2-6 months, confirm coverage with insurance and document, then can return here for shots with nurse appt or at pharmacy  Pepcid/Famotidine 20mg  up to twice daily as needed OTC  Paxlovid or Molnupiravir is the new COVID medication we can give you if you get COVID so make sure you test if you have symptoms because we have to treat by day 5 of symptoms for it to be effective. If you are positive let us know so we can treat. If a home test is negative and your symptoms are persistent get a PCR test. Can check testing locations at [email or web address] If you are positive we will make an appointment with [place] and we will send in Paxlovid if you would like it. Check with your pharmacy before we meet to confirm they have it in stock, if they do not then we can get the prescription at the [place]   [email or web address]  Heartburn Heartburn is a type of pain or discomfort that can happen in the throat or chest. It is often described as a burning pain. It may also cause a bad, acid-like taste in the mouth. Heartburn may feel worse when you lie down or bend over, and it is often worse at night. Heartburn may be caused by stomach contents that move back up into the esophagus (reflux). Follow these instructions at home: Eating and drinking  Avoid certain foods and drinks as told by your health care provider. This may include: Coffee and tea, with or without caffeine. Drinks that contain alcohol. Energy drinks and sports drinks. Carbonated drinks or sodas. Chocolate and cocoa. Peppermint and mint flavorings. Garlic and onions. Horseradish. Spicy and acidic foods, including peppers, chili powder, curry powder, vinegar, hot sauces, and barbecue sauce. Citrus fruit juices and citrus fruits, such as oranges, lemons, and limes. Tomato-based foods, such as red sauce, chili, salsa, and pizza with red sauce. Fried  and fatty foods, such as donuts, french fries, potato chips, and high-fat dressings. High-fat meats, such as hot dogs and fatty cuts of red and white meats, such as rib eye steak, sausage, ham, and bacon. High-fat dairy items, such as whole milk, butter, and cream cheese. Eat small, frequent meals instead of large meals. Avoid drinking large amounts of liquid with your meals. Avoid eating meals during the 2-3 hours before bedtime. Avoid lying down right after you eat. Do not exercise right after you eat.  Lifestyle     If you are overweight, reduce your weight to an amount that is healthy for you. Ask your health care provider for guidance about a safe weight loss goal. Do not use any products that contain nicotine or tobacco. These products include cigarettes, chewing tobacco, and vaping devices, such as e-cigarettes. These can make symptoms worse. If you need help quitting, ask your health care provider. Wear loose-fitting clothing. Do not wear anything tight around your waist that causes pressure on your abdomen. Raise (elevate) the head of your bed about 6 inches (15 cm) when you sleep. You can use a wedge to do this. Try to reduce your stress, such as with yoga or meditation. If you need help reducing stress, ask your health care provider. Medicines Take over-the-counter and prescription medicines only as told by your health care provider. Do not take aspirin or NSAIDs, such as ibuprofen, unless your health care provider told you to do so. Stop medicines only as told by your health care provider. If you  stop taking some medicines too quickly, your symptoms may get worse. General instructions Pay attention to any changes in your symptoms. Keep all follow-up visits. This is important. Contact a health care provider if: You have new symptoms. You have unexplained weight loss. You have difficulty swallowing, or it hurts to swallow. You have wheezing or a persistent cough. Your symptoms  do not improve with treatment. You have frequent heartburn for more than 2 weeks. Get help right away if: You suddenly have pain in your arms, neck, jaw, teeth, or back. You suddenly feel sweaty, dizzy, or light-headed. You have chest pain or shortness of breath. You vomit and your vomit looks like blood or coffee grounds. Your stool is bloody or black. These symptoms may represent a serious problem that is an emergency. Do not wait to see if the symptoms will go away. Get medical help right away. Call your local emergency services (911 in the [place] Do not drive yourself to the hospital. Summary Heartburn is a type of pain or discomfort that can happen in the throat or chest. It is often described as a burning pain. It may also cause a bad, acid-like taste in the mouth. Avoid certain foods and drinks as told by your health care provider. Take over-the-counter and prescription medicines only as told by your health care provider. Do not take aspirin or NSAIDs, such as ibuprofen, unless your health care provider told you to do so. Contact a health care provider if your symptoms do not improve or they get worse. This information is not intended to replace advice given to you by your health care provider. Make sure you discuss any questions you have with your healthcare provider. Document Revised: 01/21/2020 Document Reviewed: 01/21/2020 Elsevier Patient Education  [place].

## 2021-02-15 NOTE — Assessment & Plan Note (Signed)
Encouraged DASH or MIND diet, decrease po intake and increase exercise as tolerated. Needs 7-8 hours of sleep nightly. Avoid trans fats, eat small, frequent meals every 4-5 hours with lean proteins, complex carbs and healthy fats. Minimize simple carbs, high fat foods and processed foods and given a prescription for Wegovy 0.25 mg [place] weekly x 4 weeks and if insurance covers then will proceed to 0.5 mg weekly in second month and 1 mg weekly for next month then 1.7 mg weekly x 1 month then 2.4 x 1 month then 3 mg weekly

## 2021-02-15 NOTE — Assessment & Plan Note (Signed)
Labs reveal deficiency. Start on Vitamin D 50000 IU caps, 1 cap po weekly x 12 weeks. Disp #4 with 4 rf. Also take daily Vitamin D over the counter. If already taking a daily supplement increase by 1000 IU daily and if not start Vitamin D 2000 IU daily.  

## 2021-02-15 NOTE — Assessment & Plan Note (Signed)
Encourage heart healthy diet such as MIND or DASH diet, increase exercise, avoid trans fats, simple carbohydrates and processed foods, consider a krill or fish or flaxseed oil cap daily. She stopped her atorvastatin not because she was having side effects but because she had heard bad things. Will recheck lipids today and consider Atorvastatin just 3 x a week to restart

## 2021-02-15 NOTE — Assessment & Plan Note (Signed)
Avoid offending foods, start probiotics. Do not eat large meals in late evening and consider raising head of bed. Has occasional breakthrough on Pantoprazole can use Pepcid 20 mg prn

## 2021-02-15 NOTE — Progress Notes (Signed)
Patient ID: Sheryl [name], female    DOB: 1961/07/09  Age: 60 y.o. MRN: [id number]    Subjective:  Subjective  HPI Sheryl [name] presents for office visit today for follow up on GERD and osteopenia. She states that she has no recent sicknesses or recent ER visits to report. She endorses taking metoprolol succinate 50 mg, pantoprazole 40 mg OTC, but does not take atorvastatin 10 mg due to her worry of side effects. Denies CP/palp/SOB/HA/congestion/fevers/GI or GU c/o.   Review of Systems  Constitutional:  Negative for chills, fatigue and fever.  HENT:  Negative for congestion, rhinorrhea, sinus pressure, sinus pain and sore throat.   Eyes:  Negative for pain.  Respiratory:  Negative for cough and shortness of breath.   Cardiovascular:  Negative for chest pain, palpitations and leg swelling.  Gastrointestinal:  Negative for abdominal pain, blood in stool, diarrhea, nausea and vomiting.  Genitourinary:  Negative for decreased urine volume, flank pain, frequency, vaginal bleeding and vaginal discharge.  Musculoskeletal:  Negative for back pain.  Neurological:  Negative for headaches.   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   Her family history includes Arthritis  in her sister; Cancer in her father; Colon cancer in her father; Diabetes in her maternal grandmother, mother, and sister; Heart attack in her father and mother; Heart disease in her mother and sister; Hyperlipidemia in her maternal grandmother, mother, and sister; Hypertension in her maternal grandmother, mother, sister, and sister.She reports that she has never smoked. She has never used smokeless tobacco. She reports current alcohol use of about 35.0 standard drinks of alcohol per week. She reports that she does not use drugs.  Current Outpatient Medications on File Prior to Visit  Medication Sig Dispense Refill   atorvastatin (LIPITOR) 10 MG tablet TAKE 1 TABLET (10 MG TOTAL) BY MOUTH DAILY AT 6 PM. 90 tablet 1   cholecalciferol (VITAMIN D3) 25 MCG (1000 UT) tablet Take 1,000 Units by mouth daily.     metoprolol succinate (TOPROL-XL) 50 MG 24 hr tablet Take 1 tablet (50 mg total) by mouth daily. Take with or immediately following a meal 90 tablet 1   sertraline (ZOLOFT) 100 MG tablet Take 1.5 tablets (150 mg total) by mouth daily. 135 tablet 1   [DISCONTINUED] FLUoxetine (PROZAC) 20 MG capsule Take 20 mg by mouth daily.       No current facility-administered medications on file prior to visit.     Objective:  Objective  Physical Exam Constitutional:      General: She is not in acute distress.    Appearance: Normal appearance. She is not ill-appearing or toxic-appearing.  HENT:     Head: Normocephalic and atraumatic.  Right Ear: Tympanic membrane, ear canal and external ear normal.     Left Ear: Tympanic membrane, ear canal and external ear normal.     Nose: No congestion or rhinorrhea.  Eyes:     Extraocular Movements: Extraocular movements intact.     Pupils: Pupils are equal, round, and reactive to light.  Cardiovascular:     Rate and Rhythm: Normal rate and regular rhythm.     Pulses: Normal pulses.     Heart sounds: Normal heart sounds. No murmur heard. Pulmonary:      Effort: Pulmonary effort is normal. No respiratory distress.     Breath sounds: Normal breath sounds. No wheezing, rhonchi or rales.  Abdominal:     General: Bowel sounds are normal.     Palpations: Abdomen is soft. There is no mass.     Tenderness: There is no abdominal tenderness. There is no guarding.     Hernia: No hernia is present.  Musculoskeletal:        General: Normal range of motion.     Cervical back: Normal range of motion and neck supple.  Skin:    General: Skin is warm and dry.  Neurological:     Mental Status: She is alert and oriented to person, place, and time.  Psychiatric:        Behavior: Behavior normal.   BP 128/86   Pulse (!) 111   Temp 98.4 F (36.9 C)   Resp 16   Wt 174 lb (78.9 kg)   LMP 09/08/2011   SpO2 99%   BMI 32.88 kg/m  Wt Readings from Last 3 Encounters:  02/15/21 174 lb (78.9 kg)  04/01/20 171 lb 12.8 oz (77.9 kg)  09/11/19 169 lb (76.7 kg)     Lab Results  Component Value Date   WBC 5.6 04/01/2020   HGB 16.1 (H) 04/01/2020   HCT 46.9 (H) 04/01/2020   PLT 205 04/01/2020   GLUCOSE 98 04/01/2020   CHOL 234 (H) 04/01/2020   TRIG 99 04/01/2020   HDL 119 04/01/2020   LDLDIRECT 103.8 01/10/2013   LDLCALC 96 04/01/2020   ALT 19 04/01/2020   AST 26 04/01/2020   NA 141 04/01/2020   K 4.7 04/01/2020   CL 106 04/01/2020   CREATININE 0.89 04/01/2020   BUN 20 04/01/2020   CO2 24 04/01/2020   TSH 4.03 04/01/2020   HGBA1C 4.9 04/01/2020    No results found.   Assessment & Plan:  Plan    Meds ordered this encounter  Medications   pantoprazole (PROTONIX) 40 MG tablet    Sig: Take 1 tablet (40 mg total) by mouth daily as needed (acid reflux).    Dispense:  90 tablet    Refill:  1   Semaglutide-Weight Management (WEGOVY) 0.25 MG/0.5ML SOAJ    Sig: Inject 0.25 mg into the skin once a week.    Dispense:  2 mL    Refill:  0     Problem List Items Addressed This Visit     GERD (gastroesophageal reflux disease)    Avoid  offending foods, start probiotics. Do not eat large meals in late evening and consider raising head of bed. Has occasional breakthrough on Pantoprazole can use Pepcid 20 mg prn        Relevant Medications   pantoprazole (PROTONIX) 40 MG tablet   Other Relevant Orders   CBC   TSH   Obesity (BMI 30-39.9)    Encouraged DASH or MIND diet, decrease po intake  and increase exercise as tolerated. Needs 7-8 hours of sleep nightly. Avoid trans fats, eat small, frequent meals every 4-5 hours with lean proteins, complex carbs and healthy fats. Minimize simple carbs, high fat foods and processed foods and given a prescription for Wegovy 0.25 mg [place] weekly x 4 weeks and if insurance covers then will proceed to 0.5 mg weekly in second month and 1 mg weekly for next month then 1.7 mg weekly x 1 month then 2.4 x 1 month then 3 mg weekly       Relevant Medications   Semaglutide-Weight Management (WEGOVY) 0.25 MG/0.5ML SOAJ   Anemia   Relevant Orders   CBC   Hyperlipidemia    Encourage heart healthy diet such as MIND or DASH diet, increase exercise, avoid trans fats, simple carbohydrates and processed foods, consider a krill or fish or flaxseed oil cap daily. She stopped her atorvastatin not because she was having side effects but because she had heard bad things. Will recheck lipids today and consider Atorvastatin just 3 x a week to restart       Relevant Orders   Lipid panel   Vitamin D insufficiency - Primary    Labs reveal deficiency. Start on Vitamin D 50000 IU caps, 1 cap po weekly x 12 weeks. Disp #4 with 4 rf. Also take daily Vitamin D over the counter. If already taking a daily supplement increase by 1000 IU daily and if not start Vitamin D 2000 IU daily.        Relevant Orders   Comprehensive metabolic panel   VITAMIN D 25 Hydroxy (Vit-D Deficiency, Fractures)   Osteopenia    Encouraged to get adequate exercise, calcium and vitamin d intake       Hyperparathyroidism (HCC)    Hyperglycemia    hgba1c acceptable, minimize simple carbs. Increase exercise as tolerated.         Follow-up: Return in about 6 months (around 08/18/2021), or 2-3 mn VV f/u and 6-7 mn CPE, for annual exam.  I, Sheryl [name], acting as a scribe for Penni Homans, MD, have documented all relevent documentation on behalf of Penni Homans, MD, as directed by Penni Homans, MD while in the presence of Penni Homans, MD.  I, Sheryl Lukes, MD personally performed the services described in this documentation. All medical record entries made by the scribe were at my direction and in my presence. I have reviewed the chart and agree that the record reflects my personal performance and is accurate and complete

## 2021-02-15 NOTE — Telephone Encounter (Signed)
Prior auth started via cover my meds.  Awaiting determination.  Key: [id number]

## 2021-02-18 NOTE — Telephone Encounter (Signed)
Prior Josem Kaufmann was not needed.  Spoke with Aldona Bar, [facility], and she stated that prior auth was not needed because there was a paid claim.  Called pharmacy back and had them rerun through and paid claim was received.

## 2021-02-24 ENCOUNTER — Other Ambulatory Visit: Payer: Self-pay | Admitting: Family Medicine

## 2021-02-24 NOTE — Telephone Encounter (Signed)
Patient comment: [facility] says that [facility]'t [facility] medicine , what can I do

## 2021-02-25 ENCOUNTER — Other Ambulatory Visit: Payer: Self-pay | Admitting: Family Medicine

## 2021-02-25 MED ORDER — SAXENDA 18 MG/3ML ~~LOC~~ SOPN: PEN_INJECTOR | SUBCUTANEOUS | 0 refills | Status: DC

## 2021-02-25 MED ORDER — SAXENDA 18 MG/3ML ~~LOC~~ SOPN: 3.0000 mg | PEN_INJECTOR | Freq: Every day | SUBCUTANEOUS | 2 refills | Status: DC

## 2021-03-27 ENCOUNTER — Other Ambulatory Visit: Payer: Self-pay | Admitting: Family Medicine

## 2021-03-28 NOTE — Telephone Encounter (Signed)
Next appt is 05/24/21.  Do you want to fill at '3mg'$  daily?

## 2021-05-24 ENCOUNTER — Ambulatory Visit: Payer: 59 | Admitting: Family Medicine

## 2021-05-24 ENCOUNTER — Encounter: Payer: Self-pay | Admitting: Family Medicine

## 2021-05-24 ENCOUNTER — Other Ambulatory Visit: Payer: Self-pay

## 2021-05-24 VITALS — BP 132/86 | HR 93 | Temp 98.2°F | Resp 16 | Wt 170.2 lb

## 2021-05-24 DIAGNOSIS — E559 Vitamin D deficiency, unspecified: Secondary | ICD-10-CM

## 2021-05-24 DIAGNOSIS — R739 Hyperglycemia, unspecified: Secondary | ICD-10-CM | POA: Diagnosis not present

## 2021-05-24 DIAGNOSIS — E782 Mixed hyperlipidemia: Secondary | ICD-10-CM

## 2021-05-24 DIAGNOSIS — D649 Anemia, unspecified: Secondary | ICD-10-CM | POA: Diagnosis not present

## 2021-05-24 DIAGNOSIS — F419 Anxiety disorder, unspecified: Secondary | ICD-10-CM

## 2021-05-24 DIAGNOSIS — F32A Depression, unspecified: Secondary | ICD-10-CM

## 2021-05-24 DIAGNOSIS — R7989 Other specified abnormal findings of blood chemistry: Secondary | ICD-10-CM | POA: Diagnosis not present

## 2021-05-24 DIAGNOSIS — S00521A Blister (nonthermal) of lip, initial encounter: Secondary | ICD-10-CM

## 2021-05-24 DIAGNOSIS — M25531 Pain in right wrist: Secondary | ICD-10-CM

## 2021-05-24 DIAGNOSIS — Z23 Encounter for immunization: Secondary | ICD-10-CM

## 2021-05-24 DIAGNOSIS — M25532 Pain in left wrist: Secondary | ICD-10-CM

## 2021-05-24 MED ORDER — ACYCLOVIR 5 % EX CREA: 1.0000 "application " | TOPICAL_CREAM | CUTANEOUS | 2 refills | Status: DC

## 2021-05-24 NOTE — Progress Notes (Signed)
Patient ID: Sheryl [name], female    DOB: [birth year]  Age: 60 y.o. MRN: [id number]    Subjective:   Chief Complaint  Patient presents with   Follow-up    Tingling and feels like the are on fire inside   Subjective   HPI Sheryl [name] presents for office visit today for follow up on weight management and hyperlipidemia. She reports that she did not tolerate Wegovy and did not continue. However, she did start walking a lot and is doing her best.   She has c/o of bilateral numbness and tingling of wrists with right wrist > left wrist. She also experiences hot flashes only localized to wrists bilaterally and they do not change in color. 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 Denies CP/palp/SOB/HA/congestion/fevers or GU c/o. Taking meds as prescribed.  She reports getting blisters on face, but not inside mouth. She sometimes skips meals which she states might contribute to the blisters. She used abreva, but she did not notice any improvements.   Review of Systems  Constitutional:  Negative for chills, fatigue and fever.  HENT:  Negative for congestion, rhinorrhea, sinus pressure, sinus pain and sore throat.   Eyes:  Negative for pain.  Respiratory:  Negative for cough and shortness of breath.   Cardiovascular:  Negative for chest pain, palpitations and leg swelling.  Gastrointestinal:  Positive for constipation. Negative for abdominal pain, blood in stool, diarrhea, nausea and vomiting.  Genitourinary:  Negative for decreased urine volume, flank pain, frequency, vaginal bleeding and vaginal discharge.  Musculoskeletal:  Negative for back pain.  Neurological:  Positive for numbness. Negative for headaches.   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   Her family history includes Arthritis in her sister; Cancer in her father; Colon cancer in her father; Diabetes in her maternal grandmother, mother, and sister; Heart attack in her father and mother; Heart disease in her mother and sister; Hyperlipidemia in her maternal grandmother, mother, and sister; Hypertension in her maternal grandmother, mother, sister, and sister.She reports that she has never smoked. She has never used smokeless tobacco. She reports current alcohol use of about 35.0 standard drinks per week. She reports that she does not use drugs.  Current Outpatient Medications on File Prior to Visit  Medication Sig Dispense Refill   atorvastatin (LIPITOR) 10 MG tablet TAKE 1 TABLET (10 MG TOTAL) BY MOUTH DAILY AT 6 PM. 90 tablet 1   cholecalciferol (VITAMIN D3) 25 MCG (1000 UT) tablet Take 1,000 Units by mouth daily.     Liraglutide -Weight Management (SAXENDA) 18 MG/3ML SOPN Inject 3 mg into the  skin daily. 15 mL 2   metoprolol succinate (TOPROL-XL) 50 MG 24 hr tablet Take 1 tablet (50 mg total) by mouth daily. Take with or immediately following a meal 90 tablet 1   pantoprazole (PROTONIX) 40 MG tablet Take 1 tablet (40 mg total) by mouth daily as needed (acid reflux). 90 tablet 1   sertraline (ZOLOFT) 100 MG tablet Take 1.5 tablets (150 mg total) by mouth daily. 135 tablet 1   [DISCONTINUED] FLUoxetine (PROZAC)  20 MG capsule Take 20 mg by mouth daily.       No current facility-administered medications on file prior to visit.     Objective:  Objective  Physical Exam Constitutional:      General: She is not in acute distress.    Appearance: Normal appearance. She is not ill-appearing or toxic-appearing.  HENT:     Head: Normocephalic and atraumatic.     Right Ear: Tympanic membrane, ear canal and external ear normal.     Left Ear: Tympanic membrane, ear canal and external ear normal.     Nose: No congestion or rhinorrhea.  Eyes:     Extraocular Movements: Extraocular movements intact.     Pupils: Pupils are equal, round, and reactive to light.  Cardiovascular:     Rate and Rhythm: Normal rate and regular rhythm.     Pulses: Normal pulses.     Heart sounds: Normal heart sounds. No murmur heard. Pulmonary:     Effort: Pulmonary effort is normal. No respiratory distress.     Breath sounds: Normal breath sounds. No wheezing, rhonchi or rales.  Abdominal:     General: Bowel sounds are normal.     Palpations: Abdomen is soft. There is no mass.     Tenderness: There is no abdominal tenderness. There is no guarding.     Hernia: No hernia is present.  Musculoskeletal:        General: Normal range of motion.     Cervical back: Normal range of motion and neck supple.  Skin:    General: Skin is warm and dry.  Neurological:     Mental Status: She is alert and oriented to person, place, and time.  Psychiatric:        Behavior: Behavior normal.   BP 132/86   Pulse 93   Temp 98.2 F (36.8 C)   Resp 16   Wt 170 lb 3.2 oz (77.2 kg)   LMP 09/08/2011   SpO2 98%   BMI 32.16 kg/m  Wt Readings from Last 3 Encounters:  05/24/21 170 lb 3.2 oz (77.2 kg)  02/15/21 174 lb (78.9 kg)  04/01/20 171 lb 12.8 oz (77.9 kg)     Lab Results  Component Value Date   WBC 5.3 02/15/2021   HGB 15.3 (H) 02/15/2021   HCT 44.7 02/15/2021   PLT 167.0 02/15/2021   GLUCOSE 99 02/15/2021   CHOL 216 (H)  02/15/2021   TRIG 171.0 (H) 02/15/2021   HDL 99.00 02/15/2021   LDLDIRECT 103.8 01/10/2013   LDLCALC 83 02/15/2021   ALT 17 02/15/2021   AST 23 02/15/2021   NA 139 02/15/2021   K 4.3 02/15/2021   CL 103 02/15/2021   CREATININE 0.91 02/15/2021   BUN 18 02/15/2021   CO2 26 02/15/2021   TSH 3.93 02/15/2021   HGBA1C 4.9 04/01/2020    No results found.   Assessment & Plan:  Plan    Meds ordered this encounter  Medications   acyclovir cream (ZOVIRAX) 5 %  Sig: Apply 1 application topically every 3 (three) hours.    Dispense:  15 g    Refill:  2    Problem List Items Addressed This Visit     Anxiety and depression    Doing well on current dose of Sertraline      Anemia   Relevant Orders   CBC   TSH   Hyperlipidemia    Tolerating statin, encouraged heart healthy diet, avoid trans fats, minimize simple carbs and saturated fats. Increase exercise as tolerated      Relevant Orders   Lipid panel   Abnormal liver function test   Relevant Orders   Comprehensive metabolic panel   Vitamin D insufficiency    Supplement and monitor      Relevant Orders   VITAMIN D 25 Hydroxy (Vit-D Deficiency, Fractures)   Hyperglycemia    hgba1c acceptable, minimize simple carbs. Increase exercise as tolerated. 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 She is encouraged to ice the CT 2-3 x a day then apply Lidocaine topically and brace the wrists qhs.       Blister of lip    Started on Acyclovir Cream she will let us know if she does not have an adequate response      Other Visit Diagnoses     Need for influenza vaccination    -  Primary   Relevant Orders   Flu Vaccine QUAD 36+ mos IM (Fluarix, Fluzone & Afluria Quad PF (Completed)   Need for Tdap vaccination       Relevant Orders   Tdap vaccine greater  than or equal to 7yo IM (Completed)       Follow-up: No follow-ups on file.  I, Suezanne Jacquet, acting as a scribe for Penni Homans, MD, have documented all relevent documentation on behalf of Penni Homans, MD, as directed by Penni Homans, MD while in the presence of Penni Homans, MD. DO:05/25/21.  I, Mosie Lukes, MD personally performed the services described in this documentation. All medical record entries made by the scribe were at my direction and in my presence. I have reviewed the chart and agree that the record reflects my personal performance and is accurate and complete

## 2021-05-24 NOTE — Patient Instructions (Addendum)
Bivalent covid shot for new varient available downstairs Mon-Fri, 9am-3pm with walk-ins or your preferred pharmacy  Molnupiravir/Paxlovid is the new COVID medication we can give you if you get COVID so make sure you test if you have symptoms because we have to treat by day 5 of symptoms for it to be effective. If you are positive let us know so we can treat. If a home test is negative and your symptoms are persistent get a PCR test. Can check testing locations at [email or web address] If you are positive we will make an appointment with [place] and we will send in molnupiravir/paxlovid if you would like it. Check with your pharmacy before we meet to confirm they have it in stock, if they do not then we can get the prescription at the [facility].    Shingrix is the new shingles shot, 2 shots over 2-6 months, confirm coverage with insurance and document, then can return here for shots with nurse appt or at pharmacy  Start fiber supplement with a multivitamin  Ice both wrists for 10-15 minutes twice daily, preferably after cross stitching and apply lidocaine gel or patch that you cut to fit on wrists. Then use wrist splints at night.  Carpal Tunnel Syndrome Carpal tunnel syndrome is a condition that causes pain, weakness, and numbness in your hand and arm. Numbness is when you cannot feel an area in your body. The carpal tunnel is a narrow area that is on the palm side of your wrist. Repeated wrist motion or certain diseases may cause swelling in the tunnel. This swelling can pinch the main nerve in the wrist. This nerve is called the median nerve. What are the causes? This condition may be caused by: Moving your hand and wrist over and over again while doing a task. Injury to the wrist. Arthritis. A sac of fluid (cyst) or abnormal growth (tumor) in the carpal tunnel. Fluid buildup during pregnancy. Use of tools that vibrate. Sometimes the cause is not known. What increases the risk? The  following factors may make you more likely to have this condition: Having a job that makes you do these things: Move your hand over and over again. Work with tools that vibrate, such as drills or sanders. Being a woman. Having diabetes, obesity, thyroid problems, or kidney failure. What are the signs or symptoms? Symptoms of this condition include: A tingling feeling in your fingers. Tingling or loss of feeling in your hand. Pain in your entire arm. This pain may get worse when you bend your wrist and elbow for a long time. Pain in your wrist that goes up your arm to your shoulder. Pain that goes down into your palm or fingers. Weakness in your hands. You may find it hard to grab and hold items. You may feel worse at night. How is this treated? This condition may be treated with: Lifestyle changes. You will be asked to stop or change the activity that caused your problem. Doing exercises and activities that make bones, muscles, and tendons stronger (physical therapy). Learning how to use your hand again (occupational therapy). Medicines for pain and swelling. You may have injections in your wrist. A wrist splint or brace. Surgery. Follow these instructions at home: If you have a splint or brace: Wear the splint or brace as told by your doctor. Take it off only as told by your doctor. Loosen the splint if your fingers: Tingle. Become numb. Turn cold and blue. Keep the splint or brace clean.  If the splint or brace is not waterproof: Do not let it get wet. Cover it with a watertight covering when you take a bath or a shower. Managing pain, stiffness, and swelling If told, put ice on the painful area: If you have a removable splint or brace, remove it as told by your doctor. Put ice in a plastic bag. Place a towel between your skin and the bag. Leave the ice on for 20 minutes, 2-3 times per day. Do not fall asleep with the cold pack on your skin. Take off the ice if your skin  turns bright red. This is very important. If you cannot feel pain, heat, or cold, you have a greater risk of damage to the area. Move your fingers often to reduce stiffness and swelling. General instructions Take over-the-counter and prescription medicines only as told by your doctor. Rest your wrist from any activity that may cause pain. If needed, talk with your boss at work about changes that can help your wrist heal. Do exercises as told by your doctor, physical therapist, or occupational therapist. Keep all follow-up visits. Contact a doctor if: You have new symptoms. Medicine does not help your pain. Your symptoms get worse. Get help right away if: You have very bad numbness or tingling in your wrist or hand. Summary Carpal tunnel syndrome is a condition that causes pain in your hand and arm. It is often caused by repeated wrist motions. Lifestyle changes and medicines are used to treat this problem. Surgery may help in very bad cases. Follow your doctor's instructions about wearing a splint, resting your wrist, keeping follow-up visits, and calling for help. This information is not intended to replace advice given to you by your health care provider. Make sure you discuss any questions you have with your health care provider. Document Revised: 11/27/2019 Document Reviewed: 11/27/2019 Elsevier Patient Education  [place].

## 2021-05-25 ENCOUNTER — Telehealth: Payer: Self-pay

## 2021-05-25 DIAGNOSIS — S00521A Blister (nonthermal) of lip, initial encounter: Secondary | ICD-10-CM | POA: Insufficient documentation

## 2021-05-25 LAB — LIPID PANEL
Cholesterol: 216 mg/dL — ABNORMAL HIGH (ref 0–200)
HDL: 108.7 mg/dL (ref >39.00)
LDL Cholesterol: 91 mg/dL (ref 0–99)
NonHDL: 107.63
Total CHOL/HDL Ratio: 2
Triglycerides: 85 mg/dL (ref 0.0–149.0)
VLDL: 17 mg/dL (ref 0.0–40.0)

## 2021-05-25 LAB — CBC
HCT: 44.7 % (ref 36.0–46.0)
Hemoglobin: 15 g/dL (ref 12.0–15.0)
MCHC: 33.5 g/dL (ref 30.0–36.0)
MCV: 92.5 fl (ref 78.0–100.0)
Platelets: 165 10*3/uL (ref 150.0–400.0)
RBC: 4.83 Mil/uL (ref 3.87–5.11)
RDW: 13.4 % (ref 11.5–15.5)
WBC: 4.5 10*3/uL (ref 4.0–10.5)

## 2021-05-25 LAB — COMPREHENSIVE METABOLIC PANEL
ALT: 16 U/L (ref 0–35)
AST: 22 U/L (ref 0–37)
Albumin: 4.5 g/dL (ref 3.5–5.2)
Alkaline Phosphatase: 69 U/L (ref 39–117)
BUN: 19 mg/dL (ref 6–23)
CO2: 27 mEq/L (ref 19–32)
Calcium: 9.7 mg/dL (ref 8.4–10.5)
Chloride: 105 mEq/L (ref 96–112)
Creatinine, Ser: 0.92 mg/dL (ref 0.40–1.20)
GFR: 68.83 mL/min (ref >60.00)
Glucose, Bld: 86 mg/dL (ref 70–99)
Potassium: 4.5 mEq/L (ref 3.5–5.1)
Sodium: 140 mEq/L (ref 135–145)
Total Bilirubin: 0.6 mg/dL (ref 0.2–1.2)
Total Protein: 6.9 g/dL (ref 6.0–8.3)

## 2021-05-25 LAB — VITAMIN D 25 HYDROXY (VIT D DEFICIENCY, FRACTURES): VITD: 25.04 ng/mL — ABNORMAL LOW (ref 30.00–100.00)

## 2021-05-25 LAB — TSH: TSH: 3.5 u[IU]/mL (ref 0.35–5.50)

## 2021-05-25 LAB — HEMOGLOBIN A1C: Hgb A1c MFr Bld: 5.1 % (ref 4.6–6.5)

## 2021-05-25 NOTE — Assessment & Plan Note (Signed)
Doing well on current dose of Sertraline

## 2021-05-25 NOTE — Assessment & Plan Note (Signed)
Supplement and monitor 

## 2021-05-25 NOTE — Assessment & Plan Note (Signed)
Tolerating statin, encouraged heart healthy diet, avoid trans fats, minimize simple carbs and saturated fats. Increase exercise as tolerated 

## 2021-05-25 NOTE — Assessment & Plan Note (Addendum)
hgba1c acceptable, minimize simple carbs. Increase exercise as tolerated. Given flu shot and Tdap today

## 2021-05-25 NOTE — Telephone Encounter (Signed)
Prior auth started  Acyclovir 5% cream [id number]   Waiting for outcome

## 2021-05-25 NOTE — Assessment & Plan Note (Signed)
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 She is encouraged to ice the CT 2-3 x a day then apply Lidocaine topically and brace the wrists qhs.

## 2021-05-25 NOTE — Assessment & Plan Note (Signed)
Started on Acyclovir Cream she will let us know if she does not have an adequate response

## 2021-05-27 ENCOUNTER — Other Ambulatory Visit: Payer: Self-pay | Admitting: Family Medicine

## 2021-05-27 MED ORDER — ACYCLOVIR 5 % EX OINT: 1.0000 "application " | TOPICAL_OINTMENT | CUTANEOUS | 1 refills | Status: DC

## 2021-05-27 NOTE — Telephone Encounter (Signed)
These were the only choices, we can certainly see if they will or not.  Can you please send in.

## 2021-05-27 NOTE — Telephone Encounter (Signed)
Acyclovir cream was denied.  Formulary alternatives are: acyclovir capsules/tablets, valacyclovir. (Requirement: 3 in a class with 3 or more alternatives, 2 in a class with 2 alternatives, or 1 in a class with only 1 alternative.)

## 2021-06-23 ENCOUNTER — Other Ambulatory Visit: Payer: Self-pay | Admitting: Family Medicine

## 2021-09-29 ENCOUNTER — Encounter: Payer: 59 | Admitting: Family Medicine

## 2021-10-11 ENCOUNTER — Encounter: Payer: 59 | Admitting: Family Medicine

## 2021-12-24 ENCOUNTER — Other Ambulatory Visit: Payer: Self-pay | Admitting: Family Medicine

## 2022-03-20 ENCOUNTER — Encounter: Payer: Self-pay | Admitting: Family Medicine

## 2022-03-20 ENCOUNTER — Ambulatory Visit (INDEPENDENT_AMBULATORY_CARE_PROVIDER_SITE_OTHER): Payer: 59 | Admitting: Family Medicine

## 2022-03-20 VITALS — BP 151/91 | HR 69 | Temp 98.2°F | Resp 16 | Ht 61.0 in | Wt 167.1 lb

## 2022-03-20 DIAGNOSIS — E559 Vitamin D deficiency, unspecified: Secondary | ICD-10-CM

## 2022-03-20 DIAGNOSIS — Z780 Asymptomatic menopausal state: Secondary | ICD-10-CM | POA: Diagnosis not present

## 2022-03-20 DIAGNOSIS — Z1211 Encounter for screening for malignant neoplasm of colon: Secondary | ICD-10-CM | POA: Diagnosis not present

## 2022-03-20 DIAGNOSIS — K635 Polyp of colon: Secondary | ICD-10-CM

## 2022-03-20 DIAGNOSIS — Z0000 Encounter for general adult medical examination without abnormal findings: Secondary | ICD-10-CM | POA: Diagnosis not present

## 2022-03-20 DIAGNOSIS — F419 Anxiety disorder, unspecified: Secondary | ICD-10-CM | POA: Diagnosis not present

## 2022-03-20 DIAGNOSIS — R739 Hyperglycemia, unspecified: Secondary | ICD-10-CM | POA: Diagnosis not present

## 2022-03-20 DIAGNOSIS — E782 Mixed hyperlipidemia: Secondary | ICD-10-CM | POA: Diagnosis not present

## 2022-03-20 DIAGNOSIS — Z1231 Encounter for screening mammogram for malignant neoplasm of breast: Secondary | ICD-10-CM

## 2022-03-20 DIAGNOSIS — E2839 Other primary ovarian failure: Secondary | ICD-10-CM | POA: Diagnosis not present

## 2022-03-20 DIAGNOSIS — D649 Anemia, unspecified: Secondary | ICD-10-CM

## 2022-03-20 DIAGNOSIS — E669 Obesity, unspecified: Secondary | ICD-10-CM | POA: Diagnosis not present

## 2022-03-20 DIAGNOSIS — M8580 Other specified disorders of bone density and structure, unspecified site: Secondary | ICD-10-CM

## 2022-03-20 DIAGNOSIS — F32A Depression, unspecified: Secondary | ICD-10-CM

## 2022-03-20 LAB — HEMOGLOBIN A1C: Hgb A1c MFr Bld: 5.3 % (ref 4.6–6.5)

## 2022-03-20 LAB — LIPID PANEL
Cholesterol: 214 mg/dL — ABNORMAL HIGH (ref 0–200)
HDL: 89 mg/dL
LDL Cholesterol: 96 mg/dL (ref 0–99)
NonHDL: 124.76
Total CHOL/HDL Ratio: 2
Triglycerides: 143 mg/dL (ref 0.0–149.0)
VLDL: 28.6 mg/dL (ref 0.0–40.0)

## 2022-03-20 LAB — CBC
HCT: 44.5 % (ref 36.0–46.0)
Hemoglobin: 15.1 g/dL — ABNORMAL HIGH (ref 12.0–15.0)
MCHC: 33.8 g/dL (ref 30.0–36.0)
MCV: 90.6 fl (ref 78.0–100.0)
Platelets: 181 K/uL (ref 150.0–400.0)
RBC: 4.91 Mil/uL (ref 3.87–5.11)
RDW: 13.9 % (ref 11.5–15.5)
WBC: 4.9 K/uL (ref 4.0–10.5)

## 2022-03-20 LAB — COMPREHENSIVE METABOLIC PANEL
ALT: 12 U/L (ref 0–35)
AST: 18 U/L (ref 0–37)
Albumin: 4.4 g/dL (ref 3.5–5.2)
Alkaline Phosphatase: 68 U/L (ref 39–117)
BUN: 19 mg/dL (ref 6–23)
CO2: 27 mEq/L (ref 19–32)
Calcium: 9.5 mg/dL (ref 8.4–10.5)
Chloride: 102 mEq/L (ref 96–112)
Creatinine, Ser: 0.85 mg/dL (ref 0.40–1.20)
GFR: 75.25 mL/min (ref >60.00)
Glucose, Bld: 92 mg/dL (ref 70–99)
Potassium: 4.2 mEq/L (ref 3.5–5.1)
Sodium: 139 mEq/L (ref 135–145)
Total Bilirubin: 0.7 mg/dL (ref 0.2–1.2)
Total Protein: 6.7 g/dL (ref 6.0–8.3)

## 2022-03-20 LAB — TSH: TSH: 3.04 u[IU]/mL (ref 0.35–5.50)

## 2022-03-20 LAB — VITAMIN D 25 HYDROXY (VIT D DEFICIENCY, FRACTURES): VITD: 24.8 ng/mL — ABNORMAL LOW (ref 30.00–100.00)

## 2022-03-20 MED ORDER — WEGOVY 0.25 MG/0.5ML ~~LOC~~ SOAJ: 0.2500 mg | SUBCUTANEOUS | 0 refills | Status: DC

## 2022-03-20 NOTE — Assessment & Plan Note (Signed)
hgba1c acceptable, minimize simple carbs. Increase exercise as tolerated.  

## 2022-03-20 NOTE — Assessment & Plan Note (Signed)
Patient encouraged to maintain heart healthy diet, regular exercise, adequate sleep. Consider daily probiotics. Take medications as prescribed. Labs ordered and reviewed. MGM  2020 needs one will order. Pap 2020 repeat in next 2 years  . Colonoscopy  2012 needs repeat colonoscopy. Dexa 2018 due this year will order. Shingrix is the new shingles shot, 2 shots over 2-6 months, confirm coverage with insurance and document, then can return here for shots with nurse appt or at pharmacy

## 2022-03-20 NOTE — Assessment & Plan Note (Signed)

## 2022-03-20 NOTE — Assessment & Plan Note (Signed)
Resolved on last check will continue to monitor

## 2022-03-20 NOTE — Assessment & Plan Note (Signed)
Tolerating statin, encouraged heart healthy diet, avoid trans fats, minimize simple carbs and saturated fats. Increase exercise as tolerated 

## 2022-03-20 NOTE — Patient Instructions (Signed)
Shingrix is the new shingles shot, 2 shots over 2-6 months, confirm coverage with insurance and document, then can return here for shots with nurse appt or at pharmacy  Preventive Care 40-59 Years Old, Female Preventive care refers to lifestyle choices and visits with your health care provider that can promote health and wellness. Preventive care visits are also called wellness exams. What can I expect for my preventive care visit? Counseling Your health care provider may ask you questions about your: Medical history, including: Past medical problems. Family medical history. Pregnancy history. Current health, including: Menstrual cycle. Method of birth control. Emotional well-being. Home life and relationship well-being. Sexual activity and sexual health. Lifestyle, including: Alcohol, nicotine or tobacco, and drug use. Access to firearms. Diet, exercise, and sleep habits. Work and work environment. Sunscreen use. Safety issues such as seatbelt and bike helmet use. Physical exam Your health care provider will check your: Height and weight. These may be used to calculate your BMI (body mass index). BMI is a measurement that tells if you are at a healthy weight. Waist circumference. This measures the distance around your waistline. This measurement also tells if you are at a healthy weight and may help predict your risk of certain diseases, such as type 2 diabetes and high blood pressure. Heart rate and blood pressure. Body temperature. Skin for abnormal spots. What immunizations do I need?  Vaccines are usually given at various ages, according to a schedule. Your health care provider will recommend vaccines for you based on your age, medical history, and lifestyle or other factors, such as travel or where you work. What tests do I need? Screening Your health care provider may recommend screening tests for certain conditions. This may include: Lipid and cholesterol levels. Diabetes  screening. This is done by checking your blood sugar (glucose) after you have not eaten for a while (fasting). Pelvic exam and Pap test. Hepatitis B test. Hepatitis C test. HIV (human immunodeficiency virus) test. STI (sexually transmitted infection) testing, if you are at risk. Lung cancer screening. Colorectal cancer screening. Mammogram. Talk with your health care provider about when you should start having regular mammograms. This may depend on whether you have a family history of breast cancer. BRCA-related cancer screening. This may be done if you have a family history of breast, ovarian, tubal, or peritoneal cancers. Bone density scan. This is done to screen for osteoporosis. Talk with your health care provider about your test results, treatment options, and if necessary, the need for more tests. Follow these instructions at home: Eating and drinking  Eat a diet that includes fresh fruits and vegetables, whole grains, lean protein, and low-fat dairy products. Take vitamin and mineral supplements as recommended by your health care provider. Do not drink alcohol if: Your health care provider tells you not to drink. You are pregnant, may be pregnant, or are planning to become pregnant. If you drink alcohol: Limit how much you have to 0-1 drink a day. Know how much alcohol is in your drink. In the [place] one drink equals one 12 oz bottle of beer (355 mL), one 5 oz glass of wine (148 mL), or one 1 oz glass of hard liquor (44 mL). Lifestyle Brush your teeth every morning and night with fluoride toothpaste. Floss one time each day. Exercise for at least 30 minutes 5 or more days each week. Do not use any products that contain nicotine or tobacco. These products include cigarettes, chewing tobacco, and vaping devices, such as e-cigarettes. If   you need help quitting, ask your health care provider. Do not use drugs. If you are sexually active, practice safe sex. Use a condom or other form of  protection to prevent STIs. If you do not wish to become pregnant, use a form of birth control. If you plan to become pregnant, see your health care provider for a prepregnancy visit. Take aspirin only as told by your health care provider. Make sure that you understand how much to take and what form to take. Work with your health care provider to find out whether it is safe and beneficial for you to take aspirin daily. Find healthy ways to manage stress, such as: Meditation, yoga, or listening to music. Journaling. Talking to a trusted person. Spending time with friends and family. Minimize exposure to UV radiation to reduce your risk of skin cancer. Safety Always wear your seat belt while driving or riding in a vehicle. Do not drive: If you have been drinking alcohol. Do not ride with someone who has been drinking. When you are tired or distracted. While texting. If you have been using any mind-altering substances or drugs. Wear a helmet and other protective equipment during sports activities. If you have firearms in your house, make sure you follow all gun safety procedures. Seek help if you have been physically or sexually abused. What's next? Visit your health care provider once a year for an annual wellness visit. Ask your health care provider how often you should have your eyes and teeth checked. Stay up to date on all vaccines. This information is not intended to replace advice given to you by your health care provider. Make sure you discuss any questions you have with your health care provider. Document Revised: 01/12/2021 Document Reviewed: 01/12/2021 Elsevier Patient Education  2023 [employer] Inc[employer]  

## 2022-03-20 NOTE — Progress Notes (Signed)
Subjective:    Patient ID: Sheryl [name], female    DOB: [birth year], 61 y.o.   MRN: [id number]  Chief Complaint  Patient presents with   Annual Exam    HPI Patient is in today for annual preventative exam and follow up on chronic medical concerns. No recent febrile illness or acute hospitalizations. She is generally feeling well. She never started [place] due to needing to take daily doses. She is trying to eat right but is having trouble loosing weigh. No complaints of polyuria or polydipsia. Denies CP/palp/SOB/HA/congestion/fevers/GI or GU c/o. Taking meds as prescribed.   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   Hyperlipidemia 05/08/2012   Hypertension    Osteopenia 02/27/2017   Overweight(278.02)    Preventative health care 06/07/2011   Sacral pain 05/08/2012   Tachycardia 11/10/2014   Vitamin D deficiency 05/09/2014    Past Surgical History:  Procedure Laterality Date   [place] and 1989   X 2   COLONOSCOPY     GUM SURGERY  2007   PARATHYROIDECTOMY Left 05/16/2019   Procedure: LEFT PARATHYROIDECTOMY;  Surgeon: Armandina Gemma, MD;  Location: [facility];  Service: General;  Laterality: Left;   right hand surgery  2009   infection debrided    Family History  Problem Relation Age of Onset   Heart attack Mother    Diabetes Mother        type 2   Hypertension Mother    Hyperlipidemia Mother    Heart disease Mother    Cancer Father        colon   Heart attack Father    Colon cancer Father    Hypertension Sister    Diabetes Sister        type 2   Hyperlipidemia Sister    Heart disease Sister    Hypertension Sister    Arthritis Sister     Diabetes Maternal Grandmother    Hypertension Maternal Grandmother    Hyperlipidemia Maternal Grandmother     Social History   Socioeconomic History   Marital status: Married    Spouse name: Not on file   Number of children: Not on file   Years of education: Not on file   Highest education level: Not on file  Occupational History   Not on file  Tobacco Use   Smoking status: Never   Smokeless tobacco: Never  Vaping Use   Vaping Use: Never used  Substance and Sexual Activity   Alcohol use: Yes    Alcohol/week: 35.0 standard drinks of alcohol    Types: 21 Glasses of wine, 14 Standard drinks or equivalent per week    Comment: glass of wine nightly   Drug use: No   Sexual activity: Yes    Partners: Male    Comment: no dietary restrictions, lives with husband  Other Topics Concern   Not on file  Social History Narrative   Not on file   Social Determinants of Health   Financial Resource Strain: Not on file  Food Insecurity: Not on file  Transportation Needs: Not on  file  Physical Activity: Not on file  Stress: Not on file  Social Connections: Not on file  Intimate Partner Violence: Not on file    Outpatient Medications Prior to Visit  Medication Sig Dispense Refill   acyclovir ointment (ZOVIRAX) 5 % Apply 1 application topically every 3 (three) hours. 30 g 1   cholecalciferol (VITAMIN D3) 25 MCG (1000 UT) tablet Take 1,000 Units by mouth daily.     metoprolol succinate (TOPROL-XL) 50 MG 24 hr tablet TAKE 1 TABLET BY MOUTH EVERY DAY WITH OR IMMEDIATELY FOLLOWING A MEAL 90 tablet 1   pantoprazole (PROTONIX) 40 MG tablet Take 1 tablet (40 mg total) by mouth daily as needed (acid reflux). 90 tablet 1   sertraline (ZOLOFT) 100 MG tablet TAKE 1.5 TABLETS ('150MG'$  TOTAL) BY MOUTH DAILY 135 tablet 1   atorvastatin (LIPITOR) 10 MG tablet TAKE 1 TABLET (10 MG TOTAL) BY MOUTH DAILY AT 6 PM. (Patient not taking: Reported on 03/20/2022) 90 tablet 1   Liraglutide -Weight Management  (SAXENDA) 18 MG/3ML SOPN Inject 3 mg into the skin daily. (Patient not taking: Reported on 03/20/2022) 15 mL 2   No facility-administered medications prior to visit.    Allergies  Allergen Reactions   Penicillins Rash    Did it involve swelling of the face/tongue/throat, SOB, or low BP? No Did it involve sudden or severe rash/hives, skin peeling, or any reaction on the inside of your mouth or nose? No Did you need to seek medical attention at a hospital or doctor's office? No When did it last happen?      childhood allergy If all above answers are "NO", may proceed with cephalosporin use.     Review of Systems  Constitutional:  Positive for malaise/fatigue. Negative for chills and fever.  HENT:  Negative for congestion and hearing loss.   Eyes:  Negative for discharge.  Respiratory:  Negative for cough, sputum production and shortness of breath.   Cardiovascular:  Negative for chest pain, palpitations and leg swelling.  Gastrointestinal:  Negative for abdominal pain, blood in stool, constipation, diarrhea, heartburn, nausea and vomiting.  Genitourinary:  Negative for dysuria, frequency, hematuria and urgency.  Musculoskeletal:  Negative for back pain, falls and myalgias.  Skin:  Negative for rash.  Neurological:  Negative for dizziness, sensory change, loss of consciousness, weakness and headaches.  Endo/Heme/Allergies:  Negative for environmental allergies. Does not bruise/bleed easily.  Psychiatric/Behavioral:  Negative for depression and suicidal ideas. The patient is not nervous/anxious and does not have insomnia.        Objective:    Physical Exam Constitutional:      General: She is not in acute distress.    Appearance: She is well-developed.  HENT:     Head: Normocephalic and atraumatic.  Eyes:     Conjunctiva/sclera: Conjunctivae normal.  Neck:     Thyroid: No thyromegaly.  Cardiovascular:     Rate and Rhythm: Normal rate and regular rhythm.     Heart sounds:  Normal heart sounds. No murmur heard. Pulmonary:     Effort: Pulmonary effort is normal. No respiratory distress.     Breath sounds: Normal breath sounds.  Abdominal:     General: Bowel sounds are normal. There is no distension.     Palpations: Abdomen is soft. There is no mass.     Tenderness: There is no abdominal tenderness.  Musculoskeletal:     Cervical back: Neck supple.  Lymphadenopathy:     Cervical: No cervical adenopathy.  Skin:  General: Skin is warm and dry.  Neurological:     Mental Status: She is alert and oriented to person, place, and time.  Psychiatric:        Behavior: Behavior normal.     BP (!) 151/91   Pulse 69   Temp 98.2 F (36.8 C) (Oral)   Resp 16   Ht '5\' 1"'$  (1.549 m)   Wt 167 lb 2 oz (75.8 kg)   LMP 09/08/2011   SpO2 99%   BMI 31.58 kg/m  Wt Readings from Last 3 Encounters:  03/20/22 167 lb 2 oz (75.8 kg)  05/24/21 170 lb 3.2 oz (77.2 kg)  02/15/21 174 lb (78.9 kg)    Diabetic Foot Exam - Simple   No data filed    Lab Results  Component Value Date   WBC 4.9 03/20/2022   HGB 15.1 (H) 03/20/2022   HCT 44.5 03/20/2022   PLT 181.0 03/20/2022   GLUCOSE 92 03/20/2022   CHOL 214 (H) 03/20/2022   TRIG 143.0 03/20/2022   HDL 89.00 03/20/2022   LDLDIRECT 103.8 01/10/2013   LDLCALC 96 03/20/2022   ALT 12 03/20/2022   AST 18 03/20/2022   NA 139 03/20/2022   K 4.2 03/20/2022   CL 102 03/20/2022   CREATININE 0.85 03/20/2022   BUN 19 03/20/2022   CO2 27 03/20/2022   TSH 3.04 03/20/2022   HGBA1C 5.3 03/20/2022    Lab Results  Component Value Date   TSH 3.04 03/20/2022   Lab Results  Component Value Date   WBC 4.9 03/20/2022   HGB 15.1 (H) 03/20/2022   HCT 44.5 03/20/2022   MCV 90.6 03/20/2022   PLT 181.0 03/20/2022   Lab Results  Component Value Date   NA 139 03/20/2022   K 4.2 03/20/2022   CO2 27 03/20/2022   GLUCOSE 92 03/20/2022   BUN 19 03/20/2022   CREATININE 0.85 03/20/2022   BILITOT 0.7 03/20/2022   ALKPHOS 68  03/20/2022   AST 18 03/20/2022   ALT 12 03/20/2022   PROT 6.7 03/20/2022   ALBUMIN 4.4 03/20/2022   CALCIUM 9.5 03/20/2022   ANIONGAP 8 05/13/2019   GFR 75.25 03/20/2022   Lab Results  Component Value Date   CHOL 214 (H) 03/20/2022   Lab Results  Component Value Date   HDL 89.00 03/20/2022   Lab Results  Component Value Date   LDLCALC 96 03/20/2022   Lab Results  Component Value Date   TRIG 143.0 03/20/2022   Lab Results  Component Value Date   CHOLHDL 2 03/20/2022   Lab Results  Component Value Date   HGBA1C 5.3 03/20/2022       Assessment & Plan:   Problem List Items Addressed This Visit     Anxiety and depression    On Zoloft 150 mg daily, doing well       Obesity (BMI 30-39.9)    Encouraged DASH or MIND diet, decrease po intake and increase exercise as tolerated. Needs 7-8 hours of sleep nightly. Avoid trans fats, eat small, frequent meals every 4-5 hours with lean proteins, complex carbs and healthy fats. Minimize simple carbs, high fat foods and processed foods. Did not want Saxenda due to daily. Will try Wegovy 0.25 and increase monthly as tolerated      Relevant Medications   Semaglutide-Weight Management (WEGOVY) 0.25 MG/0.5ML SOAJ   Annual physical exam    Patient encouraged to maintain heart healthy diet, regular exercise, adequate sleep. Consider daily probiotics. Take medications as  prescribed. Labs ordered and reviewed. MGM  2020 needs one will order. Pap 2020 repeat in next 2 years  . Colonoscopy  2012 needs repeat colonoscopy. Dexa 2018 due this year will order. Shingrix is the new shingles shot, 2 shots over 2-6 months, confirm coverage with insurance and document, then can return here for shots with nurse appt or at pharmacy      Relevant Orders   TSH (Completed)   Anemia    Resolved on last check will continue to monitor      Relevant Orders   CBC (Completed)   Hyperlipidemia    Tolerating statin, encouraged heart healthy diet, avoid  trans fats, minimize simple carbs and saturated fats. Increase exercise as tolerated      Relevant Orders   CBC (Completed)   Comprehensive metabolic panel (Completed)   Lipid panel (Completed)   Vitamin D insufficiency    Supplement and monitor      Relevant Orders   VITAMIN D 25 Hydroxy (Vit-D Deficiency, Fractures) (Completed)   Colon polyp    And fh of colon cancer. She needs repeat colonoscopy      Osteopenia    Bone density shows osteopenia, which is thinner than normal but not as bad as osteoporosis. Recommend calcium intake of 1200 to 1500 mg daily, divided into roughly 3 doses. Best source is the diet and a single dairy serving is about 500 mg, a supplement of calcium citrate once or twice daily to balance diet is fine if not getting enough in diet. Also need Vitamin D 2000 IU caps, 1 cap daily if not already taking vitamin D. Also recommend weight baring exercise on hips and upper body to keep bones strong      Hyperglycemia    hgba1c acceptable, minimize simple carbs. Increase exercise as tolerated.      Relevant Orders   Hemoglobin A1c (Completed)   TSH (Completed)   Other Visit Diagnoses     Colon cancer screening    -  Primary   Relevant Orders   Ambulatory referral to Gastroenterology   Post-menopausal       Relevant Orders   DG Bone Density   Estrogen deficiency       Relevant Orders   DG Bone Density   Encounter for screening mammogram for malignant neoplasm of breast       Relevant Orders   MM 3D SCREEN BREAST BILATERAL       I have discontinued Hardie Shackleton "Kim"'s Saxenda. I am also having her start on Wegovy. Additionally, I am having her maintain her cholecalciferol, atorvastatin, pantoprazole, acyclovir ointment, sertraline, and metoprolol succinate.  Meds ordered this encounter  Medications   Semaglutide-Weight Management (WEGOVY) 0.25 MG/0.5ML SOAJ    Sig: Inject 0.25 mg into the skin once a week.    Dispense:  2 mL    Refill:  0      Penni Homans, MD

## 2022-03-20 NOTE — Assessment & Plan Note (Addendum)
On Zoloft 150 mg daily, doing well

## 2022-03-20 NOTE — Assessment & Plan Note (Addendum)
Encouraged DASH or MIND diet, decrease po intake and increase exercise as tolerated. Needs 7-8 hours of sleep nightly. Avoid trans fats, eat small, frequent meals every 4-5 hours with lean proteins, complex carbs and healthy fats. Minimize simple carbs, high fat foods and processed foods. Did not want Saxenda due to daily. Will try Wegovy 0.25 and increase monthly as tolerated

## 2022-03-20 NOTE — Assessment & Plan Note (Signed)
Supplement and monitor 

## 2022-03-20 NOTE — Assessment & Plan Note (Signed)
And fh of colon cancer. She needs repeat colonoscopy

## 2022-03-21 ENCOUNTER — Other Ambulatory Visit: Payer: Self-pay

## 2022-03-21 DIAGNOSIS — E559 Vitamin D deficiency, unspecified: Secondary | ICD-10-CM

## 2022-03-21 MED ORDER — VITAMIN D (ERGOCALCIFEROL) 1.25 MG (50000 UNIT) PO CAPS: 50000.0000 [IU] | ORAL_CAPSULE | ORAL | 4 refills | Status: DC

## 2022-04-04 ENCOUNTER — Other Ambulatory Visit (HOSPITAL_BASED_OUTPATIENT_CLINIC_OR_DEPARTMENT_OTHER): Payer: 59

## 2022-04-04 ENCOUNTER — Inpatient Hospital Stay (HOSPITAL_BASED_OUTPATIENT_CLINIC_OR_DEPARTMENT_OTHER): Admission: RE | Admit: 2022-04-04 | Payer: 59 | Source: Ambulatory Visit

## 2022-05-03 ENCOUNTER — Other Ambulatory Visit: Payer: Self-pay | Admitting: Family Medicine

## 2022-06-29 ENCOUNTER — Ambulatory Visit: Payer: 59 | Admitting: Family Medicine

## 2022-07-28 ENCOUNTER — Other Ambulatory Visit: Payer: Self-pay | Admitting: Family Medicine

## 2022-10-31 ENCOUNTER — Other Ambulatory Visit: Payer: Self-pay | Admitting: Family Medicine

## 2022-11-15 ENCOUNTER — Encounter: Payer: Self-pay | Admitting: Family Medicine

## 2022-11-20 ENCOUNTER — Encounter: Payer: Self-pay | Admitting: Family Medicine

## 2022-11-20 ENCOUNTER — Ambulatory Visit: Payer: 59 | Admitting: Family Medicine

## 2022-11-20 VITALS — BP 139/87 | HR 105 | Ht 61.0 in | Wt 170.0 lb

## 2022-11-20 DIAGNOSIS — I10 Essential (primary) hypertension: Secondary | ICD-10-CM | POA: Insufficient documentation

## 2022-11-20 MED ORDER — HYDROCHLOROTHIAZIDE 12.5 MG PO TABS
12.5000 mg | ORAL_TABLET | Freq: Every day | ORAL | 3 refills | Status: DC
Start: 2022-11-20 — End: 2022-12-14

## 2022-11-20 NOTE — Progress Notes (Signed)
Acute Office Visit  Subjective:     Patient ID: Sheryl [name], female    DOB: [birth year], 62 y.o.   MRN: [id number]  Chief Complaint  Patient presents with   Medical Management of Chronic Issues    HPI Patient is in today for elevated BP.Marland Kitchen States she was recently at her dentist (last week) and they noted high blood pressure (SBP 155), thought she was nervous. States she started monitoring at home - see below. She has been a Radiation protection practitioner thinking about it. Patient denies any chest pain, palpitations, dyspnea, wheezing, edema, recurrent headaches, vision changes. States she has been on metoprolol succinate for awhile now, but is unsure why - per chart review she was started on this around 2016/2017 for tachycardia. She does have a family history of HTN.   4/20: 119/82 HR 78 4/21: 136/96 HR 68 4/21: 150/108 HR 101 4/22: 140/113 HR 118   ROS All review of systems negative except what is listed in the HPI      Objective:    BP 139/87   Pulse (!) 105   Ht  (1.549 m)   Wt 170 lb (77.1 kg)   LMP 09/08/2011   SpO2 100%   BMI 32.12 kg/m    Physical Exam Vitals reviewed.  Constitutional:      Appearance: Normal appearance.  Cardiovascular:     Rate and Rhythm: Normal rate and regular rhythm.     Pulses: Normal pulses.     Heart sounds: Normal heart sounds.  Pulmonary:     Effort: Pulmonary effort is normal.     Breath sounds: Normal breath sounds.  Musculoskeletal:     Cervical back: Normal range of motion and neck supple. No tenderness.     Right lower leg: No edema.     Left lower leg: No edema.  Lymphadenopathy:     Cervical: No cervical adenopathy.  Skin:    General: Skin is warm and dry.  Neurological:     Mental Status: She is alert and oriented to person, place, and time.  Psychiatric:        Mood and Affect: Mood normal.        Behavior: Behavior normal.        Thought Content: Thought content normal.        Judgment: Judgment normal.      No results found for any visits on 11/20/22.      Assessment & Plan:   Problem List Items Addressed This Visit     Primary hypertension - Primary    Blood pressure is borderline goal for age and co-morbidities.   Recommendations: continue metoprolol 50 mg daily and add hydrochlorothiazide 12.5 mg daily  - BP goal <130/80 - monitor and log blood pressures at home - check around the same time each day in a relaxed setting - Limit salt to <2000 mg/day - Follow DASH eating plan (heart healthy diet) - limit alcohol to 2 standard drinks per day for men and 1 per day for women - avoid tobacco products - get at least 2 hours of regular aerobic exercise weekly Patient aware of signs/symptoms requiring further/urgent evaluation. Labs updated today. Nurse visit in 2 weeks to recheck      Relevant Medications   hydrochlorothiazide (HYDRODIURIL) 12.5 MG tablet   Other Relevant Orders   CBC with Differential/Platelet   Comprehensive metabolic panel   TSH    Meds ordered this encounter  Medications   hydrochlorothiazide (HYDRODIURIL) 12.5 MG  tablet    Sig: Take 1 tablet (12.5 mg total) by mouth daily.    Dispense:  30 tablet    Refill:  3    Order Specific Question:   Supervising Provider    Answer:   Danise Edge A [[id number]]    Return in about 2 weeks (around 12/04/2022) for BP check with nurse.  Clayborne Dana, NP

## 2022-11-20 NOTE — Assessment & Plan Note (Signed)
Blood pressure is borderline goal for age and co-morbidities.   Recommendations: continue metoprolol 50 mg daily and add hydrochlorothiazide 12.5 mg daily  - BP goal <130/80 - monitor and log blood pressures at home - check around the same time each day in a relaxed setting - Limit salt to <2000 mg/day - Follow DASH eating plan (heart healthy diet) - limit alcohol to 2 standard drinks per day for men and 1 per day for women - avoid tobacco products - get at least 2 hours of regular aerobic exercise weekly Patient aware of signs/symptoms requiring further/urgent evaluation. Labs updated today. Nurse visit in 2 weeks to recheck 

## 2022-11-20 NOTE — Patient Instructions (Signed)
Blood pressure is borderline goal for age and co-morbidities.   Recommendations: continue metoprolol 50 mg daily and add hydrochlorothiazide 12.5 mg daily  - BP goal <130/80 - monitor and log blood pressures at home - check around the same time each day in a relaxed setting - Limit salt to <2000 mg/day - Follow DASH eating plan (heart healthy diet) - limit alcohol to 2 standard drinks per day for men and 1 per day for women - avoid tobacco products - get at least 2 hours of regular aerobic exercise weekly Patient aware of signs/symptoms requiring further/urgent evaluation. Labs updated today. Nurse visit in 2 weeks to recheck

## 2022-11-21 LAB — COMPREHENSIVE METABOLIC PANEL
ALT: 13 U/L (ref 0–35)
AST: 21 U/L (ref 0–37)
Albumin: 4.5 g/dL (ref 3.5–5.2)
Alkaline Phosphatase: 55 U/L (ref 39–117)
BUN: 22 mg/dL (ref 6–23)
CO2: 26 mEq/L (ref 19–32)
Calcium: 9.6 mg/dL (ref 8.4–10.5)
Chloride: 104 mEq/L (ref 96–112)
Creatinine, Ser: 1.04 mg/dL (ref 0.40–1.20)
GFR: 58.79 mL/min — ABNORMAL LOW (ref >60.00)
Glucose, Bld: 98 mg/dL (ref 70–99)
Potassium: 4.3 mEq/L (ref 3.5–5.1)
Sodium: 137 mEq/L (ref 135–145)
Total Bilirubin: 0.7 mg/dL (ref 0.2–1.2)
Total Protein: 7.1 g/dL (ref 6.0–8.3)

## 2022-11-21 LAB — CBC WITH DIFFERENTIAL/PLATELET
Basophils Absolute: 0.1 10*3/uL (ref 0.0–0.1)
Basophils Relative: 0.9 % (ref 0.0–3.0)
Eosinophils Absolute: 0.1 10*3/uL (ref 0.0–0.7)
Eosinophils Relative: 2 % (ref 0.0–5.0)
HCT: 43.5 % (ref 36.0–46.0)
Hemoglobin: 14.8 g/dL (ref 12.0–15.0)
Lymphocytes Relative: 27.5 % (ref 12.0–46.0)
Lymphs Abs: 1.5 10*3/uL (ref 0.7–4.0)
MCHC: 34.1 g/dL (ref 30.0–36.0)
MCV: 91.5 fl (ref 78.0–100.0)
Monocytes Absolute: 0.4 10*3/uL (ref 0.1–1.0)
Monocytes Relative: 7.8 % (ref 3.0–12.0)
Neutro Abs: 3.3 10*3/uL (ref 1.4–7.7)
Neutrophils Relative %: 61.8 % (ref 43.0–77.0)
Platelets: 184 10*3/uL (ref 150.0–400.0)
RBC: 4.75 Mil/uL (ref 3.87–5.11)
RDW: 13.5 % (ref 11.5–15.5)
WBC: 5.4 10*3/uL (ref 4.0–10.5)

## 2022-11-21 LAB — TSH: TSH: 4.21 u[IU]/mL (ref 0.35–5.50)

## 2022-12-06 ENCOUNTER — Ambulatory Visit: Payer: 59

## 2022-12-14 ENCOUNTER — Other Ambulatory Visit (HOSPITAL_BASED_OUTPATIENT_CLINIC_OR_DEPARTMENT_OTHER): Payer: Self-pay | Admitting: Family Medicine

## 2022-12-14 ENCOUNTER — Ambulatory Visit (INDEPENDENT_AMBULATORY_CARE_PROVIDER_SITE_OTHER): Payer: 59 | Admitting: Family Medicine

## 2022-12-14 DIAGNOSIS — Z1231 Encounter for screening mammogram for malignant neoplasm of breast: Secondary | ICD-10-CM

## 2022-12-14 DIAGNOSIS — I10 Essential (primary) hypertension: Secondary | ICD-10-CM

## 2022-12-14 MED ORDER — METOPROLOL SUCCINATE ER 25 MG PO TB24: 25.0000 mg | ORAL_TABLET | Freq: Every day | ORAL | 1 refills | Status: DC

## 2022-12-14 NOTE — Progress Notes (Signed)
Pt here for Blood pressure check per Dr. Abner Greenspan  Pt currently takes: hctz 12.5mg , metoprolol succinate 50mg   Has not been taking the hctz 12.5mg  it made her feel weird and had weird dreams.  She is planning to go back on it.  She just did not want to feel bad while she was out of town.  136, 132 has been the top numbers when checking at home.  Pt reports compliance with medication.  BP Readings from Last 3 Encounters:  11/20/22 139/87  03/20/22 (!) 151/91  05/24/21 132/86    BP today @ = 136/86 HR =103   Pt advised per Ladona Ridgel that blood pressure looks ok today but pulse rate is high.  Pt can stop hctz and add metoprolol succinate 25mg  to equal 75mg .  Ok to just follow up in July with Dr. Abner Greenspan.

## 2022-12-19 ENCOUNTER — Ambulatory Visit (HOSPITAL_BASED_OUTPATIENT_CLINIC_OR_DEPARTMENT_OTHER)
Admission: RE | Admit: 2022-12-19 | Discharge: 2022-12-19 | Disposition: A | Discharge status: Home / Self Care | Payer: 59 | Source: Ambulatory Visit | Attending: Family Medicine | Admitting: Family Medicine

## 2022-12-19 DIAGNOSIS — E2839 Other primary ovarian failure: Secondary | ICD-10-CM | POA: Insufficient documentation

## 2022-12-19 DIAGNOSIS — Z1231 Encounter for screening mammogram for malignant neoplasm of breast: Secondary | ICD-10-CM | POA: Diagnosis present

## 2022-12-19 DIAGNOSIS — Z780 Asymptomatic menopausal state: Secondary | ICD-10-CM | POA: Diagnosis present

## 2022-12-26 ENCOUNTER — Ambulatory Visit (HOSPITAL_BASED_OUTPATIENT_CLINIC_OR_DEPARTMENT_OTHER)
Admission: RE | Admit: 2022-12-26 | Discharge: 2022-12-26 | Disposition: A | Discharge status: Home / Self Care | Payer: 59 | Source: Ambulatory Visit | Attending: Family Medicine | Admitting: Family Medicine

## 2022-12-26 ENCOUNTER — Encounter (HOSPITAL_BASED_OUTPATIENT_CLINIC_OR_DEPARTMENT_OTHER): Payer: Self-pay

## 2022-12-26 DIAGNOSIS — Z1231 Encounter for screening mammogram for malignant neoplasm of breast: Secondary | ICD-10-CM | POA: Insufficient documentation

## 2023-01-05 ENCOUNTER — Other Ambulatory Visit: Payer: Self-pay | Admitting: Family Medicine

## 2023-01-29 NOTE — Assessment & Plan Note (Signed)
Tolerating statin, encouraged heart healthy diet, avoid trans fats, minimize simple carbs and saturated fats. Increase exercise as tolerated 

## 2023-01-29 NOTE — Assessment & Plan Note (Signed)
hgba1c acceptable, minimize simple carbs. Increase exercise as tolerated.  

## 2023-01-29 NOTE — Progress Notes (Unsigned)
Subjective:    Patient ID: Sheryl [name], female    DOB: [birth year], 62 y.o.   MRN: [id number]  No chief complaint on file.   HPI Discussed the use of AI scribe software for clinical note transcription with the patient, who gave verbal consent to proceed.  History of Present Illness         Patient is a 62 yo female in today for follow up on chronic medical concerns. No recent febrile illness or hospitalizations. Denies CP/palp/SOB/HA/congestion/fevers/GI or GU c/o. Taking meds as prescribed    Past Medical History:  Diagnosis Date  . Anemia   . Anxiety   . Anxiety and depression    Follows with Dr Valinda Hoar q 6 months Has taken Zoloft in past without good results   . Chicken pox as a child  . Depression   . Elevated BP 02/06/2012  . FH: colon cancer 06/07/2011  . GERD (gastroesophageal reflux disease)   . 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 Hyperlipidemia 05/08/2012  . Hypertension   . Osteopenia 02/27/2017  . Overweight(278.02)   . Preventative health care 06/07/2011  . Sacral pain 05/08/2012  . Tachycardia 11/10/2014  . Vitamin D deficiency 05/09/2014    Past Surgical History:  Procedure Laterality Date  . CESAREAN SECTION  1985 and 1989   X 2  . COLONOSCOPY    . GUM SURGERY  2007  . PARATHYROIDECTOMY Left 05/16/2019   Procedure: LEFT PARATHYROIDECTOMY;  Surgeon: Darnell Level, MD;  Location: [facility];  Service: General;  Laterality: Left;  . right hand surgery  2009   infection debrided    Family History  Problem Relation Age of Onset  . Heart attack Mother   . Diabetes Mother        type 2  . Hypertension Mother   . Hyperlipidemia Mother   . Heart disease Mother   . Cancer Father        colon  . Heart attack Father   . Colon cancer Father   . Hypertension Sister   . Diabetes Sister        type 2  . Hyperlipidemia Sister   . Heart disease Sister   . Hypertension Sister   . Arthritis Sister   . Diabetes Maternal Grandmother   .  Hypertension Maternal Grandmother   . Hyperlipidemia Maternal Grandmother     Social History   Socioeconomic History  . Marital status: Married    Spouse name: Not on file  . Number of children: Not on file  . Years of education: Not on file  . Highest education level: Not on file  Occupational History  . Not on file  Tobacco Use  . Smoking status: Never  . Smokeless tobacco: Never  Vaping Use  . Vaping Use: Never used  Substance and Sexual Activity  . Alcohol use: Yes    Alcohol/week: 35.0 standard drinks of alcohol    Types: 21 Glasses of wine, 14 Standard drinks or equivalent per week    Comment: glass of wine nightly  . Drug use: No  . Sexual activity: Yes    Partners: Male    Comment: no dietary restrictions, lives with husband  Other Topics Concern  . Not on file  Social History Narrative  . Not on file   Social Determinants of Health   Financial Resource Strain: Not on file  Food Insecurity: Not on file  Transportation Needs: Not on file  Physical Activity: Not on file  Stress: Not on file  Social Connections: Not on file  Intimate Partner Violence: Not on file    Outpatient Medications Prior to Visit  Medication Sig Dispense Refill  . atorvastatin (LIPITOR) 10 MG tablet TAKE 1 TABLET (10 MG TOTAL) BY MOUTH DAILY AT 6 PM. 90 tablet 1  . metoprolol succinate (TOPROL-XL) 25 MG 24 hr tablet TAKE 1 TABLET (25 MG TOTAL) BY MOUTH DAILY. TAKE WITH 50MG  TO EQUAL 75MG  90 tablet 1  . metoprolol succinate (TOPROL-XL) 50 MG 24 hr tablet TAKE 1 TABLET BY MOUTH EVERY DAY WITH OR IMMEDIATELY FOLLOWING A MEAL 90 tablet 1  . pantoprazole (PROTONIX) 40 MG tablet TAKE 1 TABLET (40 MG TOTAL) BY MOUTH DAILY AS NEEDED FOR ACID REFLUX 90 tablet 1  . sertraline (ZOLOFT) 100 MG tablet TAKE 1.5 TABLETS (150 MG TOTAL) BY MOUTH DAILY 135 tablet 1  . Vitamin D, Ergocalciferol, (DRISDOL) 1.25 MG (50000 UNIT) CAPS capsule Take 1 capsule (50,000 Units total) by mouth every 7 (seven) days. 4  capsule 4   No facility-administered medications prior to visit.    Allergies  Allergen Reactions  . Hydrochlorothiazide Other (See Comments)    Had weird dreams and made her feel bad.  . Penicillins Rash    Did it involve swelling of the face/tongue/throat, SOB, or low BP? No Did it involve sudden or severe rash/hives, skin peeling, or any reaction on the inside of your mouth or nose? No Did you need to seek medical attention at a hospital or doctor's office? No When did it last happen?      childhood allergy If all above answers are "NO", may proceed with cephalosporin use.     Review of Systems  Constitutional:  Negative for fever and malaise/fatigue.  HENT:  Negative for congestion.   Eyes:  Negative for blurred vision.  Respiratory:  Negative for shortness of breath.   Cardiovascular:  Negative for chest pain, palpitations and leg swelling.  Gastrointestinal:  Negative for abdominal pain, blood in stool and nausea.  Genitourinary:  Negative for dysuria and frequency.  Musculoskeletal:  Negative for falls.  Skin:  Negative for rash.  Neurological:  Negative for dizziness, loss of consciousness and headaches.  Endo/Heme/Allergies:  Negative for environmental allergies.  Psychiatric/Behavioral:  Negative for depression. The patient is not nervous/anxious.       Objective:    Physical Exam Constitutional:      General: She is not in acute distress.    Appearance: Normal appearance. She is well-developed. She is not toxic-appearing.  HENT:     Head: Normocephalic and atraumatic.     Right Ear: External ear normal.     Left Ear: External ear normal.     Nose: Nose normal.  Eyes:     General:        Right eye: No discharge.        Left eye: No discharge.     Conjunctiva/sclera: Conjunctivae normal.  Neck:     Thyroid: No thyromegaly.  Cardiovascular:     Rate and Rhythm: Normal rate and regular rhythm.     Heart sounds: Normal heart sounds. No murmur  heard. Pulmonary:     Effort: Pulmonary effort is normal. No respiratory distress.     Breath sounds: Normal breath sounds.  Abdominal:     General: Bowel sounds are normal.     Palpations: Abdomen is soft.     Tenderness: There is no abdominal tenderness. There is no guarding.  Musculoskeletal:  General: Normal range of motion.     Cervical back: Neck supple.  Lymphadenopathy:     Cervical: No cervical adenopathy.  Skin:    General: Skin is warm and dry.  Neurological:     Mental Status: She is alert and oriented to person, place, and time.  Psychiatric:        Mood and Affect: Mood normal.        Behavior: Behavior normal.        Thought Content: Thought content normal.        Judgment: Judgment normal.   LMP 09/08/2011  Wt Readings from Last 3 Encounters:  11/20/22 170 lb (77.1 kg)  03/20/22 167 lb 2 oz (75.8 kg)  05/24/21 170 lb 3.2 oz (77.2 kg)    Diabetic Foot Exam - Simple   No data filed    Lab Results  Component Value Date   WBC 5.4 11/20/2022   HGB 14.8 11/20/2022   HCT 43.5 11/20/2022   PLT 184.0 11/20/2022   GLUCOSE 98 11/20/2022   CHOL 214 (H) 03/20/2022   TRIG 143.0 03/20/2022   HDL 89.00 03/20/2022   LDLDIRECT 103.8 01/10/2013   LDLCALC 96 03/20/2022   ALT 13 11/20/2022   AST 21 11/20/2022   NA 137 11/20/2022   K 4.3 11/20/2022   CL 104 11/20/2022   CREATININE 1.04 11/20/2022   BUN 22 11/20/2022   CO2 26 11/20/2022   TSH 4.21 11/20/2022   HGBA1C 5.3 03/20/2022    Lab Results  Component Value Date   TSH 4.21 11/20/2022   Lab Results  Component Value Date   WBC 5.4 11/20/2022   HGB 14.8 11/20/2022   HCT 43.5 11/20/2022   MCV 91.5 11/20/2022   PLT 184.0 11/20/2022   Lab Results  Component Value Date   NA 137 11/20/2022   K 4.3 11/20/2022   CO2 26 11/20/2022   GLUCOSE 98 11/20/2022   BUN 22 11/20/2022   CREATININE 1.04 11/20/2022   BILITOT 0.7 11/20/2022   ALKPHOS 55 11/20/2022   AST 21 11/20/2022   ALT 13 11/20/2022    PROT 7.1 11/20/2022   ALBUMIN 4.5 11/20/2022   CALCIUM 9.6 11/20/2022   ANIONGAP 8 05/13/2019   GFR 58.79 (L) 11/20/2022   Lab Results  Component Value Date   CHOL 214 (H) 03/20/2022   Lab Results  Component Value Date   HDL 89.00 03/20/2022   Lab Results  Component Value Date   LDLCALC 96 03/20/2022   Lab Results  Component Value Date   TRIG 143.0 03/20/2022   Lab Results  Component Value Date   CHOLHDL 2 03/20/2022   Lab Results  Component Value Date   HGBA1C 5.3 03/20/2022       Assessment & Plan:  There are no diagnoses linked to this encounter.  Assessment and Plan              Danise Edge, MD

## 2023-01-29 NOTE — Assessment & Plan Note (Signed)
Well controlled, no changes to meds. Encouraged heart healthy diet such as the DASH diet and exercise as tolerated.  °

## 2023-01-29 NOTE — Assessment & Plan Note (Signed)
Encouraged to get adequate exercise, calcium and vitamin d intake 

## 2023-01-29 NOTE — Assessment & Plan Note (Signed)
Supplement and monitor 

## 2023-01-30 ENCOUNTER — Encounter: Payer: Self-pay | Admitting: Family Medicine

## 2023-01-30 ENCOUNTER — Ambulatory Visit: Payer: 59 | Admitting: Family Medicine

## 2023-01-30 VITALS — BP 128/80 | HR 92 | Temp 98.2°F | Resp 12 | Ht 61.0 in | Wt 169.2 lb

## 2023-01-30 DIAGNOSIS — I10 Essential (primary) hypertension: Secondary | ICD-10-CM | POA: Diagnosis not present

## 2023-01-30 DIAGNOSIS — E559 Vitamin D deficiency, unspecified: Secondary | ICD-10-CM | POA: Diagnosis not present

## 2023-01-30 DIAGNOSIS — E782 Mixed hyperlipidemia: Secondary | ICD-10-CM | POA: Diagnosis not present

## 2023-01-30 DIAGNOSIS — R739 Hyperglycemia, unspecified: Secondary | ICD-10-CM

## 2023-01-30 DIAGNOSIS — M8580 Other specified disorders of bone density and structure, unspecified site: Secondary | ICD-10-CM

## 2023-01-30 NOTE — Patient Instructions (Addendum)
Blood Pressure 100/60 to 140/90 Pulse 60-90   Hypertension, Adult High blood pressure (hypertension) is when the force of blood pumping through the arteries is too strong. The arteries are the blood vessels that carry blood from the heart throughout the body. Hypertension forces the heart to work harder to pump blood and may cause arteries to become narrow or stiff. Untreated or uncontrolled hypertension can lead to a heart attack, heart failure, a stroke, kidney disease, and other problems. A blood pressure reading consists of a higher number over a lower number. Ideally, your blood pressure should be below 120/80. The first ("top") number is called the systolic pressure. It is a measure of the pressure in your arteries as your heart beats. The second ("bottom") number is called the diastolic pressure. It is a measure of the pressure in your arteries as the heart relaxes. What are the causes? The exact cause of this condition is not known. There are some conditions that result in high blood pressure. What increases the risk? Certain factors may make you more likely to develop high blood pressure. Some of these risk factors are under your control, including: Smoking. Not getting enough exercise or physical activity. Being overweight. Having too much fat, sugar, calories, or salt (sodium) in your diet. Drinking too much alcohol. Other risk factors include: Having a personal history of heart disease, diabetes, high cholesterol, or kidney disease. Stress. Having a family history of high blood pressure and high cholesterol. Having obstructive sleep apnea. Age. The risk increases with age. What are the signs or symptoms? High blood pressure may not cause symptoms. Very high blood pressure (hypertensive crisis) may cause: Headache. Fast or irregular heartbeats (palpitations). Shortness of breath. Nosebleed. Nausea and vomiting. Vision changes. Severe chest pain, dizziness, and seizures. How  is this diagnosed? This condition is diagnosed by measuring your blood pressure while you are seated, with your arm resting on a flat surface, your legs uncrossed, and your feet flat on the floor. The cuff of the blood pressure monitor will be placed directly against the skin of your upper arm at the level of your heart. Blood pressure should be measured at least twice using the same arm. Certain conditions can cause a difference in blood pressure between your right and left arms. If you have a high blood pressure reading during one visit or you have normal blood pressure with other risk factors, you may be asked to: Return on a different day to have your blood pressure checked again. Monitor your blood pressure at home for 1 week or longer. If you are diagnosed with hypertension, you may have other blood or imaging tests to help your health care provider understand your overall risk for other conditions. How is this treated? This condition is treated by making healthy lifestyle changes, such as eating healthy foods, exercising more, and reducing your alcohol intake. You may be referred for counseling on a healthy diet and physical activity. Your health care provider may prescribe medicine if lifestyle changes are not enough to get your blood pressure under control and if: Your systolic blood pressure is above 130. Your diastolic blood pressure is above 80. Your personal target blood pressure may vary depending on your medical conditions, your age, and other factors. Follow these instructions at home: Eating and drinking  Eat a diet that is high in fiber and potassium, and low in sodium, added sugar, and fat. An example of this eating plan is called the DASH diet. DASH stands for Dietary  Approaches to Stop Hypertension. To eat this way: Eat plenty of fresh fruits and vegetables. Try to fill one half of your plate at each meal with fruits and vegetables. Eat whole grains, such as whole-wheat pasta,  brown rice, or whole-grain bread. Fill about one fourth of your plate with whole grains. Eat or drink low-fat dairy products, such as skim milk or low-fat yogurt. Avoid fatty cuts of meat, processed or cured meats, and poultry with skin. Fill about one fourth of your plate with lean proteins, such as fish, chicken without skin, beans, eggs, or tofu. Avoid pre-made and processed foods. These tend to be higher in sodium, added sugar, and fat. Reduce your daily sodium intake. Many people with hypertension should eat less than 1,500 mg of sodium a day. Do not drink alcohol if: Your health care provider tells you not to drink. You are pregnant, may be pregnant, or are planning to become pregnant. If you drink alcohol: Limit how much you have to: 0-1 drink a day for women. 0-2 drinks a day for men. Know how much alcohol is in your drink. In the [place] one drink equals one 12 oz bottle of beer (355 mL), one 5 oz glass of wine (148 mL), or one 1 oz glass of hard liquor (44 mL). Lifestyle  Work with your health care provider to maintain a healthy body weight or to lose weight. Ask what an ideal weight is for you. Get at least 30 minutes of exercise that causes your heart to beat faster (aerobic exercise) most days of the week. Activities may include walking, swimming, or biking. Include exercise to strengthen your muscles (resistance exercise), such as Pilates or lifting weights, as part of your weekly exercise routine. Try to do these types of exercises for 30 minutes at least 3 days a week. Do not use any products that contain nicotine or tobacco. These products include cigarettes, chewing tobacco, and vaping devices, such as e-cigarettes. If you need help quitting, ask your health care provider. Monitor your blood pressure at home as told by your health care provider. Keep all follow-up visits. This is important. Medicines Take over-the-counter and prescription medicines only as told by your health  care provider. Follow directions carefully. Blood pressure medicines must be taken as prescribed. Do not skip doses of blood pressure medicine. Doing this puts you at risk for problems and can make the medicine less effective. Ask your health care provider about side effects or reactions to medicines that you should watch for. Contact a health care provider if you: Think you are having a reaction to a medicine you are taking. Have headaches that keep coming back (recurring). Feel dizzy. Have swelling in your ankles. Have trouble with your vision. Get help right away if you: Develop a severe headache or confusion. Have unusual weakness or numbness. Feel faint. Have severe pain in your chest or abdomen. Vomit repeatedly. Have trouble breathing. These symptoms may be an emergency. Get help right away. Call 911. Do not wait to see if the symptoms will go away. Do not drive yourself to the hospital. Summary Hypertension is when the force of blood pumping through your arteries is too strong. If this condition is not controlled, it may put you at risk for serious complications. Your personal target blood pressure may vary depending on your medical conditions, your age, and other factors. For most people, a normal blood pressure is less than 120/80. Hypertension is treated with lifestyle changes, medicines, or a combination of  both. Lifestyle changes include losing weight, eating a healthy, low-sodium diet, exercising more, and limiting alcohol. This information is not intended to replace advice given to you by your health care provider. Make sure you discuss any questions you have with your health care provider. Document Revised: 05/24/2021 Document Reviewed: 05/24/2021 Elsevier Patient Education  2024 [employer] 60-90

## 2023-01-31 LAB — CBC WITH DIFFERENTIAL/PLATELET
Basophils Absolute: 0 10*3/uL (ref 0.0–0.1)
Basophils Relative: 0.7 % (ref 0.0–3.0)
Eosinophils Absolute: 0.1 10*3/uL (ref 0.0–0.7)
Eosinophils Relative: 1.9 % (ref 0.0–5.0)
HCT: 44.8 % (ref 36.0–46.0)
Hemoglobin: 14.9 g/dL (ref 12.0–15.0)
Lymphocytes Relative: 25.8 % (ref 12.0–46.0)
Lymphs Abs: 1.5 10*3/uL (ref 0.7–4.0)
MCHC: 33.4 g/dL (ref 30.0–36.0)
MCV: 93 fl (ref 78.0–100.0)
Monocytes Absolute: 0.4 10*3/uL (ref 0.1–1.0)
Monocytes Relative: 6.6 % (ref 3.0–12.0)
Neutro Abs: 3.8 10*3/uL (ref 1.4–7.7)
Neutrophils Relative %: 65 % (ref 43.0–77.0)
Platelets: 187 10*3/uL (ref 150.0–400.0)
RBC: 4.81 Mil/uL (ref 3.87–5.11)
RDW: 14.1 % (ref 11.5–15.5)
WBC: 5.8 10*3/uL (ref 4.0–10.5)

## 2023-01-31 LAB — COMPREHENSIVE METABOLIC PANEL
ALT: 13 U/L (ref 0–35)
AST: 20 U/L (ref 0–37)
Albumin: 4.5 g/dL (ref 3.5–5.2)
Alkaline Phosphatase: 63 U/L (ref 39–117)
BUN: 21 mg/dL (ref 6–23)
CO2: 28 mEq/L (ref 19–32)
Calcium: 9.8 mg/dL (ref 8.4–10.5)
Chloride: 104 mEq/L (ref 96–112)
Creatinine, Ser: 0.96 mg/dL (ref 0.40–1.20)
GFR: 64.63 mL/min (ref >60.00)
Glucose, Bld: 87 mg/dL (ref 70–99)
Potassium: 3.9 mEq/L (ref 3.5–5.1)
Sodium: 141 mEq/L (ref 135–145)
Total Bilirubin: 0.8 mg/dL (ref 0.2–1.2)
Total Protein: 7.2 g/dL (ref 6.0–8.3)

## 2023-01-31 LAB — LIPID PANEL
Cholesterol: 240 mg/dL — ABNORMAL HIGH (ref 0–200)
HDL: 104.3 mg/dL (ref >39.00)
LDL Cholesterol: 112 mg/dL — ABNORMAL HIGH (ref 0–99)
NonHDL: 135.46
Total CHOL/HDL Ratio: 2
Triglycerides: 115 mg/dL (ref 0.0–149.0)
VLDL: 23 mg/dL (ref 0.0–40.0)

## 2023-01-31 LAB — VITAMIN D 25 HYDROXY (VIT D DEFICIENCY, FRACTURES): VITD: 23.26 ng/mL — ABNORMAL LOW (ref 30.00–100.00)

## 2023-01-31 LAB — HEMOGLOBIN A1C: Hgb A1c MFr Bld: 5.1 % (ref 4.6–6.5)

## 2023-01-31 LAB — TSH: TSH: 4.14 u[IU]/mL (ref 0.35–5.50)

## 2023-02-15 ENCOUNTER — Other Ambulatory Visit: Payer: Self-pay | Admitting: Family Medicine

## 2023-02-15 DIAGNOSIS — I10 Essential (primary) hypertension: Secondary | ICD-10-CM

## 2023-04-16 ENCOUNTER — Other Ambulatory Visit: Payer: Self-pay | Admitting: Family Medicine

## 2023-04-16 DIAGNOSIS — I10 Essential (primary) hypertension: Secondary | ICD-10-CM

## 2023-08-19 NOTE — Assessment & Plan Note (Signed)
Supplement and monitor 

## 2023-08-19 NOTE — Assessment & Plan Note (Signed)
Hydrate and monitor 

## 2023-08-19 NOTE — Assessment & Plan Note (Signed)
hgba1c acceptable, minimize simple carbs. Increase exercise as tolerated.  

## 2023-08-19 NOTE — Assessment & Plan Note (Addendum)
Patient encouraged to maintain heart healthy diet, regular exercise, adequate sleep. Consider daily probiotics. Take medications as prescribed. Labs ordered and reviewed. MGM  2020 needs one will order. Pap 2020 repeat in next 2 years  . Colonoscopy  2012 needs repeat colonoscopy. Dexa May 2024. Shingrix is the new shingles shot, 2 shots over 2-6 months, confirm coverage with insurance and document, then can return here for shots with nurse appt or at pharmacy

## 2023-08-19 NOTE — Assessment & Plan Note (Signed)
Tolerating statin, encouraged heart healthy diet, avoid trans fats, minimize simple carbs and saturated fats. Increase exercise as tolerated 

## 2023-08-19 NOTE — Assessment & Plan Note (Signed)
On Zoloft 150 mg daily, doing well

## 2023-08-22 ENCOUNTER — Other Ambulatory Visit: Payer: Self-pay | Admitting: Emergency Medicine

## 2023-08-22 DIAGNOSIS — R739 Hyperglycemia, unspecified: Secondary | ICD-10-CM

## 2023-08-22 DIAGNOSIS — E782 Mixed hyperlipidemia: Secondary | ICD-10-CM

## 2023-08-22 DIAGNOSIS — E559 Vitamin D deficiency, unspecified: Secondary | ICD-10-CM

## 2023-08-22 DIAGNOSIS — I10 Essential (primary) hypertension: Secondary | ICD-10-CM

## 2023-08-23 ENCOUNTER — Encounter: Payer: Self-pay | Admitting: Family Medicine

## 2023-08-23 ENCOUNTER — Ambulatory Visit: Payer: 59 | Admitting: Family Medicine

## 2023-08-23 VITALS — BP 142/88 | HR 110 | Temp 98.1°F | Resp 20 | Ht 61.0 in | Wt 168.8 lb

## 2023-08-23 DIAGNOSIS — E782 Mixed hyperlipidemia: Secondary | ICD-10-CM | POA: Diagnosis not present

## 2023-08-23 DIAGNOSIS — Z23 Encounter for immunization: Secondary | ICD-10-CM

## 2023-08-23 DIAGNOSIS — R739 Hyperglycemia, unspecified: Secondary | ICD-10-CM | POA: Diagnosis not present

## 2023-08-23 DIAGNOSIS — Z1211 Encounter for screening for malignant neoplasm of colon: Secondary | ICD-10-CM

## 2023-08-23 DIAGNOSIS — F32A Depression, unspecified: Secondary | ICD-10-CM

## 2023-08-23 DIAGNOSIS — F1010 Alcohol abuse, uncomplicated: Secondary | ICD-10-CM | POA: Diagnosis not present

## 2023-08-23 DIAGNOSIS — L989 Disorder of the skin and subcutaneous tissue, unspecified: Secondary | ICD-10-CM

## 2023-08-23 DIAGNOSIS — E559 Vitamin D deficiency, unspecified: Secondary | ICD-10-CM

## 2023-08-23 DIAGNOSIS — E213 Hyperparathyroidism, unspecified: Secondary | ICD-10-CM

## 2023-08-23 DIAGNOSIS — Z0000 Encounter for general adult medical examination without abnormal findings: Secondary | ICD-10-CM

## 2023-08-23 DIAGNOSIS — L578 Other skin changes due to chronic exposure to nonionizing radiation: Secondary | ICD-10-CM

## 2023-08-23 DIAGNOSIS — F419 Anxiety disorder, unspecified: Secondary | ICD-10-CM | POA: Diagnosis not present

## 2023-08-23 LAB — COMPREHENSIVE METABOLIC PANEL
ALT: 13 U/L (ref 0–35)
AST: 20 U/L (ref 0–37)
Albumin: 4.7 g/dL (ref 3.5–5.2)
Alkaline Phosphatase: 69 U/L (ref 39–117)
BUN: 17 mg/dL (ref 6–23)
CO2: 30 meq/L (ref 19–32)
Calcium: 9.7 mg/dL (ref 8.4–10.5)
Chloride: 103 meq/L (ref 96–112)
Creatinine, Ser: 0.79 mg/dL (ref 0.40–1.20)
GFR: 81.34 mL/min (ref >60.00)
Glucose, Bld: 107 mg/dL — ABNORMAL HIGH (ref 70–99)
Potassium: 5.1 meq/L (ref 3.5–5.1)
Sodium: 142 meq/L (ref 135–145)
Total Bilirubin: 0.8 mg/dL (ref 0.2–1.2)
Total Protein: 7.3 g/dL (ref 6.0–8.3)

## 2023-08-23 LAB — CBC WITH DIFFERENTIAL/PLATELET
Basophils Absolute: 0 10*3/uL (ref 0.0–0.1)
Basophils Relative: 0.8 % (ref 0.0–3.0)
Eosinophils Absolute: 0.1 10*3/uL (ref 0.0–0.7)
Eosinophils Relative: 2.6 % (ref 0.0–5.0)
HCT: 46.8 % — ABNORMAL HIGH (ref 36.0–46.0)
Hemoglobin: 15.6 g/dL — ABNORMAL HIGH (ref 12.0–15.0)
Lymphocytes Relative: 24.1 % (ref 12.0–46.0)
Lymphs Abs: 1.2 10*3/uL (ref 0.7–4.0)
MCHC: 33.2 g/dL (ref 30.0–36.0)
MCV: 93.9 fL (ref 78.0–100.0)
Monocytes Absolute: 0.3 10*3/uL (ref 0.1–1.0)
Monocytes Relative: 6.5 % (ref 3.0–12.0)
Neutro Abs: 3.2 10*3/uL (ref 1.4–7.7)
Neutrophils Relative %: 66 % (ref 43.0–77.0)
Platelets: 196 10*3/uL (ref 150.0–400.0)
RBC: 4.98 Mil/uL (ref 3.87–5.11)
RDW: 13.6 % (ref 11.5–15.5)
WBC: 4.9 10*3/uL (ref 4.0–10.5)

## 2023-08-23 LAB — LIPID PANEL
Cholesterol: 253 mg/dL — ABNORMAL HIGH (ref 0–200)
HDL: 125.1 mg/dL (ref >39.00)
LDL Cholesterol: 113 mg/dL — ABNORMAL HIGH (ref 0–99)
NonHDL: 127.73
Total CHOL/HDL Ratio: 2
Triglycerides: 73 mg/dL (ref 0.0–149.0)
VLDL: 14.6 mg/dL (ref 0.0–40.0)

## 2023-08-23 LAB — HEMOGLOBIN A1C: Hgb A1c MFr Bld: 5.3 % (ref 4.6–6.5)

## 2023-08-23 LAB — TSH: TSH: 3.02 u[IU]/mL (ref 0.35–5.50)

## 2023-08-23 LAB — FOLATE: Folate: 3.7 ng/mL — ABNORMAL LOW (ref >5.9)

## 2023-08-23 LAB — VITAMIN D 25 HYDROXY (VIT D DEFICIENCY, FRACTURES): VITD: 33.96 ng/mL (ref 30.00–100.00)

## 2023-08-23 NOTE — Patient Instructions (Addendum)
Annual covid and flu boosters   Shingrix is the new shingles shot, 2 shots over 2-6 months, confirm coverage with insurance and document, then can return here for shots with nurse appt or at pharmacy   Preventive Care 61-61 Years Old, Female Preventive care refers to lifestyle choices and visits with your health care provider that can promote health and wellness. Preventive care visits are also called wellness exams. What can I expect for my preventive care visit? Counseling Your health care provider may ask you questions about your: Medical history, including: Past medical problems. Family medical history. Pregnancy history. Current health, including: Menstrual cycle. Method of birth control. Emotional well-being. Home life and relationship well-being. Sexual activity and sexual health. Lifestyle, including: Alcohol, nicotine or tobacco, and drug use. Access to firearms. Diet, exercise, and sleep habits. Work and work Astronomer. Sunscreen use. Safety issues such as seatbelt and bike helmet use. Physical exam Your health care provider will check your: Height and weight. These may be used to calculate your BMI (body mass index). BMI is a measurement that tells if you are at a healthy weight. Waist circumference. This measures the distance around your waistline. This measurement also tells if you are at a healthy weight and may help predict your risk of certain diseases, such as type 2 diabetes and high blood pressure. Heart rate and blood pressure. Body temperature. Skin for abnormal spots. What immunizations do I need?  Vaccines are usually given at various ages, according to a schedule. Your health care provider will recommend vaccines for you based on your age, medical history, and lifestyle or other factors, such as travel or where you work. What tests do I need? Screening Your health care provider may recommend screening tests for certain conditions. This may  include: Lipid and cholesterol levels. Diabetes screening. This is done by checking your blood sugar (glucose) after you have not eaten for a while (fasting). Pelvic exam and Pap test. Hepatitis B test. Hepatitis C test. HIV (human immunodeficiency virus) test. STI (sexually transmitted infection) testing, if you are at risk. Lung cancer screening. Colorectal cancer screening. Mammogram. Talk with your health care provider about when you should start having regular mammograms. This may depend on whether you have a family history of breast cancer. BRCA-related cancer screening. This may be done if you have a family history of breast, ovarian, tubal, or peritoneal cancers. Bone density scan. This is done to screen for osteoporosis. Talk with your health care provider about your test results, treatment options, and if necessary, the need for more tests. Follow these instructions at home: Eating and drinking  Eat a diet that includes fresh fruits and vegetables, whole grains, lean protein, and low-fat dairy products. Take vitamin and mineral supplements as recommended by your health care provider. Do not drink alcohol if: Your health care provider tells you not to drink. You are pregnant, may be pregnant, or are planning to become pregnant. If you drink alcohol: Limit how much you have to 0-1 drink a day. Know how much alcohol is in your drink. In the [place] one drink equals one 12 oz bottle of beer (355 mL), one 5 oz glass of wine (148 mL), or one 1 oz glass of hard liquor (44 mL). Lifestyle Brush your teeth every morning and night with fluoride toothpaste. Floss one time each day. Exercise for at least 30 minutes 5 or more days each week. Do not use any products that contain nicotine or tobacco. These products include cigarettes, chewing  tobacco, and vaping devices, such as e-cigarettes. If you need help quitting, ask your health care provider. Do not use drugs. If you are sexually  active, practice safe sex. Use a condom or other form of protection to prevent STIs. If you do not wish to become pregnant, use a form of birth control. If you plan to become pregnant, see your health care provider for a prepregnancy visit. Take aspirin only as told by your health care provider. Make sure that you understand how much to take and what form to take. Work with your health care provider to find out whether it is safe and beneficial for you to take aspirin daily. Find healthy ways to manage stress, such as: Meditation, yoga, or listening to music. Journaling. Talking to a trusted person. Spending time with friends and family. Minimize exposure to UV radiation to reduce your risk of skin cancer. Safety Always wear your seat belt while driving or riding in a vehicle. Do not drive: If you have been drinking alcohol. Do not ride with someone who has been drinking. When you are tired or distracted. While texting. If you have been using any mind-altering substances or drugs. Wear a helmet and other protective equipment during sports activities. If you have firearms in your house, make sure you follow all gun safety procedures. Seek help if you have been physically or sexually abused. What's next? Visit your health care provider once a year for an annual wellness visit. Ask your health care provider how often you should have your eyes and teeth checked. Stay up to date on all vaccines. This information is not intended to replace advice given to you by your health care provider. Make sure you discuss any questions you have with your health care provider. Document Revised: 01/12/2021 Document Reviewed: 01/12/2021 Elsevier Patient Education  2024 [employer].

## 2023-08-25 ENCOUNTER — Encounter: Payer: Self-pay | Admitting: Family Medicine

## 2023-08-25 NOTE — Progress Notes (Signed)
Subjective:    Patient ID: Sheryl [name], female    DOB: [birth year], 63 y.o.   MRN: [id number]  Chief Complaint  Patient presents with  . Annual Exam    HPI Discussed the use of AI scribe software for clinical note transcription with the patient, who gave verbal consent to proceed.  History of Present Illness   The patient, with a history of hypertension, presents with elevated blood pressure. She admits to not monitoring her blood pressure at home due to feeling well. She reports difficulty sleeping and admits to consuming a bottle of wine nightly, which she acknowledges could be affecting her blood pressure. She expresses a desire to reduce her alcohol consumption.  In addition to her hypertension, the patient reports issues with her jaw, describing pain and difficulty eating at times. She states that it feels like food gets stuck and the pain can be severe. This issue is suspected to be TMJ.  The patient also has a skin lesion on her belly that she has tried to burn off. She has not seen a dermatologist for this issue. She is overdue for a colonoscopy and has a history of polyps. She is currently on metoprolol, which she takes at night.        Past Medical History:  Diagnosis Date  . Anemia   . Anxiety   . Anxiety and depression    Follows with Dr Valinda Hoar q 6 months Has taken Zoloft in past without good results   . Chicken pox as a child  . Depression   . Elevated BP 02/06/2012  . FH: colon cancer 06/07/2011  . GERD (gastroesophageal reflux disease)   . 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 Hyperlipidemia 05/08/2012  . Hypertension   . Osteopenia 02/27/2017  . Overweight(278.02)   . Preventative health care 06/07/2011  . Sacral pain 05/08/2012  . Tachycardia 11/10/2014  . Vitamin D deficiency 05/09/2014    Past Surgical History:  Procedure Laterality Date  . CESAREAN SECTION  1985 and 1989   X 2  . COLONOSCOPY    . GUM SURGERY  2007  .  PARATHYROIDECTOMY Left 05/16/2019   Procedure: LEFT PARATHYROIDECTOMY;  Surgeon: Darnell Level, MD;  Location: [facility];  Service: General;  Laterality: Left;  . right hand surgery  2009   infection debrided    Family History  Problem Relation Age of Onset  . Heart attack Mother   . Diabetes Mother        type 2  . Hypertension Mother   . Hyperlipidemia Mother   . Heart disease Mother   . Cancer Father        colon  . Heart attack Father   . Colon cancer Father   . Hypertension Sister   . Diabetes Sister        type 2  . Hyperlipidemia Sister   . Heart disease Sister   . Hypertension Sister   . Arthritis Sister   . Diabetes Maternal Grandmother   . Hypertension Maternal Grandmother   . Hyperlipidemia Maternal Grandmother     Social History   Socioeconomic History  . Marital status: Married    Spouse name: Not on file  . Number of children: Not on file  . Years of education: Not on file  . Highest education level: Not on file  Occupational History  . Not on file  Tobacco Use  . Smoking status: Never  . Smokeless tobacco: Never  Vaping Use  .  Vaping status: Never Used  Substance and Sexual Activity  . Alcohol use: Yes    Alcohol/week: 35.0 standard drinks of alcohol    Types: 21 Glasses of wine, 14 Standard drinks or equivalent per week    Comment: glass of wine nightly  . Drug use: No  . Sexual activity: Yes    Partners: Male    Comment: no dietary restrictions, lives with husband  Other Topics Concern  . Not on file  Social History Narrative  . Not on file   Social Drivers of Health   Financial Resource Strain: Not on file  Food Insecurity: Not on file  Transportation Needs: Not on file  Physical Activity: Not on file  Stress: Not on file  Social Connections: Not on file  Intimate Partner Violence: Not on file    Outpatient Medications Prior to Visit  Medication Sig Dispense Refill  . atorvastatin (LIPITOR) 10 MG tablet TAKE 1 TABLET (10 MG TOTAL)  BY MOUTH DAILY AT 6 PM. 90 tablet 1  . metoprolol succinate (TOPROL-XL) 25 MG 24 hr tablet TAKE 1 TABLET (25 MG TOTAL) BY MOUTH DAILY. TAKE WITH 50MG  TO EQUAL 75MG  90 tablet 1  . metoprolol succinate (TOPROL-XL) 50 MG 24 hr tablet TAKE 1 TABLET BY MOUTH EVERY DAY WITH OR IMMEDIATELY FOLLOWING A MEAL 90 tablet 1  . pantoprazole (PROTONIX) 40 MG tablet TAKE 1 TABLET (40 MG TOTAL) BY MOUTH DAILY AS NEEDED FOR ACID REFLUX 90 tablet 1  . sertraline (ZOLOFT) 100 MG tablet TAKE 1.5 TABLETS (150 MG TOTAL) BY MOUTH DAILY 135 tablet 1   No facility-administered medications prior to visit.    Allergies  Allergen Reactions  . Hydrochlorothiazide Other (See Comments)    Had weird dreams and made her feel bad.  . Penicillins Rash    Did it involve swelling of the face/tongue/throat, SOB, or low BP? No Did it involve sudden or severe rash/hives, skin peeling, or any reaction on the inside of your mouth or nose? No Did you need to seek medical attention at a hospital or doctor's office? No When did it last happen?      childhood allergy If all above answers are "NO", may proceed with cephalosporin use.     Review of Systems  Constitutional:  Negative for chills, fever and malaise/fatigue.  HENT:  Negative for congestion and hearing loss.   Eyes:  Negative for discharge.  Respiratory:  Negative for cough, sputum production and shortness of breath.   Cardiovascular:  Negative for chest pain, palpitations and leg swelling.  Gastrointestinal:  Negative for abdominal pain, blood in stool, constipation, diarrhea, heartburn, nausea and vomiting.  Genitourinary:  Negative for dysuria, frequency, hematuria and urgency.  Musculoskeletal:  Positive for joint pain. Negative for back pain, falls and myalgias.  Skin:  Positive for rash.  Neurological:  Negative for dizziness, sensory change, loss of consciousness, weakness and headaches.  Endo/Heme/Allergies:  Negative for environmental allergies. Does not  bruise/bleed easily.  Psychiatric/Behavioral:  Negative for depression and suicidal ideas. The patient is nervous/anxious and has insomnia.        Objective:    Physical Exam Constitutional:      General: She is not in acute distress.    Appearance: Normal appearance. She is not diaphoretic.  HENT:     Head: Normocephalic and atraumatic.     Right Ear: Tympanic membrane, ear canal and external ear normal.     Left Ear: Tympanic membrane, ear canal and external ear normal.  Nose: Nose normal.     Mouth/Throat:     Mouth: Mucous membranes are moist.     Pharynx: Oropharynx is clear. No oropharyngeal exudate.  Eyes:     General: No scleral icterus.       Right eye: No discharge.        Left eye: No discharge.     Conjunctiva/sclera: Conjunctivae normal.     Pupils: Pupils are equal, round, and reactive to light.  Neck:     Thyroid: No thyromegaly.  Cardiovascular:     Rate and Rhythm: Normal rate and regular rhythm.     Heart sounds: Normal heart sounds. No murmur heard. Pulmonary:     Effort: Pulmonary effort is normal. No respiratory distress.     Breath sounds: Normal breath sounds. No wheezing or rales.  Abdominal:     General: Bowel sounds are normal. There is no distension.     Palpations: Abdomen is soft. There is no mass.     Tenderness: There is no abdominal tenderness.  Musculoskeletal:        General: No tenderness. Normal range of motion.     Cervical back: Normal range of motion and neck supple.  Lymphadenopathy:     Cervical: No cervical adenopathy.  Skin:    General: Skin is warm and dry.     Findings: No rash.  Neurological:     General: No focal deficit present.     Mental Status: She is alert and oriented to person, place, and time.     Cranial Nerves: No cranial nerve deficit.     Coordination: Coordination normal.     Deep Tendon Reflexes: Reflexes are normal and symmetric. Reflexes normal.  Psychiatric:        Mood and Affect: Mood normal.         Behavior: Behavior normal.        Thought Content: Thought content normal.        Judgment: Judgment normal.    BP (!) 142/88   Pulse (!) 110   Temp 98.1 F (36.7 C) (Oral)   Resp 20   Ht 5\' 1"  (1.549 m)   Wt 168 lb 12.8 oz (76.6 kg)   LMP 09/08/2011   SpO2 99%   BMI 31.89 kg/m  Wt Readings from Last 3 Encounters:  08/23/23 168 lb 12.8 oz (76.6 kg)  01/30/23 169 lb 3.2 oz (76.7 kg)  11/20/22 170 lb (77.1 kg)    Diabetic Foot Exam - Simple   No data filed    Lab Results  Component Value Date   WBC 4.9 08/23/2023   HGB 15.6 (H) 08/23/2023   HCT 46.8 (H) 08/23/2023   PLT 196.0 08/23/2023   GLUCOSE 107 (H) 08/23/2023   CHOL 253 (H) 08/23/2023   TRIG 73.0 08/23/2023   HDL 125.10 08/23/2023   LDLDIRECT 103.8 01/10/2013   LDLCALC 113 (H) 08/23/2023   ALT 13 08/23/2023   AST 20 08/23/2023   NA 142 08/23/2023   K 5.1 08/23/2023   CL 103 08/23/2023   CREATININE 0.79 08/23/2023   BUN 17 08/23/2023   CO2 30 08/23/2023   TSH 3.02 08/23/2023   HGBA1C 5.3 08/23/2023    Lab Results  Component Value Date   TSH 3.02 08/23/2023   Lab Results  Component Value Date   WBC 4.9 08/23/2023   HGB 15.6 (H) 08/23/2023   HCT 46.8 (H) 08/23/2023   MCV 93.9 08/23/2023   PLT 196.0 08/23/2023   Lab Results  Component Value Date   NA 142 08/23/2023   K 5.1 08/23/2023   CO2 30 08/23/2023   GLUCOSE 107 (H) 08/23/2023   BUN 17 08/23/2023   CREATININE 0.79 08/23/2023   BILITOT 0.8 08/23/2023   ALKPHOS 69 08/23/2023   AST 20 08/23/2023   ALT 13 08/23/2023   PROT 7.3 08/23/2023   ALBUMIN 4.7 08/23/2023   CALCIUM 9.7 08/23/2023   ANIONGAP 8 05/13/2019   GFR 81.34 08/23/2023   Lab Results  Component Value Date   CHOL 253 (H) 08/23/2023   Lab Results  Component Value Date   HDL 125.10 08/23/2023   Lab Results  Component Value Date   LDLCALC 113 (H) 08/23/2023   Lab Results  Component Value Date   TRIG 73.0 08/23/2023   Lab Results  Component Value Date    CHOLHDL 2 08/23/2023   Lab Results  Component Value Date   HGBA1C 5.3 08/23/2023       Assessment & Plan:  Annual physical exam Assessment & Plan: Patient encouraged to maintain heart healthy diet, regular exercise, adequate sleep. Consider daily probiotics. Take medications as prescribed. Labs ordered and reviewed. MGM  2020 needs one will order. Pap 2020 repeat at next visit   . Colonoscopy  2012 needs repeat colonoscopy. Dexa May 2024. Shingrix is the new shingles shot, 2 shots over 2-6 months, confirm coverage with insurance and document, then can return here for shots with nurse appt or at pharmacy   Anxiety and depression Assessment & Plan: On Zoloft 150 mg daily, doing well   Orders: -     CBC with Differential/Platelet -     TSH  Hyperglycemia Assessment & Plan: hgba1c acceptable, minimize simple carbs. Increase exercise as tolerated.  Orders: -     Comprehensive metabolic panel -     CBC with Differential/Platelet -     Hemoglobin A1c -     TSH  Mixed hyperlipidemia Assessment & Plan: Tolerating statin, encouraged heart healthy diet, avoid trans fats, minimize simple carbs and saturated fats. Increase exercise as tolerated  Orders: -     Lipid panel  Hyperparathyroidism (HCC) Assessment & Plan: Hydrate and monitor    Vitamin D insufficiency Assessment & Plan: Supplement and monitor   Orders: -     VITAMIN D 25 Hydroxy (Vit-D Deficiency, Fractures)  Need for influenza vaccination -     Flu vaccine trivalent PF, 6mos and older(Flulaval,Afluria,Fluarix,Fluzone)  Sun-damaged skin -     Ambulatory referral to Dermatology  Skin lesion -     CBC with Differential/Platelet -     Ambulatory referral to Dermatology  Colon cancer screening -     Ambulatory referral to Gastroenterology  Excessive drinking alcohol -     Vitamin B1 -     Folate    Assessment and Plan    Alcohol Use Disorder Daily consumption of a bottle of alcohol. Discussed the  risks associated with chronic alcohol use including hypertension, liver disease, and increased cancer risk. Patient expressed interest in reducing alcohol consumption. -Consider behavioral health counseling for alcohol cravings and strategies for quitting. -Consider medications to help with alcohol cravings. -Consider providing a limited supply of Xanax for anxiety related to alcohol withdrawal, with caution due to potential for misuse. -Consider referral to inpatient or outpatient alcohol treatment programs. Patient declined all formal recommendations for now and agrees to cut back by roughly one glass weekly over next month -Schedule follow-up in 6 weeks to assess progress.  Hypertension Blood pressure  elevated, potentially related to alcohol use. Discussed the potential for improved blood pressure control with reduced alcohol consumption. -Continue Metoprolol, monitor blood pressure and pulse at home. -Check blood pressure every couple of days at home, report numbers to provider. -Consider increasing Metoprolol dose if pulse remains normal and blood pressure remains high.  Temporomandibular Joint (TMJ) Disorder Complaints of difficulty eating and pain in the jaw. Likely arthritis in the TMJ joint. -Consider using a night guard as recommended by dentist. -Consider using topical anti-inflammatory creams (Aspirin cream, Voltaren gel) and lidocaine gel for pain relief.  Skin Lesion Irritating skin lesion on the abdomen. History of attempted removal with scar tissue present. -Refer to dermatology for evaluation and potential removal.  General Health Maintenance -Continue annual mammograms, next due June 2025. -Overdue for colonoscopy, refer to gastroenterology for scheduling. -Consider Pap smear at next visit. -Check Vitamin D, sugar, kidney function, liver function, thiamine, and folic acid levels. -Start multivitamin with minerals or folic acid and thiamine supplementation, especially  during alcohol withdrawal. -Consider Shingles vaccine in the future. -Received flu shot, consider COVID booster. -Encourage regular protein intake and hydration.         Danise Edge, MD

## 2023-08-28 LAB — VITAMIN B1: Vitamin B1 (Thiamine): 6 nmol/L — ABNORMAL LOW (ref 8–30)

## 2023-08-29 ENCOUNTER — Encounter: Payer: Self-pay | Admitting: Internal Medicine

## 2023-08-29 ENCOUNTER — Other Ambulatory Visit: Payer: Self-pay | Admitting: Emergency Medicine

## 2023-08-29 ENCOUNTER — Telehealth: Payer: Self-pay

## 2023-08-29 DIAGNOSIS — E782 Mixed hyperlipidemia: Secondary | ICD-10-CM

## 2023-08-29 DIAGNOSIS — L989 Disorder of the skin and subcutaneous tissue, unspecified: Secondary | ICD-10-CM

## 2023-08-29 DIAGNOSIS — F32A Depression, unspecified: Secondary | ICD-10-CM

## 2023-08-29 DIAGNOSIS — F1010 Alcohol abuse, uncomplicated: Secondary | ICD-10-CM

## 2023-08-29 DIAGNOSIS — R739 Hyperglycemia, unspecified: Secondary | ICD-10-CM

## 2023-08-29 NOTE — Telephone Encounter (Signed)
Called and spoke with patient. Appointment has been made and labs ordered

## 2023-08-29 NOTE — Telephone Encounter (Signed)
Copied from CRM [id number]. Topic: Clinical - Lab/Test Results >> Aug 28, 2023  4:07 PM Corin V wrote: Reason for CRM: Patient returned missed call to schedule a 3 month recheck of her labs per note in chart. Please order the labs needing to be rechecked.

## 2023-10-08 ENCOUNTER — Ambulatory Visit (AMBULATORY_SURGERY_CENTER): Payer: 59

## 2023-10-08 VITALS — Ht 61.0 in | Wt 163.0 lb

## 2023-10-08 DIAGNOSIS — F1010 Alcohol abuse, uncomplicated: Secondary | ICD-10-CM | POA: Insufficient documentation

## 2023-10-08 DIAGNOSIS — Z1211 Encounter for screening for malignant neoplasm of colon: Secondary | ICD-10-CM

## 2023-10-08 DIAGNOSIS — E538 Deficiency of other specified B group vitamins: Secondary | ICD-10-CM | POA: Insufficient documentation

## 2023-10-08 DIAGNOSIS — Z860100 Personal history of colon polyps, unspecified: Secondary | ICD-10-CM

## 2023-10-08 DIAGNOSIS — E519 Thiamine deficiency, unspecified: Secondary | ICD-10-CM | POA: Insufficient documentation

## 2023-10-08 MED ORDER — NA SULFATE-K SULFATE-MG SULF 17.5-3.13-1.6 GM/177ML PO SOLN: 1.0000 | Freq: Once | ORAL | 0 refills | Status: DC

## 2023-10-08 NOTE — Assessment & Plan Note (Signed)
 Encouraged DASH or MIND diet, decrease po intake and increase exercise as tolerated. Needs 7-8 hours of sleep nightly. Avoid trans fats, eat small, frequent meals every 4-5 hours with lean proteins, complex carbs and healthy fats. Minimize simple carbs, high fat foods and processed foods

## 2023-10-08 NOTE — Progress Notes (Signed)

## 2023-10-08 NOTE — Assessment & Plan Note (Signed)
 Tolerating statin, encouraged heart healthy diet, avoid trans fats, minimize simple carbs and saturated fats. Increase exercise as tolerated

## 2023-10-08 NOTE — Assessment & Plan Note (Signed)
 Supplement and monitor

## 2023-10-08 NOTE — Assessment & Plan Note (Signed)
 She has been struggling with alcohol for past year

## 2023-10-09 ENCOUNTER — Telehealth: Payer: 59 | Admitting: Family Medicine

## 2023-10-09 ENCOUNTER — Encounter: Payer: Self-pay | Admitting: Family Medicine

## 2023-10-09 VITALS — BP 132/96 | HR 105 | Ht 62.0 in | Wt 163.0 lb

## 2023-10-09 DIAGNOSIS — E669 Obesity, unspecified: Secondary | ICD-10-CM

## 2023-10-09 DIAGNOSIS — E782 Mixed hyperlipidemia: Secondary | ICD-10-CM | POA: Diagnosis not present

## 2023-10-09 DIAGNOSIS — E559 Vitamin D deficiency, unspecified: Secondary | ICD-10-CM | POA: Diagnosis not present

## 2023-10-09 DIAGNOSIS — F1010 Alcohol abuse, uncomplicated: Secondary | ICD-10-CM

## 2023-10-09 DIAGNOSIS — Z6829 Body mass index (BMI) 29.0-29.9, adult: Secondary | ICD-10-CM | POA: Diagnosis not present

## 2023-10-09 DIAGNOSIS — E538 Deficiency of other specified B group vitamins: Secondary | ICD-10-CM

## 2023-10-09 DIAGNOSIS — E519 Thiamine deficiency, unspecified: Secondary | ICD-10-CM

## 2023-10-09 MED ORDER — PANTOPRAZOLE SODIUM 40 MG PO TBEC: 40.0000 mg | DELAYED_RELEASE_TABLET | Freq: Every day | ORAL | 1 refills | Status: AC

## 2023-10-09 MED ORDER — METOPROLOL SUCCINATE ER 50 MG PO TB24: 50.0000 mg | ORAL_TABLET | Freq: Every day | ORAL | 1 refills | Status: DC

## 2023-10-09 MED ORDER — SERTRALINE HCL 100 MG PO TABS: 150.0000 mg | ORAL_TABLET | Freq: Every day | ORAL | 1 refills | Status: DC

## 2023-10-09 NOTE — Progress Notes (Signed)
 MyChart Video Visit    Virtual Visit via Video Note   This patient is at least at moderate risk for complications without adequate follow up. This format is felt to be most appropriate for this patient at this time. Physical exam was limited by quality of the video and audio technology used for the visit. Juanetta, CMA was able to get the patient set up on a video visit.  Patient location: home Patient and provider in visit Provider location: Office  I discussed the limitations of evaluation and management by telemedicine and the availability of in person appointments. The patient expressed understanding and agreed to proceed.  Visit Date: 10/09/2023  Today's healthcare provider: Danise Edge, MD  Subjective:    Patient ID: Sheryl [name], female    DOB: 12/26/1960, 63 y.o.   MRN: [id number]  Chief Complaint  Patient presents with   Follow-up    HPI Discussed the use of AI scribe software for clinical note transcription with the patient, who gave verbal consent to proceed.  History of Present Illness The patient presents for follow-up on alcohol consumption and medication management.  She has made significant progress in reducing alcohol consumption. Initially, she abstained completely, then limited herself to one glass, and now consumes up to two glasses at most. Some days, she does not consume any alcohol. She experienced initial side effects of shakiness and anxiety when cutting back, but these symptoms have largely resolved.  She is currently taking metoprolol 50 mg for blood pressure management, which she started after beginning a new job that increased her stress levels. She no longer takes the 25 mg dose. She continues to fill her prescriptions [facility].  She takes Protonix daily to manage acid reflux, which occurs if she misses a dose.  She is on sertraline (Zoloft), taking one and a half tablets daily, which she finds effective.  She inquired about using  Wegovy for weight loss. She is considering these options if insurance coverage is confirmed.  General health maintenance strategies were discussed, including increased physical activity, adequate sleep, hydration, and a balanced diet with more protein and fewer carbohydrates.    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   Hyperlipidemia 05/08/2012   Hypertension    Osteopenia 02/27/2017   Overweight(278.02)    Preventative health care 06/07/2011   Sacral pain 05/08/2012   Tachycardia 11/10/2014   Vitamin D deficiency 05/09/2014    Past Surgical History:  Procedure Laterality Date   CESAREAN SECTION  1985 and 1989   X 2   COLONOSCOPY     GUM SURGERY  2007   PARATHYROIDECTOMY Left 05/16/2019   Procedure: LEFT PARATHYROIDECTOMY;  Surgeon: Darnell Level, MD;  Location: [facility];  Service: General;  Laterality: Left;   right hand surgery  2009   infection debrided    Family History  Problem Relation Age of Onset   Heart attack Mother    Diabetes Mother        type 2   Hypertension Mother    Hyperlipidemia Mother    Heart disease Mother  Cancer Father        colon   Heart attack Father    Colon cancer Father    Hypertension Sister    Diabetes Sister        type 2   Hyperlipidemia Sister    Heart disease Sister    Hypertension Sister    Arthritis Sister    Diabetes Maternal Grandmother    Hypertension Maternal Grandmother    Hyperlipidemia Maternal Grandmother    Rectal cancer Neg Hx    Stomach cancer Neg Hx     Social History   Socioeconomic History   Marital status: Married    Spouse name: Not on file   Number of children: Not on file   Years of  education: Not on file   Highest education level: Not on file  Occupational History   Not on file  Tobacco Use   Smoking status: Never   Smokeless tobacco: Never  Vaping Use   Vaping status: Never Used  Substance and Sexual Activity   Alcohol use: Yes    Alcohol/week: 35.0 standard drinks of alcohol    Types: 21 Glasses of wine, 14 Standard drinks or equivalent per week    Comment: glass of wine nightly   Drug use: No   Sexual activity: Yes    Partners: Male    Comment: no dietary restrictions, lives with husband  Other Topics Concern   Not on file  Social History Narrative   Not on file   Social Drivers of Health   Financial Resource Strain: Not on file  Food Insecurity: Not on file  Transportation Needs: Not on file  Physical Activity: Not on file  Stress: Not on file  Social Connections: Not on file  Intimate Partner Violence: Not on file    Outpatient Medications Prior to Visit  Medication Sig Dispense Refill   atorvastatin (LIPITOR) 10 MG tablet TAKE 1 TABLET (10 MG TOTAL) BY MOUTH DAILY AT 6 PM. 90 tablet 1   metoprolol succinate (TOPROL-XL) 25 MG 24 hr tablet TAKE 1 TABLET (25 MG TOTAL) BY MOUTH DAILY. TAKE WITH 50MG  TO EQUAL 75MG  90 tablet 1   metoprolol succinate (TOPROL-XL) 50 MG 24 hr tablet TAKE 1 TABLET BY MOUTH EVERY DAY WITH OR IMMEDIATELY FOLLOWING A MEAL 90 tablet 1   Na Sulfate-K Sulfate-Mg Sulfate concentrate (SUPREP) 17.5-3.13-1.6 GM/177ML SOLN Take 1 kit (354 mLs total) by mouth once for 1 dose. 354 mL 0   pantoprazole (PROTONIX) 40 MG tablet TAKE 1 TABLET (40 MG TOTAL) BY MOUTH DAILY AS NEEDED FOR ACID REFLUX 90 tablet 1   sertraline (ZOLOFT) 100 MG tablet TAKE 1.5 TABLETS (150 MG TOTAL) BY MOUTH DAILY 135 tablet 1   No facility-administered medications prior to visit.    Allergies  Allergen Reactions   Hydrochlorothiazide Other (See Comments)    Had weird dreams and made her feel bad.   Penicillins Rash    Did it involve swelling of the  face/tongue/throat, SOB, or low BP? No Did it involve sudden or severe rash/hives, skin peeling, or any reaction on the inside of your mouth or nose? No Did you need to seek medical attention at a hospital or doctor's office? No When did it last happen?      childhood allergy If all above answers are "NO", may proceed with cephalosporin use.     Review of Systems  Constitutional:  Positive for malaise/fatigue. Negative for fever.  HENT:  Negative for congestion.   Eyes:  Negative  for blurred vision.  Respiratory:  Negative for shortness of breath.   Cardiovascular:  Negative for chest pain, palpitations and leg swelling.  Gastrointestinal:  Negative for abdominal pain, blood in stool and nausea.  Genitourinary:  Negative for dysuria and frequency.  Musculoskeletal:  Negative for falls.  Skin:  Negative for rash.  Neurological:  Negative for dizziness, loss of consciousness and headaches.  Endo/Heme/Allergies:  Negative for environmental allergies.  Psychiatric/Behavioral:  Negative for depression. The patient is not nervous/anxious.        Objective:    Physical Exam Constitutional:      General: She is not in acute distress.    Appearance: Normal appearance. She is not ill-appearing or toxic-appearing.  HENT:     Head: Normocephalic and atraumatic.     Right Ear: External ear normal.     Left Ear: External ear normal.     Nose: Nose normal.  Eyes:     General:        Right eye: No discharge.        Left eye: No discharge.  Pulmonary:     Effort: Pulmonary effort is normal.  Skin:    Findings: No rash.  Neurological:     Mental Status: She is alert and oriented to person, place, and time.  Psychiatric:        Behavior: Behavior normal.     BP (!) 132/96 Comment: Pt obtained  Pulse (!) 105 Comment: Pt obtained  Ht 5\' 2"  (1.575 m) Comment: Pt stated  Wt 163 lb (73.9 kg) Comment: Pt stated  LMP 09/08/2011   BMI 29.81 kg/m  Wt Readings from Last 3 Encounters:   10/09/23 163 lb (73.9 kg)  10/08/23 163 lb (73.9 kg)  08/23/23 168 lb 12.8 oz (76.6 kg)    Diabetic Foot Exam - Simple   No data filed    Lab Results  Component Value Date   WBC 4.9 08/23/2023   HGB 15.6 (H) 08/23/2023   HCT 46.8 (H) 08/23/2023   PLT 196.0 08/23/2023   GLUCOSE 107 (H) 08/23/2023   CHOL 253 (H) 08/23/2023   TRIG 73.0 08/23/2023   HDL 125.10 08/23/2023   LDLDIRECT 103.8 01/10/2013   LDLCALC 113 (H) 08/23/2023   ALT 13 08/23/2023   AST 20 08/23/2023   NA 142 08/23/2023   K 5.1 08/23/2023   CL 103 08/23/2023   CREATININE 0.79 08/23/2023   BUN 17 08/23/2023   CO2 30 08/23/2023   TSH 3.02 08/23/2023   HGBA1C 5.3 08/23/2023    Lab Results  Component Value Date   TSH 3.02 08/23/2023   Lab Results  Component Value Date   WBC 4.9 08/23/2023   HGB 15.6 (H) 08/23/2023   HCT 46.8 (H) 08/23/2023   MCV 93.9 08/23/2023   PLT 196.0 08/23/2023   Lab Results  Component Value Date   NA 142 08/23/2023   K 5.1 08/23/2023   CO2 30 08/23/2023   GLUCOSE 107 (H) 08/23/2023   BUN 17 08/23/2023   CREATININE 0.79 08/23/2023   BILITOT 0.8 08/23/2023   ALKPHOS 69 08/23/2023   AST 20 08/23/2023   ALT 13 08/23/2023   PROT 7.3 08/23/2023   ALBUMIN 4.7 08/23/2023   CALCIUM 9.7 08/23/2023   ANIONGAP 8 05/13/2019   GFR 81.34 08/23/2023   Lab Results  Component Value Date   CHOL 253 (H) 08/23/2023   Lab Results  Component Value Date   HDL 125.10 08/23/2023   Lab Results  Component Value  Date   LDLCALC 113 (H) 08/23/2023   Lab Results  Component Value Date   TRIG 73.0 08/23/2023   Lab Results  Component Value Date   CHOLHDL 2 08/23/2023   Lab Results  Component Value Date   HGBA1C 5.3 08/23/2023       Assessment & Plan:  Mixed hyperlipidemia Assessment & Plan: Tolerating statin, encouraged heart healthy diet, avoid trans fats, minimize simple carbs and saturated fats. Increase exercise as tolerated   Obesity (BMI 30-39.9) Assessment &  Plan: Encouraged DASH or MIND diet, decrease po intake and increase exercise as tolerated. Needs 7-8 hours of sleep nightly. Avoid trans fats, eat small, frequent meals every 4-5 hours with lean proteins, complex carbs and healthy fats. Minimize simple carbs, high fat foods and processed foods.   Vitamin D insufficiency Assessment & Plan: Supplement and monitor    Excessive drinking of alcohol Assessment & Plan: She has been struggling with alcohol for past year   Folate deficiency Assessment & Plan: Supplement and monitor    Thiamine deficiency Assessment & Plan: Supplement and monitor    Other orders -     Metoprolol Succinate ER; Take 1 tablet (50 mg total) by mouth daily. Take with or immediately following a meal.  Dispense: 90 tablet; Refill: 1 -     Pantoprazole Sodium; Take 1 tablet (40 mg total) by mouth daily.  Dispense: 90 tablet; Refill: 1 -     Sertraline HCl; Take 1.5 tablets (150 mg total) by mouth daily.  Dispense: 135 tablet; Refill: 1    Assessment and Plan Assessment & Plan Alcohol Use Disorder Significant reduction in alcohol consumption with occasional intake. Discussed health impacts and benefits of low consumption. Advised zero to one drink for longevity and quality of life.  Hypertension Managing with metoprolol succinate 50 mg daily due to stress-related hypertension. - [facility]  Gastroesophageal Reflux Disease (GERD) Requires daily pantoprazole for symptom control. - Refill pantoprazole (Protonix) for daily use.  Anxiety and Depression Sertraline 150 mg daily effectively manages symptoms. - Refill sertraline (Zoloft) 150 mg daily.  Overweight BMI 29.8. Discussed weight loss medications and insurance coverage challenges. Encouraged lifestyle modifications. - Check with insurance regarding coverage for Wegovy or Zepbound for weight loss. - Continue lifestyle modifications: increase physical  activity, ensure adequate sleep, maintain hydration, and focus on a balanced diet with more protein and fewer carbohydrates.  Follow-up Current management plan effective. - Schedule follow-up appointment in the summer.     Danise Edge, MD

## 2023-10-09 NOTE — Patient Instructions (Signed)
 Wegovy or Zepbound for weight loss  DASH Eating Plan DASH stands for Dietary Approaches to Stop Hypertension. The DASH eating plan is a healthy eating plan that has been shown to: Lower high blood pressure (hypertension). Reduce your risk for type 2 diabetes, heart disease, and stroke. Help with weight loss. What are tips for following this plan? Reading food labels Check food labels for the amount of salt (sodium) per serving. Choose foods with less than 5 percent of the Daily Value (DV) of sodium. In general, foods with less than 300 milligrams (mg) of sodium per serving fit into this eating plan. To find whole grains, look for the word "whole" as the first word in the ingredient list. Shopping Buy products labeled as "low-sodium" or "no salt added." Buy fresh foods. Avoid canned foods and pre-made or frozen meals. Cooking Try not to add salt when you cook. Use salt-free seasonings or herbs instead of table salt or sea salt. Check with your health care provider or pharmacist before using salt substitutes. Do not fry foods. Cook foods in healthy ways, such as baking, boiling, grilling, roasting, or broiling. Cook using oils that are good for your heart. These include olive, canola, avocado, soybean, and sunflower oil. Meal planning  Eat a balanced diet. This should include: 4 or more servings of fruits and 4 or more servings of vegetables each day. Try to fill half of your plate with fruits and vegetables. 6-8 servings of whole grains each day. 6 or less servings of lean meat, poultry, or fish each day. 1 oz is 1 serving. A 3 oz (85 g) serving of meat is about the same size as the palm of your hand. One egg is 1 oz (28 g). 2-3 servings of low-fat dairy each day. One serving is 1 cup (237 mL). 1 serving of nuts, seeds, or beans 5 times each week. 2-3 servings of heart-healthy fats. Healthy fats called omega-3 fatty acids are found in foods such as walnuts, flaxseeds, fortified milks, and  eggs. These fats are also found in cold-water fish, such as sardines, salmon, and mackerel. Limit how much you eat of: Canned or prepackaged foods. Food that is high in trans fat, such as fried foods. Food that is high in saturated fat, such as fatty meat. Desserts and other sweets, sugary drinks, and other foods with added sugar. Full-fat dairy products. Do not salt foods before eating. Do not eat more than 4 egg yolks a week. Try to eat at least 2 vegetarian meals a week. Eat more home-cooked food and less restaurant, buffet, and fast food. Lifestyle When eating at a restaurant, ask if your food can be made with less salt or no salt. If you drink alcohol: Limit how much you have to: 0-1 drink a day if you are female. 0-2 drinks a day if you are female. Know how much alcohol is in your drink. In the [place] one drink is one 12 oz bottle of beer (355 mL), one 5 oz glass of wine (148 mL), or one 1 oz glass of hard liquor (44 mL). General information Avoid eating more than 2,300 mg of salt a day. If you have hypertension, you may need to reduce your sodium intake to 1,500 mg a day. Work with your provider to stay at a healthy body weight or lose weight. Ask what the best weight range is for you. On most days of the week, get at least 30 minutes of exercise that causes your heart to beat  faster. This may include walking, swimming, or biking. Work with your provider or dietitian to adjust your eating plan to meet your specific calorie needs. What foods should I eat? Fruits All fresh, dried, or frozen fruit. Canned fruits that are in their natural juice and do not have sugar added to them. Vegetables Fresh or frozen vegetables that are raw, steamed, roasted, or grilled. Low-sodium or reduced-sodium tomato and vegetable juice. Low-sodium or reduced-sodium tomato sauce and tomato paste. Low-sodium or reduced-sodium canned vegetables. Grains Whole-grain or whole-wheat bread. Whole-grain or  whole-wheat pasta. Brown rice. Orpah Cobb. Bulgur. Whole-grain and low-sodium cereals. Pita bread. Low-fat, low-sodium crackers. Whole-wheat flour tortillas. Meats and other proteins Skinless chicken or [place]. Ground chicken or [place]. Pork with fat trimmed off. Fish and seafood. Egg whites. Dried beans, peas, or lentils. Unsalted nuts, nut butters, and seeds. Unsalted canned beans. Lean cuts of beef with fat trimmed off. Low-sodium, lean precooked or cured meat, such as sausages or meat loaves. Dairy Low-fat (1%) or fat-free (skim) milk. Reduced-fat, low-fat, or fat-free cheeses. Nonfat, low-sodium ricotta or cottage cheese. Low-fat or nonfat yogurt. Low-fat, low-sodium cheese. Fats and oils Soft margarine without trans fats. Vegetable oil. Reduced-fat, low-fat, or light mayonnaise and salad dressings (reduced-sodium). Canola, safflower, olive, avocado, soybean, and sunflower oils. Avocado. Seasonings and condiments Herbs. Spices. Seasoning mixes without salt. Other foods Unsalted popcorn and pretzels. Fat-free sweets. The items listed above may not be all the foods and drinks you can have. Talk to a dietitian to learn more. What foods should I avoid? Fruits Canned fruit in a light or heavy syrup. Fried fruit. Fruit in cream or butter sauce. Vegetables Creamed or fried vegetables. Vegetables in a cheese sauce. Regular canned vegetables that are not marked as low-sodium or reduced-sodium. Regular canned tomato sauce and paste that are not marked as low-sodium or reduced-sodium. Regular tomato and vegetable juices that are not marked as low-sodium or reduced-sodium. Rosita Fire. Olives. Grains Baked goods made with fat, such as croissants, muffins, or some breads. Dry pasta or rice meal packs. Meats and other proteins Fatty cuts of meat. Ribs. Fried meat. Tomasa Blase. Bologna, salami, and other precooked or cured meats, such as sausages or meat loaves, that are not lean and low in sodium. Fat from the  back of a pig (fatback). Bratwurst. Salted nuts and seeds. Canned beans with added salt. Canned or smoked fish. Whole eggs or egg yolks. Chicken or [place] with skin. Dairy Whole or 2% milk, cream, and half-and-half. Whole or full-fat cream cheese. Whole-fat or sweetened yogurt. Full-fat cheese. Nondairy creamers. Whipped toppings. Processed cheese and cheese spreads. Fats and oils Butter. Stick margarine. Lard. Shortening. Ghee. Bacon fat. Tropical oils, such as coconut, palm kernel, or palm oil. Seasonings and condiments Onion salt, garlic salt, seasoned salt, table salt, and sea salt. Worcestershire sauce. Tartar sauce. Barbecue sauce. Teriyaki sauce. Soy sauce, including reduced-sodium soy sauce. Steak sauce. Canned and packaged gravies. Fish sauce. Oyster sauce. Cocktail sauce. Store-bought horseradish. Ketchup. Mustard. Meat flavorings and tenderizers. Bouillon cubes. Hot sauces. Pre-made or packaged marinades. Pre-made or packaged taco seasonings. Relishes. Regular salad dressings. Other foods Salted popcorn and pretzels. The items listed above may not be all the foods and drinks you should avoid. Talk to a dietitian to learn more. Where to find more information National Heart, Lung, and Blood Institute (NHLBI): BuffaloDryCleaner.gl American Heart Association (AHA): heart.org Academy of Nutrition and Dietetics: eatright.org National Kidney Foundation (NKF): kidney.org This information is not intended to replace advice given to you  by your health care provider. Make sure you discuss any questions you have with your health care provider. Document Revised: 08/03/2022 Document Reviewed: 08/03/2022 Elsevier Patient Education  2024 [employer].

## 2023-10-16 ENCOUNTER — Encounter: Payer: Self-pay | Admitting: Internal Medicine

## 2023-10-22 ENCOUNTER — Ambulatory Visit (AMBULATORY_SURGERY_CENTER): Payer: 59 | Admitting: Internal Medicine

## 2023-10-22 ENCOUNTER — Encounter: Payer: Self-pay | Admitting: Internal Medicine

## 2023-10-22 VITALS — BP 128/82 | HR 66 | Temp 97.3°F | Resp 16 | Ht 61.0 in | Wt 163.0 lb

## 2023-10-22 DIAGNOSIS — D122 Benign neoplasm of ascending colon: Secondary | ICD-10-CM

## 2023-10-22 DIAGNOSIS — Z800 Family history of malignant neoplasm of digestive organs: Secondary | ICD-10-CM | POA: Diagnosis not present

## 2023-10-22 DIAGNOSIS — D124 Benign neoplasm of descending colon: Secondary | ICD-10-CM

## 2023-10-22 DIAGNOSIS — D123 Benign neoplasm of transverse colon: Secondary | ICD-10-CM

## 2023-10-22 DIAGNOSIS — K644 Residual hemorrhoidal skin tags: Secondary | ICD-10-CM

## 2023-10-22 DIAGNOSIS — K648 Other hemorrhoids: Secondary | ICD-10-CM

## 2023-10-22 DIAGNOSIS — K5730 Diverticulosis of large intestine without perforation or abscess without bleeding: Secondary | ICD-10-CM | POA: Diagnosis not present

## 2023-10-22 DIAGNOSIS — Z1211 Encounter for screening for malignant neoplasm of colon: Secondary | ICD-10-CM | POA: Diagnosis present

## 2023-10-22 DIAGNOSIS — Z860100 Personal history of colon polyps, unspecified: Secondary | ICD-10-CM | POA: Diagnosis not present

## 2023-10-22 MED ORDER — SODIUM CHLORIDE 0.9 % IV SOLN: 500.0000 mL | Freq: Once | INTRAVENOUS | Status: DC

## 2023-10-22 NOTE — Progress Notes (Signed)
 GASTROENTEROLOGY PROCEDURE H&P NOTE   Primary Care Physician: Bradd Canary, MD    Reason for Procedure:  History of polyps  Plan:    Colonoscopy  Patient is appropriate for endoscopic procedure(s) in the ambulatory ([facility]) setting.  The nature of the procedure, as well as the risks, benefits, and alternatives were carefully and thoroughly reviewed with the patient. Ample time for discussion and questions allowed. The patient understood, was satisfied, and agreed to proceed.     HPI: Sheryl [name] is a 63 y.o. female who presents for colonoscopy.  Medical history as below.  Tolerated the prep.  No recent chest pain or shortness of breath.  No abdominal pain today.  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   Hyperlipidemia 05/08/2012   Hypertension    Osteopenia 02/27/2017   Overweight(278.02)    Preventative health care 06/07/2011   Sacral pain 05/08/2012   Tachycardia 11/10/2014   Vitamin D deficiency 05/09/2014    Past Surgical History:  Procedure Laterality Date   CESAREAN SECTION  1985 and 1989   X 2   COLONOSCOPY     GUM SURGERY  2007   PARATHYROIDECTOMY Left 05/16/2019   Procedure: LEFT PARATHYROIDECTOMY;  Surgeon: Darnell Level, MD;  Location: [facility];  Service: General;  Laterality: Left;   right hand surgery  2009   infection debrided    Prior to Admission medications   Medication Sig Start Date End Date Taking? Authorizing Provider  atorvastatin (LIPITOR) 10 MG tablet TAKE 1 TABLET (10 MG TOTAL) BY MOUTH DAILY AT 6 PM. 12/08/19  Yes Bradd Canary, MD  metoprolol succinate (TOPROL-XL) 50 MG 24 hr tablet Take 1 tablet (50 mg total) by  mouth daily. Take with or immediately following a meal. 10/09/23  Yes Bradd Canary, MD  pantoprazole (PROTONIX) 40 MG tablet Take 1 tablet (40 mg total) by mouth daily. 10/09/23  Yes Bradd Canary, MD  sertraline (ZOLOFT) 100 MG tablet Take 1.5 tablets (150 mg total) by mouth daily. 10/09/23  Yes Bradd Canary, MD    Current Outpatient Medications  Medication Sig Dispense Refill   atorvastatin (LIPITOR) 10 MG tablet TAKE 1 TABLET (10 MG TOTAL) BY MOUTH DAILY AT 6 PM. 90 tablet 1   metoprolol succinate (TOPROL-XL) 50 MG 24 hr tablet Take 1 tablet (50 mg total) by mouth daily. Take with or immediately following a meal. 90 tablet 1   pantoprazole (PROTONIX) 40 MG tablet Take 1 tablet (40 mg total) by mouth daily. 90 tablet 1   sertraline (ZOLOFT) 100 MG tablet Take 1.5 tablets (150 mg total) by mouth daily. 135 tablet 1   Current Facility-Administered Medications  Medication Dose Route Frequency Provider Last Rate Last Admin   0.9 %  sodium chloride infusion  500 mL Intravenous Once Sybilla Malhotra, Carie Caddy, MD        Allergies as of 10/22/2023 - Review Complete 10/22/2023  Allergen Reaction Noted   Hydrochlorothiazide Other (See Comments) 12/14/2022   Penicillins Rash 06/07/2011    Family History  Problem Relation Age of Onset   Heart  attack Mother    Diabetes Mother        type 2   Hypertension Mother    Hyperlipidemia Mother    Heart disease Mother    Cancer Father        colon   Heart attack Father    Colon cancer Father    Hypertension Sister    Diabetes Sister        type 2   Hyperlipidemia Sister    Heart disease Sister    Hypertension Sister    Arthritis Sister    Diabetes Maternal Grandmother    Hypertension Maternal Grandmother    Hyperlipidemia Maternal Grandmother    Rectal cancer Neg Hx    Stomach cancer Neg Hx    Esophageal cancer Neg Hx     Social History   Socioeconomic History   Marital status: Married    Spouse name: Not on file   Number of children: Not  on file   Years of education: Not on file   Highest education level: Not on file  Occupational History   Not on file  Tobacco Use   Smoking status: Never   Smokeless tobacco: Never  Vaping Use   Vaping status: Never Used  Substance and Sexual Activity   Alcohol use: Yes    Alcohol/week: 35.0 standard drinks of alcohol    Types: 21 Glasses of wine, 14 Standard drinks or equivalent per week    Comment: glass of wine nightly   Drug use: No   Sexual activity: Yes    Partners: Male    Comment: no dietary restrictions, lives with husband  Other Topics Concern   Not on file  Social History Narrative   Not on file   Social Drivers of Health   Financial Resource Strain: Not on file  Food Insecurity: Not on file  Transportation Needs: Not on file  Physical Activity: Not on file  Stress: Not on file  Social Connections: Not on file  Intimate Partner Violence: Not on file    Physical Exam: Vital signs in last 24 hours: @BP  (!) 170/107   Pulse 84   Temp (!) 97.3 F (36.3 C) (Temporal)   Resp (!) 22   Ht 5\' 1"  (1.549 m)   Wt 163 lb (73.9 kg)   LMP 09/08/2011   SpO2 99%   BMI 30.80 kg/m  GEN: NAD EYE: Sclerae anicteric ENT: MMM CV: Non-tachycardic Pulm: CTA b/l GI: Soft, NT/ND NEURO:  Alert & Oriented x 3   Erick Blinks, MD [facility]  10/22/2023 8:51 AM

## 2023-10-22 NOTE — Progress Notes (Signed)
 Report to PACU, RN, vss, BBS= Clear.

## 2023-10-22 NOTE — Op Note (Signed)
 [facility] Patient Name: Sheryl [name] Procedure Date: 10/22/2023 8:32 AM MRN: [id number] Endoscopist: Beverley Fiedler , MD, [id number] Age: 63 Referring MD:  Date of Birth: [birth year] Gender: Female Account #: [id number] Procedure:                Colonoscopy Indications:              High risk colon cancer surveillance: Personal                            history of multiple adenomas, Last colonoscopy:                            2012 (TA x 3) Medicines:                Monitored Anesthesia Care Procedure:                Pre-Anesthesia Assessment:                           - Prior to the procedure, a History and Physical                            was performed, and patient medications and                            allergies were reviewed. The patient's tolerance of                            previous anesthesia was also reviewed. The risks                            and benefits of the procedure and the sedation                            options and risks were discussed with the patient.                            All questions were answered, and informed consent                            was obtained. Prior Anticoagulants: The patient has                            taken no anticoagulant or antiplatelet agents. ASA                            Grade Assessment: II - A patient with mild systemic                            disease. After reviewing the risks and benefits,                            the patient was deemed in satisfactory condition to  undergo the procedure.                           After obtaining informed consent, the colonoscope                            was passed under direct vision. Throughout the                            procedure, the patient's blood pressure, pulse, and                            oxygen saturations were monitored continuously. The                            [id number]-HQ190L Colonoscope [id number] was introduced                             through the anus and advanced to the cecum,                            identified by appendiceal orifice and ileocecal                            valve. The patient tolerated the procedure well.                            The quality of the bowel preparation was good. The                            ileocecal valve, appendiceal orifice, and rectum                            were photographed. The colonoscopy was technically                            difficult and complex due to multiple diverticula                            in the colon and restricted mobility of the colon.                            Successful completion of the procedure was aided by                            withdrawing the scope and replacing with the                            pediatric colonoscope. Scope In: 8:58:06 AM Scope Out: 9:23:48 AM Scope Withdrawal Time: 0 hours 13 minutes 51 seconds  Total Procedure Duration: 0 hours 25 minutes 42 seconds  Findings:                 Skin tags were found on perianal exam.  A 4 mm polyp was found in the ascending colon. The                            polyp was sessile. The polyp was removed with a                            cold snare. Resection and retrieval were complete.                           A 10 mm polyp was found in the ascending colon. The                            polyp was pedunculated. The polyp was removed with                            a hot snare. Resection and retrieval were complete.                           A 7 mm polyp was found in the transverse colon. The                            polyp was sessile. The polyp was removed with a                            cold snare. Resection and retrieval were complete.                           Four sessile polyps were found in the descending                            colon. The polyps were 6 to 12 mm in size. These                            polyps were removed  with a cold snare. Resection                            and retrieval were complete.                           Multiple large-mouthed, medium-mouthed and                            small-mouthed diverticula were found in the                            recto-sigmoid colon and sigmoid colon.                           Internal hemorrhoids were found during                            retroflexion. The hemorrhoids were small. Complications:  No immediate complications. Estimated Blood Loss:     Estimated blood loss was minimal. Impression:               - Perianal skin tags found on perianal exam.                           - One 4 mm polyp in the ascending colon, removed                            with a cold snare. Resected and retrieved.                           - One 10 mm polyp in the ascending colon, removed                            with a hot snare. Resected and retrieved.                           - One 7 mm polyp in the transverse colon, removed                            with a cold snare. Resected and retrieved.                           - Four 6 to 12 mm polyps in the descending colon,                            removed with a cold snare. Resected and retrieved.                           - Severe diverticulosis in the recto-sigmoid colon                            and in the sigmoid colon.                           - Small internal hemorrhoids. Recommendation:           - Patient has a contact number available for                            emergencies. The signs and symptoms of potential                            delayed complications were discussed with the                            patient. Return to normal activities tomorrow.                            Written discharge instructions were provided to the                            patient.                           -  Resume previous diet.                           - Continue present medications.                            - Await pathology results.                           - Repeat colonoscopy is recommended for                            surveillance. The colonoscopy date will be                            determined after pathology results from today's                            exam become available for review. Beverley Fiedler, MD 10/22/2023 9:40:01 AM This report has been signed electronically.

## 2023-10-22 NOTE — Progress Notes (Signed)
 0347 thru sigmoid

## 2023-10-22 NOTE — Patient Instructions (Signed)

## 2023-10-22 NOTE — Progress Notes (Signed)
 Pt's states no medical or surgical changes since previsit or office visit.

## 2023-10-23 ENCOUNTER — Telehealth: Payer: Self-pay | Admitting: *Deleted

## 2023-10-23 NOTE — Telephone Encounter (Signed)
  Follow up Call-     10/22/2023    7:50 AM  Call back number  Post procedure Call Back phone  # [phone]  Permission to leave phone message Yes     Patient questions:  Do you have a fever, pain , or abdominal swelling? No. Pain Score  0 *  Have you tolerated food without any problems? Yes.    Have you been able to return to your normal activities? Yes.    Do you have any questions about your discharge instructions: Diet   No. Medications  No. Follow up visit  No.  Do you have questions or concerns about your Care? No.  Actions: * If pain score is 4 or above: No action needed, pain <4.

## 2023-10-24 LAB — SURGICAL PATHOLOGY

## 2023-10-25 ENCOUNTER — Encounter: Payer: Self-pay | Admitting: Internal Medicine

## 2023-11-26 ENCOUNTER — Other Ambulatory Visit (INDEPENDENT_AMBULATORY_CARE_PROVIDER_SITE_OTHER): Payer: 59

## 2023-11-26 ENCOUNTER — Encounter: Payer: Self-pay | Admitting: Family Medicine

## 2023-11-26 DIAGNOSIS — E782 Mixed hyperlipidemia: Secondary | ICD-10-CM

## 2023-11-26 DIAGNOSIS — F1010 Alcohol abuse, uncomplicated: Secondary | ICD-10-CM | POA: Diagnosis not present

## 2023-11-26 DIAGNOSIS — F32A Depression, unspecified: Secondary | ICD-10-CM

## 2023-11-26 DIAGNOSIS — L989 Disorder of the skin and subcutaneous tissue, unspecified: Secondary | ICD-10-CM | POA: Diagnosis not present

## 2023-11-26 DIAGNOSIS — F419 Anxiety disorder, unspecified: Secondary | ICD-10-CM

## 2023-11-26 DIAGNOSIS — R739 Hyperglycemia, unspecified: Secondary | ICD-10-CM

## 2023-11-26 LAB — CBC WITH DIFFERENTIAL/PLATELET
Basophils Absolute: 0 10*3/uL (ref 0.0–0.1)
Basophils Relative: 0.7 % (ref 0.0–3.0)
Eosinophils Absolute: 0.1 10*3/uL (ref 0.0–0.7)
Eosinophils Relative: 2.7 % (ref 0.0–5.0)
HCT: 44.6 % (ref 36.0–46.0)
Hemoglobin: 14.9 g/dL (ref 12.0–15.0)
Lymphocytes Relative: 24.7 % (ref 12.0–46.0)
Lymphs Abs: 1.3 10*3/uL (ref 0.7–4.0)
MCHC: 33.5 g/dL (ref 30.0–36.0)
MCV: 93 fl (ref 78.0–100.0)
Monocytes Absolute: 0.3 10*3/uL (ref 0.1–1.0)
Monocytes Relative: 6.7 % (ref 3.0–12.0)
Neutro Abs: 3.3 10*3/uL (ref 1.4–7.7)
Neutrophils Relative %: 65.2 % (ref 43.0–77.0)
Platelets: 195 10*3/uL (ref 150.0–400.0)
RBC: 4.8 Mil/uL (ref 3.87–5.11)
RDW: 14 % (ref 11.5–15.5)
WBC: 5.1 10*3/uL (ref 4.0–10.5)

## 2023-11-26 LAB — COMPREHENSIVE METABOLIC PANEL WITH GFR
ALT: 13 U/L (ref 0–35)
AST: 19 U/L (ref 0–37)
Albumin: 4.3 g/dL (ref 3.5–5.2)
Alkaline Phosphatase: 65 U/L (ref 39–117)
BUN: 19 mg/dL (ref 6–23)
CO2: 25 meq/L (ref 19–32)
Calcium: 9.3 mg/dL (ref 8.4–10.5)
Chloride: 103 meq/L (ref 96–112)
Creatinine, Ser: 0.87 mg/dL (ref 0.40–1.20)
GFR: 72.32 mL/min (ref >60.00)
Glucose, Bld: 110 mg/dL — ABNORMAL HIGH (ref 70–99)
Potassium: 4.9 meq/L (ref 3.5–5.1)
Sodium: 137 meq/L (ref 135–145)
Total Bilirubin: 0.8 mg/dL (ref 0.2–1.2)
Total Protein: 6.7 g/dL (ref 6.0–8.3)

## 2023-11-26 LAB — LIPID PANEL
Cholesterol: 220 mg/dL — ABNORMAL HIGH (ref 0–200)
HDL: 109.6 mg/dL (ref >39.00)
LDL Cholesterol: 95 mg/dL (ref 0–99)
NonHDL: 110.58
Total CHOL/HDL Ratio: 2
Triglycerides: 79 mg/dL (ref 0.0–149.0)
VLDL: 15.8 mg/dL (ref 0.0–40.0)

## 2023-11-26 LAB — FOLATE: Folate: 5.2 ng/mL — ABNORMAL LOW (ref >5.9)

## 2023-11-30 ENCOUNTER — Telehealth: Payer: Self-pay | Admitting: Family Medicine

## 2023-11-30 NOTE — Telephone Encounter (Signed)
 Adel Holt left a message with the pt again today informing her we need her to come in for a redraw for [facility].

## 2024-01-01 ENCOUNTER — Encounter: Payer: Self-pay | Admitting: Dermatology

## 2024-01-01 ENCOUNTER — Ambulatory Visit: Admitting: Dermatology

## 2024-01-01 VITALS — BP 144/80 | HR 105

## 2024-01-01 DIAGNOSIS — L578 Other skin changes due to chronic exposure to nonionizing radiation: Secondary | ICD-10-CM

## 2024-01-01 DIAGNOSIS — B078 Other viral warts: Secondary | ICD-10-CM

## 2024-01-01 DIAGNOSIS — W908XXA Exposure to other nonionizing radiation, initial encounter: Secondary | ICD-10-CM

## 2024-01-01 DIAGNOSIS — L821 Other seborrheic keratosis: Secondary | ICD-10-CM

## 2024-01-01 DIAGNOSIS — D1801 Hemangioma of skin and subcutaneous tissue: Secondary | ICD-10-CM

## 2024-01-01 DIAGNOSIS — Z1283 Encounter for screening for malignant neoplasm of skin: Secondary | ICD-10-CM | POA: Diagnosis not present

## 2024-01-01 DIAGNOSIS — D2272 Melanocytic nevi of left lower limb, including hip: Secondary | ICD-10-CM

## 2024-01-01 DIAGNOSIS — D485 Neoplasm of uncertain behavior of skin: Secondary | ICD-10-CM

## 2024-01-01 DIAGNOSIS — L814 Other melanin hyperpigmentation: Secondary | ICD-10-CM | POA: Diagnosis not present

## 2024-01-01 DIAGNOSIS — D492 Neoplasm of unspecified behavior of bone, soft tissue, and skin: Secondary | ICD-10-CM | POA: Diagnosis not present

## 2024-01-01 NOTE — Progress Notes (Signed)
 New Patient Visit   Subjective  Sheryl [name] is a 63 y.o. female who presents for the following: Skin Cancer Screening and Full Body Skin Exam. No hx or family hx of skin cancer.  The patient presents for Total-Body Skin Exam (TBSE) for skin cancer screening and mole check. The patient has spots, moles and lesions to be evaluated, some may be new or changing.  The following portions of the chart were reviewed this encounter and updated as appropriate: medications, allergies, medical history  Review of Systems:  No other skin or systemic complaints except as noted in HPI or Assessment and Plan.  Objective  Well appearing patient in no apparent distress; mood and affect are within normal limits.  A full examination was performed including scalp, head, eyes, ears, nose, lips, neck, chest, axillae, abdomen, back, buttocks, bilateral upper extremities, bilateral lower extremities, hands, feet, fingers, toes, fingernails, and toenails. All findings within normal limits unless otherwise noted below.   Relevant physical exam findings are noted in the Assessment and Plan.  Left Abdomen (side) - Lower 5mm pink wart like papule   Left Medial Plantar Surface 8 mm irregularly pigmented brown macule   Assessment & Plan   SKIN CANCER SCREENING PERFORMED TODAY.  ACTINIC DAMAGE - Chronic condition, secondary to cumulative UV/sun exposure - diffuse scaly erythematous macules with underlying dyspigmentation - Recommend daily broad spectrum sunscreen SPF 30+ to sun-exposed areas, reapply every 2 hours as needed.  - Staying in the shade or wearing long sleeves, sun glasses (UVA+UVB protection) and wide brim hats (4-inch brim around the entire circumference of the hat) are also recommended for sun protection.  - Call for new or changing lesions.  LENTIGINES, SEBORRHEIC KERATOSES, HEMANGIOMAS - Benign normal skin lesions - Benign-appearing - Call for any changes  MELANOCYTIC NEVI - Tan-brown  and/or pink-flesh-colored symmetric macules and papules - Benign appearing on exam today - Observation - Call clinic for new or changing moles - Recommend daily use of broad spectrum spf 30+ sunscreen to sun-exposed areas.   NEOPLASM OF UNCERTAIN BEHAVIOR OF SKIN (2) Left Abdomen (side) - Lower Skin / nail biopsy Type of biopsy: tangential   Informed consent: discussed and consent obtained   Timeout: patient name, date of birth, surgical site, and procedure verified   Procedure prep:  Patient was prepped and draped in usual sterile fashion Prep type:  Isopropyl alcohol Anesthesia: the lesion was anesthetized in a standard fashion   Anesthetic:  1% lidocaine  w/ epinephrine 1-100,000 buffered w/ 8.4% NaHCO3 Instrument used: DermaBlade   Hemostasis achieved with: aluminum chloride   Outcome: patient tolerated procedure well   Post-procedure details: sterile dressing applied and wound care instructions given   Dressing type: petrolatum and bandage   Specimen 1 - Surgical pathology Differential Diagnosis: r/o wart vs isk vs other  Check Margins: No Left Medial Plantar Surface Skin / nail biopsy Type of biopsy: tangential   Informed consent: discussed and consent obtained   Timeout: patient name, date of birth, surgical site, and procedure verified   Procedure prep:  Patient was prepped and draped in usual sterile fashion Prep type:  Isopropyl alcohol Anesthesia: the lesion was anesthetized in a standard fashion   Anesthetic:  1% lidocaine  w/ epinephrine 1-100,000 buffered w/ 8.4% NaHCO3 Instrument used: DermaBlade   Hemostasis achieved with: aluminum chloride   Outcome: patient tolerated procedure well   Post-procedure details: sterile dressing applied and wound care instructions given   Dressing type: petrolatum and bandage  Specimen 2 - Surgical pathology Differential Diagnosis: r/o MM vs BN vs other  Check Margins: No ACTINIC SKIN DAMAGE   LENTIGINES   CHERRY  ANGIOMA   SEBORRHEIC KERATOSES    Return in about 1 year (around 12/31/2024) for TBSC.  I, Haig Levan, Surg Tech III, am acting as scribe for Deneise Finlay, MD.   Documentation: I have reviewed the above documentation for accuracy and completeness, and I agree with the above.  Deneise Finlay, MD

## 2024-01-01 NOTE — Patient Instructions (Addendum)
 Patient Handout: Wound Care for Skin Biopsy Site  Taking Care of Your Skin Biopsy Site  Proper care of the biopsy site is essential for promoting healing and minimizing scarring. This handout provides instructions on how to care for your biopsy site to ensure optimal recovery.  1. Cleaning the Wound:  Clean the biopsy site daily with gentle soap and water. Gently pat the area dry with a clean, soft towel. Avoid harsh scrubbing or rubbing the area, as this can irritate the skin and delay healing.  2. Applying Aquaphor and Bandage:  After cleaning the wound, apply a thin layer of Aquaphor ointment to the biopsy site. Cover the area with a sterile bandage to protect it from dirt, bacteria, and friction. Change the bandage daily or as needed if it becomes soiled or wet.  3. Continued Care for One Week:  Repeat the cleaning, Aquaphor application, and bandaging process daily for one week following the biopsy procedure. Keeping the wound clean and moist during this initial healing period will help prevent infection and promote optimal healing.  4. Massaging Aquaphor into the Area:  ---After one week, discontinue the use of bandages but continue to apply Aquaphor to the biopsy site. ----Gently massage the Aquaphor into the area using circular motions. ---Massaging the skin helps to promote circulation and prevent the formation of scar tissue.   Additional Tips:  Avoid exposing the biopsy site to direct sunlight during the healing process, as this can cause hyperpigmentation or worsen scarring. If you experience any signs of infection, such as increased redness, swelling, warmth, or drainage from the wound, contact your healthcare provider immediately. Follow any additional instructions provided by your healthcare provider for caring for the biopsy site and managing any discomfort. Conclusion:  Taking proper care of your skin biopsy site is crucial for ensuring optimal healing and  minimizing scarring. By following these instructions for cleaning, applying Aquaphor, and massaging the area, you can promote a smooth and successful recovery. If you have any questions or concerns about caring for your biopsy site, don't hesitate to contact your healthcare provider for guidance.   Important Information  Due to recent changes in healthcare laws, you may see results of your pathology and/or laboratory studies on MyChart before the doctors have had a chance to review them. We understand that in some cases there may be results that are confusing or concerning to you. Please understand that not all results are received at the same time and often the doctors may need to interpret multiple results in order to provide you with the best plan of care or course of treatment. Therefore, we ask that you please give [place] 2 business days to thoroughly review all your results before contacting the office for clarification. Should we see a critical lab result, you will be contacted sooner.   If You Need Anything After Your Visit  If you have any questions or concerns for your doctor, please call our main line at [phone] If no one answers, please leave a voicemail as directed and we will return your call as soon as possible. Messages left after 4 pm will be answered the following business day.   You may also send [place] a message via MyChart. We typically respond to MyChart messages within 1-2 business days.  For prescription refills, please ask your pharmacy to contact our office. Our fax number is [phone].  If you have an urgent issue when the clinic is closed that cannot wait until the next business day,  you can page your doctor at the number below.    Please note that while we do our best to be available for urgent issues outside of office hours, we are not available 24/7.   If you have an urgent issue and are unable to reach [place], you may choose to seek medical care at your doctor's office,  retail clinic, urgent care center, or emergency room.  If you have a medical emergency, please immediately call 911 or go to the emergency department. In the event of inclement weather, please call our main line at [phone] for an update on the status of any delays or closures.  Dermatology Medication Tips: Please keep the boxes that topical medications come in in order to help keep track of the instructions about where and how to use these. Pharmacies typically print the medication instructions only on the boxes and not directly on the medication tubes.   If your medication is too expensive, please contact our office at [phone] or send [place] a message through MyChart.   We are unable to tell what your co-pay for medications will be in advance as this is different depending on your insurance coverage. However, we may be able to find a substitute medication at lower cost or fill out paperwork to get insurance to cover a needed medication.   If a prior authorization is required to get your medication covered by your insurance company, please allow [place] 1-2 business days to complete this process.  Drug prices often vary depending on where the prescription is filled and some pharmacies may offer cheaper prices.  The website www.goodrx.com contains coupons for medications through different pharmacies. The prices here do not account for what the cost may be with help from insurance (it may be cheaper with your insurance), but the website can give you the price if you did not use any insurance.  - You can print the associated coupon and take it with your prescription to the pharmacy.  - You may also stop by our office during regular business hours and pick up a GoodRx coupon card.  - If you need your prescription sent electronically to a different pharmacy, notify our office through [facility] or by phone at [phone]    Skin Education :   I counseled the patient regarding the  following: Sun screen (SPF 30 or greater) should be applied during peak UV exposure (between 10am and 2pm) and reapplied after exercise or swimming.  The ABCDEs of melanoma were reviewed with the patient, and the importance of monthly self-examination of moles was emphasized. Should any moles change in shape or color, or itch, bleed or burn, pt will contact our office for evaluation sooner then their interval appointment.  Plan: Sunscreen Recommendations I recommended a broad spectrum sunscreen with a SPF of 30 or higher. I explained that SPF 30 sunscreens block approximately 97 percent of the sun's harmful rays. Sunscreens should be applied at least 15 minutes prior to expected sun exposure and then every 2 hours after that as long as sun exposure continues. If swimming or exercising sunscreen should be reapplied every 45 minutes to an hour after getting wet or sweating. One ounce, or the equivalent of a shot glass full of sunscreen, is adequate to protect the skin not covered by a bathing suit. I also recommended a lip balm with a sunscreen as well. Sun protective clothing can be used in lieu of sunscreen but must be worn the entire time you are exposed  to the sun's rays.

## 2024-01-04 LAB — SURGICAL PATHOLOGY

## 2024-01-06 ENCOUNTER — Ambulatory Visit: Payer: Self-pay | Admitting: Dermatology

## 2024-01-07 NOTE — Telephone Encounter (Signed)
-----   Message from [facility] PACI sent at 01/06/2024  6:01 PM EDT ----- Please call patient to discuss results showing wart on left abdomen, we can schedule for cryo and DN moderate to severe on left plantar surface (foot) and get her scheduled for excision with me.

## 2024-01-07 NOTE — Telephone Encounter (Signed)
Results and treatment discussed with patient.

## 2024-01-21 ENCOUNTER — Ambulatory Visit: Payer: 59 | Admitting: Family Medicine

## 2024-02-20 ENCOUNTER — Encounter: Payer: Self-pay | Admitting: Dermatology

## 2024-02-21 ENCOUNTER — Telehealth: Payer: Self-pay | Admitting: Dermatology

## 2024-02-21 NOTE — Telephone Encounter (Signed)
 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  I went over this was the standard treatment for mole like this.  Patient asked if she could have some time to research it and decide what she wanted to do.  I told her we were happy to have her come in and talk to Dr. Corey about the biopsy results and treatment.  She said she would call back once she had time to look up more information.

## 2024-02-27 ENCOUNTER — Ambulatory Visit: Admitting: Dermatology

## 2024-05-02 ENCOUNTER — Other Ambulatory Visit: Payer: Self-pay | Admitting: Family Medicine

## 2024-05-02 ENCOUNTER — Encounter: Payer: Self-pay | Admitting: *Deleted

## 2024-05-22 ENCOUNTER — Encounter: Payer: Self-pay | Admitting: Student

## 2024-05-22 ENCOUNTER — Ambulatory Visit: Admitting: Student

## 2024-05-22 VITALS — BP 177/120 | HR 85 | Temp 98.2°F | Ht 61.0 in | Wt 166.8 lb

## 2024-05-22 DIAGNOSIS — E782 Mixed hyperlipidemia: Secondary | ICD-10-CM | POA: Diagnosis not present

## 2024-05-22 DIAGNOSIS — F419 Anxiety disorder, unspecified: Secondary | ICD-10-CM | POA: Diagnosis not present

## 2024-05-22 DIAGNOSIS — R739 Hyperglycemia, unspecified: Secondary | ICD-10-CM

## 2024-05-22 DIAGNOSIS — E213 Hyperparathyroidism, unspecified: Secondary | ICD-10-CM | POA: Diagnosis not present

## 2024-05-22 DIAGNOSIS — I10 Essential (primary) hypertension: Secondary | ICD-10-CM

## 2024-05-22 DIAGNOSIS — Z23 Encounter for immunization: Secondary | ICD-10-CM | POA: Diagnosis not present

## 2024-05-22 DIAGNOSIS — F32A Depression, unspecified: Secondary | ICD-10-CM

## 2024-05-22 DIAGNOSIS — E559 Vitamin D deficiency, unspecified: Secondary | ICD-10-CM

## 2024-05-22 DIAGNOSIS — F1010 Alcohol abuse, uncomplicated: Secondary | ICD-10-CM | POA: Diagnosis not present

## 2024-05-22 DIAGNOSIS — K219 Gastro-esophageal reflux disease without esophagitis: Secondary | ICD-10-CM

## 2024-05-22 DIAGNOSIS — E669 Obesity, unspecified: Secondary | ICD-10-CM

## 2024-05-22 MED ORDER — WEGOVY 0.25 MG/0.5ML ~~LOC~~ SOAJ: 0.2500 mg | SUBCUTANEOUS | 1 refills | Status: DC

## 2024-05-22 NOTE — Progress Notes (Signed)
 Subjective:     Patient ID: Sheryl [name], female    DOB: [birth year], 63 y.o.   MRN: [id number]  No chief complaint on file.   HPI  Discussed the use of AI scribe software for clinical note transcription with the patient, who gave verbal consent to proceed.  History of Present Illness [name] is a 63 year old female who presents for follow up   She manages her hypertension with metoprolol  taken at night but has not been regularly monitoring her blood pressure at home. She is not taking Lipitor due to concerns about side effects. Her current medications include Protonix  for GERD and Zoloft  150 mg daily for mood management.  She is exploring weight loss options and was previously prescribed Saxenda  but did not start it due to the daily injection requirement. She is attempting weight loss through diet and exercise, using a treadmill for two miles daily and end trying to eat healthier but not bene able to lose weight effectively.   Follows with Dermatology  Anxiety and depression Zoloft  150 mg daily  HTN-metoprolol  50 mg daily BP at home: does not take BP at home  HLD-Lipitor 10 mg daily Not taking medication, personal preference.  GERD-Protonix  40 mg daily   Taking medications as prescribed.  Patient denies fever, chills, SOB, CP, palpitations, dyspnea, edema, HA, vision changes, N/V/D, abdominal pain, urinary symptoms, rash, weight changes, and recent illness or hospitalizations.    History of Present Illness              Health Maintenance Due  Topic Date Due   Pneumococcal Vaccine: 50+ Years (1 of 1 - PCV) Never done   Cervical Cancer Screening (HPV/Pap Cotest)  09/03/2021   Influenza Vaccine  02/29/2024    Past Medical History:  Diagnosis Date   Anemia    Anxiety    Anxiety and depression    Follows with Dr Hoy Lee q 6 months Has taken Zoloft  in past without good results    Chicken pox as a child   Depression    Elevated BP  02/06/2012   FH: colon cancer 06/07/2011   GERD (gastroesophageal reflux disease)    High serum parathyroid  hormone (PTH) 09/15/2016   Hyperlipidemia 05/08/2012   Hypertension    Osteopenia 02/27/2017   Overweight(278.02)    Preventative health care 06/07/2011   Sacral pain 05/08/2012   Tachycardia 11/10/2014   Vitamin D  deficiency 05/09/2014    Past Surgical History:  Procedure Laterality Date   CESAREAN SECTION  1985 and 1989   X 2   COLONOSCOPY     GUM SURGERY  2007   PARATHYROIDECTOMY Left 05/16/2019   Procedure: LEFT PARATHYROIDECTOMY;  Surgeon: Eletha Boas, MD;  Location: [facility];  Service: General;  Laterality: Left;   right hand surgery  2009   infection debrided    Family History  Problem Relation Age of Onset   Heart attack Mother    Diabetes Mother        type 2   Hypertension Mother    Hyperlipidemia Mother    Heart disease Mother    Cancer Father        colon   Heart attack Father    Colon cancer Father    Hypertension Sister    Diabetes Sister        type 2   Hyperlipidemia Sister    Heart disease Sister    Hypertension Sister    Arthritis Sister  Diabetes Maternal Grandmother    Hypertension Maternal Grandmother    Hyperlipidemia Maternal Grandmother    Rectal cancer Neg Hx    Stomach cancer Neg Hx    Esophageal cancer Neg Hx     Social History   Socioeconomic History   Marital status: Married    Spouse name: Not on file   Number of children: Not on file   Years of education: Not on file   Highest education level: Not on file  Occupational History   Not on file  Tobacco Use   Smoking status: Never   Smokeless tobacco: Never  Vaping Use   Vaping status: Never Used  Substance and Sexual Activity   Alcohol use: Yes    Alcohol/week: 35.0 standard drinks of alcohol    Types: 21 Glasses of wine, 14 Standard drinks or equivalent per week    Comment: glass of wine nightly   Drug use: No   Sexual activity: Yes    Partners: Male    Comment: no  dietary restrictions, lives with husband  Other Topics Concern   Not on file  Social History Narrative   Not on file   Social Drivers of Health   Financial Resource Strain: Not on file  Food Insecurity: Not on file  Transportation Needs: Not on file  Physical Activity: Not on file  Stress: Not on file  Social Connections: Not on file  Intimate Partner Violence: Not on file    Outpatient Medications Prior to Visit  Medication Sig Dispense Refill   metoprolol  succinate (TOPROL -XL) 50 MG 24 hr tablet TAKE 1 TABLET BY MOUTH DAILY. TAKE WITH OR IMMEDIATELY FOLLOWING A MEAL. 90 tablet 0   pantoprazole  (PROTONIX ) 40 MG tablet Take 1 tablet (40 mg total) by mouth daily. 90 tablet 1   sertraline  (ZOLOFT ) 100 MG tablet TAKE 1.5 TABLETS (150MG  TOTAL) BY MOUTH DAILY 135 tablet 0   atorvastatin  (LIPITOR) 10 MG tablet TAKE 1 TABLET (10 MG TOTAL) BY MOUTH DAILY AT 6 PM. 90 tablet 1   No facility-administered medications prior to visit.    Allergies  Allergen Reactions   Hydrochlorothiazide  Other (See Comments)    Had weird dreams and made her feel bad.   Penicillins Rash    Did it involve swelling of the face/tongue/throat, SOB, or low BP? No Did it involve sudden or severe rash/hives, skin peeling, or any reaction on the inside of your mouth or nose? No Did you need to seek medical attention at a hospital or doctor's office? No When did it last happen?      childhood allergy If all above answers are "NO", may proceed with cephalosporin use.     ROS    See HPI Objective:    Physical Exam General: No acute distress. Awake and conversant.  Eyes: Normal conjunctiva, anicteric. Round symmetric pupils.  Respiratory: CTAB. Respirations are non-labored. No wheezing.  Skin: Warm. No rashes or ulcers.  Psych: Alert and oriented. Cooperative, Appropriate mood and affect, Normal judgment.  CV: RRR. No murmur. No lower extremity edema.  MSK: Normal ambulation. No clubbing or cyanosis.   Neuro:  CN II-XII grossly normal.    BP (!) 160/107   Pulse (!) 117   Temp 98.2 F (36.8 C)   Ht 5' 1 (1.549 m)   Wt 166 lb 12.8 oz (75.7 kg)   LMP 09/08/2011   SpO2 98%   BMI 31.52 kg/m  Wt Readings from Last 3 Encounters:  05/22/24 166 lb 12.8 oz (75.7  kg)  10/22/23 163 lb (73.9 kg)  10/09/23 163 lb (73.9 kg)       Assessment & Plan:   Problem List Items Addressed This Visit     Anxiety and depression   Stable on current medications      Relevant Orders   TSH   Excessive drinking of alcohol   Doing better with this, will rechekc b1 and folate      Relevant Orders   CBC with Differential/Platelet   Vitamin B1   Folate   GERD (gastroesophageal reflux disease)   Avoid offending foods, start probiotics. Do not eat large meals in late evening and consider raising head of bed. Has occasional breakthrough on Pantoprazole  can use Pepcid 20 mg prn       Hyperglycemia - Primary   Relevant Orders   Comprehensive metabolic panel with GFR   Hyperlipidemia   Stopped taking statin medication, patient preference. Encourage heart healthy diet such as MIND or DASH diet, increase exercise, avoid trans fats, simple carbohydrates and processed foods, consider a krill or fish or flaxseed oil cap daily.        Relevant Orders   Lipid panel   Hyperparathyroidism   Relevant Orders   CBC with Differential/Platelet   Obesity (BMI 30-39.9)   - Prescribe Wegovy  0.25 mg weekly for four weeks, then 0.5 mg weekly. - Schedule follow-up in two months to assess progress and adjust treatment. -Encourage healthy diet and increase physical activity.       Relevant Medications   semaglutide -weight management (WEGOVY ) 0.25 MG/0.5ML SOAJ SQ injection   Primary hypertension   Blood pressure 160/90 today during OV Possible white coat hypertension. - Monitor home blood pressure monitoring and report via MyChart. - Consider medication adjustment if home readings exceed 140/90.       Vitamin D  insufficiency   Other Visit Diagnoses       Need for influenza vaccination            I have discontinued Shatarra K. Uriegas Kim's atorvastatin . I am also having her start on Wegovy . Additionally, I am having her maintain her pantoprazole , sertraline , and metoprolol  succinate.  Meds ordered this encounter  Medications   semaglutide -weight management (WEGOVY ) 0.25 MG/0.5ML SOAJ SQ injection    Sig: Inject 0.25 mg into the skin once a week. After 4 weeks, can increase to 0.5 mg weekly for 4 weeks, then 1 mg weekly for 4 weeks    Dispense:  2 mL    Refill:  1    Supervising Provider:   DOMENICA BLACKBIRD A [[id number]]

## 2024-05-22 NOTE — Addendum Note (Signed)
 Addended by: KANDIS JEOFFREY CROME on: 05/22/2024 04:30 PM   Modules accepted: Orders

## 2024-05-22 NOTE — Assessment & Plan Note (Signed)
 Stopped taking statin medication, patient preference. Encourage heart healthy diet such as MIND or DASH diet, increase exercise, avoid trans fats, simple carbohydrates and processed foods, consider a krill or fish or flaxseed oil cap daily.

## 2024-05-22 NOTE — Assessment & Plan Note (Signed)
 Doing better with this, will rechekc b1 and folate

## 2024-05-22 NOTE — Assessment & Plan Note (Addendum)
-   Prescribe Wegovy  0.25 mg weekly for four weeks, then 0.5 mg weekly. - Schedule follow-up in two months to assess progress and adjust treatment. -Encourage healthy diet and increase physical activity.

## 2024-05-22 NOTE — Assessment & Plan Note (Signed)
Avoid offending foods, start probiotics. Do not eat large meals in late evening and consider raising head of bed. Has occasional breakthrough on Pantoprazole can use Pepcid 20 mg prn

## 2024-05-22 NOTE — Assessment & Plan Note (Signed)
 Blood pressure 160/90 today during OV Possible white coat hypertension. - Monitor home blood pressure monitoring and report via MyChart. - Consider medication adjustment if home readings exceed 140/90.

## 2024-05-22 NOTE — Assessment & Plan Note (Signed)
 Stable on current medications

## 2024-05-23 ENCOUNTER — Ambulatory Visit: Payer: Self-pay | Admitting: Student

## 2024-05-23 DIAGNOSIS — E519 Thiamine deficiency, unspecified: Secondary | ICD-10-CM

## 2024-05-23 LAB — COMPREHENSIVE METABOLIC PANEL WITH GFR
ALT: 15 U/L (ref 0–35)
AST: 22 U/L (ref 0–37)
Albumin: 4.6 g/dL (ref 3.5–5.2)
Alkaline Phosphatase: 71 U/L (ref 39–117)
BUN: 22 mg/dL (ref 6–23)
CO2: 25 meq/L (ref 19–32)
Calcium: 9.9 mg/dL (ref 8.4–10.5)
Chloride: 104 meq/L (ref 96–112)
Creatinine, Ser: 0.97 mg/dL (ref 0.40–1.20)
GFR: 63.25 mL/min (ref >60.00)
Glucose, Bld: 107 mg/dL — ABNORMAL HIGH (ref 70–99)
Potassium: 5 meq/L (ref 3.5–5.1)
Sodium: 140 meq/L (ref 135–145)
Total Bilirubin: 0.6 mg/dL (ref 0.2–1.2)
Total Protein: 7.3 g/dL (ref 6.0–8.3)

## 2024-05-23 LAB — CBC WITH DIFFERENTIAL/PLATELET
Basophils Absolute: 0.1 K/uL (ref 0.0–0.1)
Basophils Relative: 1.1 % (ref 0.0–3.0)
Eosinophils Absolute: 0.1 K/uL (ref 0.0–0.7)
Eosinophils Relative: 2.3 % (ref 0.0–5.0)
HCT: 45.4 % (ref 36.0–46.0)
Hemoglobin: 15.3 g/dL — ABNORMAL HIGH (ref 12.0–15.0)
Lymphocytes Relative: 28.3 % (ref 12.0–46.0)
Lymphs Abs: 1.5 K/uL (ref 0.7–4.0)
MCHC: 33.7 g/dL (ref 30.0–36.0)
MCV: 91.5 fl (ref 78.0–100.0)
Monocytes Absolute: 0.4 K/uL (ref 0.1–1.0)
Monocytes Relative: 6.6 % (ref 3.0–12.0)
Neutro Abs: 3.3 K/uL (ref 1.4–7.7)
Neutrophils Relative %: 61.7 % (ref 43.0–77.0)
Platelets: 206 K/uL (ref 150.0–400.0)
RBC: 4.96 Mil/uL (ref 3.87–5.11)
RDW: 13.3 % (ref 11.5–15.5)
WBC: 5.4 K/uL (ref 4.0–10.5)

## 2024-05-23 LAB — LIPID PANEL
Cholesterol: 234 mg/dL — ABNORMAL HIGH (ref 0–200)
HDL: 104.5 mg/dL (ref >39.00)
LDL Cholesterol: 103 mg/dL — ABNORMAL HIGH (ref 0–99)
NonHDL: 129.98
Total CHOL/HDL Ratio: 2
Triglycerides: 134 mg/dL (ref 0.0–149.0)
VLDL: 26.8 mg/dL (ref 0.0–40.0)

## 2024-05-23 LAB — FOLATE: Folate: 5.3 ng/mL — ABNORMAL LOW (ref >5.9)

## 2024-05-23 LAB — TSH: TSH: 4.74 u[IU]/mL (ref 0.35–5.50)

## 2024-05-28 LAB — VITAMIN B1: Vitamin B1 (Thiamine): <6 nmol/L — ABNORMAL LOW (ref 8–30)

## 2024-05-28 MED ORDER — VITAMIN B-1 50 MG PO TABS: 50.0000 mg | ORAL_TABLET | Freq: Every day | ORAL | 0 refills | Status: AC

## 2024-06-13 ENCOUNTER — Encounter: Payer: Self-pay | Admitting: Student

## 2024-06-16 ENCOUNTER — Other Ambulatory Visit (HOSPITAL_COMMUNITY): Payer: Self-pay

## 2024-06-16 ENCOUNTER — Telehealth: Payer: Self-pay

## 2024-06-16 NOTE — Telephone Encounter (Signed)
 Pharmacy Patient Advocate Encounter   Received notification from Patient Advice Request messages that prior authorization for Wegovy  0.25mg /0.39ml is required/requested.   Insurance verification completed.   The patient is insured through [facility].   Per test claim: PA required; PA submitted to above mentioned insurance via Latent Key/confirmation #/EOC [id number] Status is pending

## 2024-06-18 NOTE — Telephone Encounter (Signed)
 Pharmacy Patient Advocate Encounter  Received notification from [facility] that Prior Authorization for Wegovy  0.25mg /0.62ml has been APPROVED from 06/18/24 to 01/14/25   PA #/Case ID/Reference #: [id number]

## 2024-06-18 NOTE — Telephone Encounter (Signed)
 Resubmitted the PA for Wegovy .   New Key: [id number]

## 2024-07-21 NOTE — Progress Notes (Deleted)
 "  Subjective:     Patient ID: Sheryl [name], [name]    DOB: 1961-01-30, 63 y.o.   MRN: [id number]  No chief complaint on file.  PAP??  *if BP elevated-- add lisinopril Drinking?? ETOH use?  Thiamine- B1 has been low-- taking 100 mg tabs B1? Folic acid ?   HPI  Discussed the use of AI scribe software for clinical note transcription with the patient, who gave verbal consent to proceed.  History of Present Illness Sheryl [name] is a 63 year old [name] who presents for follow up   Follows with Dermatology  Anxiety and depression Zoloft  150 mg daily  HTN-metoprolol  50 mg daily BP at home: does not take BP at home  HLD-Lipitor 10 mg daily Not taking medication, personal preference.  GERD-Protonix  40 mg daily  Elevated BMI------------- how doing On Wegovy  0.5 mg injection weekly  Wt Readings from Last 3 Encounters:  05/22/24 166 lb 12.8 oz (75.7 kg)  10/22/23 163 lb (73.9 kg)  10/09/23 163 lb (73.9 kg)     Taking medications as prescribed, denies adverse SEs  Patient denies fever, chills, SOB, CP, palpitations, dyspnea, edema, HA, vision changes, N/V/D, abdominal pain, urinary symptoms, rash, weight changes, and recent illness or hospitalizations.    History of Present Illness              Health Maintenance Due  Topic Date Due   Pneumococcal Vaccine: 50+ Years (1 of 1 - PCV) Never done   Cervical Cancer Screening (HPV/Pap Cotest)  09/03/2021    Past Medical History:  Diagnosis Date   Anemia    Anxiety    Anxiety and depression    Follows with Dr Hoy Lee q 6 months Has taken Zoloft  in past without good results    Chicken pox as a child   Depression    Elevated BP 02/06/2012   FH: colon cancer 06/07/2011   GERD (gastroesophageal reflux disease)    High serum parathyroid  hormone (PTH) 09/15/2016   Hyperlipidemia 05/08/2012   Hypertension    Osteopenia 02/27/2017   Overweight(278.02)    Preventative health care 06/07/2011   Sacral  pain 05/08/2012   Tachycardia 11/10/2014   Vitamin D  deficiency 05/09/2014    Past Surgical History:  Procedure Laterality Date   CESAREAN SECTION  1985 and 1989   X 2   COLONOSCOPY     GUM SURGERY  2007   PARATHYROIDECTOMY Left 05/16/2019   Procedure: LEFT PARATHYROIDECTOMY;  Surgeon: Eletha Boas, MD;  Location: [facility];  Service: General;  Laterality: Left;   right hand surgery  2009   infection debrided    Family History  Problem Relation Age of Onset   Heart attack Mother    Diabetes Mother        type 2   Hypertension Mother    Hyperlipidemia Mother    Heart disease Mother    Cancer Father        colon   Heart attack Father    Colon cancer Father    Hypertension Sister    Diabetes Sister        type 2   Hyperlipidemia Sister    Heart disease Sister    Hypertension Sister    Arthritis Sister    Diabetes Maternal Grandmother    Hypertension Maternal Grandmother    Hyperlipidemia Maternal Grandmother    Rectal cancer Neg Hx    Stomach cancer Neg Hx    Esophageal cancer Neg Hx  Social History   Socioeconomic History   Marital status: Married    Spouse name: Not on file   Number of children: Not on file   Years of education: Not on file   Highest education level: Not on file  Occupational History   Not on file  Tobacco Use   Smoking status: Never   Smokeless tobacco: Never  Vaping Use   Vaping status: Never Used  Substance and Sexual Activity   Alcohol use: Yes    Alcohol/week: 35.0 standard drinks of alcohol    Types: 21 Glasses of wine, 14 Standard drinks or equivalent per week    Comment: glass of wine nightly   Drug use: No   Sexual activity: Yes    Partners: Male    Comment: no dietary restrictions, lives with husband  Other Topics Concern   Not on file  Social History Narrative   Not on file   Social Drivers of Health   Tobacco Use: Low Risk (05/22/2024)   Patient History    Smoking Tobacco Use: Never    Smokeless Tobacco Use: Never     Passive Exposure: Not on file  Financial Resource Strain: Not on file  Food Insecurity: Not on file  Transportation Needs: Not on file  Physical Activity: Not on file  Stress: Not on file  Social Connections: Not on file  Intimate Partner Violence: Not on file  Depression (PHQ2-9): Low Risk (05/22/2024)   Depression (PHQ2-9)    PHQ-2 Score: 0  Alcohol Screen: Not on file  Housing: Not on file  Utilities: Not on file  Health Literacy: Not on file    Outpatient Medications Prior to Visit  Medication Sig Dispense Refill   metoprolol  succinate (TOPROL -XL) 50 MG 24 hr tablet TAKE 1 TABLET BY MOUTH DAILY. TAKE WITH OR IMMEDIATELY FOLLOWING A MEAL. 90 tablet 0   pantoprazole  (PROTONIX ) 40 MG tablet Take 1 tablet (40 mg total) by mouth daily. 90 tablet 1   semaglutide -weight management (WEGOVY ) 0.25 MG/0.5ML SOAJ SQ injection Inject 0.25 mg into the skin once a week. After 4 weeks, can increase to 0.5 mg weekly for 4 weeks, then 1 mg weekly for 4 weeks 2 mL 1   sertraline  (ZOLOFT ) 100 MG tablet TAKE 1.5 TABLETS (150MG  TOTAL) BY MOUTH DAILY 135 tablet 0   thiamine (VITAMIN B-1) 50 MG tablet Take 1 tablet (50 mg total) by mouth daily. 90 tablet 0   No facility-administered medications prior to visit.    Allergies  Allergen Reactions   Hydrochlorothiazide  Other (See Comments)    Had weird dreams and made her feel bad.   Penicillins Rash    Did it involve swelling of the face/tongue/throat, SOB, or low BP? No Did it involve sudden or severe rash/hives, skin peeling, or any reaction on the inside of your mouth or nose? No Did you need to seek medical attention at a hospital or doctor's office? No When did it last happen?      childhood allergy If all above answers are NO, may proceed with cephalosporin use.     ROS    See HPI Objective:    Physical Exam Vitals reviewed.  Constitutional:      General: She is not in acute distress.    Appearance: She is not toxic-appearing.   HENT:     Head: Normocephalic and atraumatic.     Mouth/Throat:     Mouth: Mucous membranes are moist.     Pharynx: Oropharynx is clear.  Eyes:  Pupils: Pupils are equal, round, and reactive to light.  Cardiovascular:     Rate and Rhythm: Normal rate and regular rhythm.     Pulses: Normal pulses.     Heart sounds: Normal heart sounds. No murmur heard. Pulmonary:     Effort: Pulmonary effort is normal. No respiratory distress.     Breath sounds: Normal breath sounds. No wheezing.  Musculoskeletal:        General: No swelling.     Cervical back: Neck supple.  Skin:    General: Skin is warm and dry.  Neurological:     General: No focal deficit present.     Mental Status: She is alert and oriented to person, place, and time.  Psychiatric:        Mood and Affect: Mood normal.        Behavior: Behavior normal.        Thought Content: Thought content normal.        Judgment: Judgment normal.      LMP 09/08/2011  Wt Readings from Last 3 Encounters:  05/22/24 166 lb 12.8 oz (75.7 kg)  10/22/23 163 lb (73.9 kg)  10/09/23 163 lb (73.9 kg)       Assessment & Plan:   Problem List Items Addressed This Visit       Cardiovascular and Mediastinum   Primary hypertension - Primary   Well controlled, no changes to meds. Encouraged heart healthy diet such as the DASH diet and exercise as tolerated.          Digestive   GERD (gastroesophageal reflux disease)   Avoid offending foods, start probiotics. Do not eat large meals in late evening and consider raising head of bed. 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        Thiamine deficiency   Supplement and monitor      Vitamin D  insufficiency   Supplement and monitor       ]  I am having Linsey K. Rapley [name] maintain her pantoprazole , sertraline , metoprolol  succinate, Wegovy , and thiamine.  No orders of the defined types were placed in this encounter.  "

## 2024-07-28 NOTE — Assessment & Plan Note (Deleted)
 Encourage heart healthy diet such as MIND or DASH diet, increase exercise, avoid trans fats, simple carbohydrates and processed foods, consider a krill or fish or flaxseed oil cap daily.

## 2024-07-28 NOTE — Assessment & Plan Note (Deleted)
Avoid offending foods, start probiotics. Do not eat large meals in late evening and consider raising head of bed. Has occasional breakthrough on Pantoprazole can use Pepcid 20 mg prn

## 2024-07-28 NOTE — Assessment & Plan Note (Deleted)
 Hydrate and monitor

## 2024-07-28 NOTE — Assessment & Plan Note (Deleted)
"   Stable on current medications.         "

## 2024-07-28 NOTE — Assessment & Plan Note (Deleted)
 Supplement and monitor

## 2024-07-28 NOTE — Assessment & Plan Note (Deleted)
 Well controlled, no changes to meds. Encouraged heart healthy diet such as the DASH diet and exercise as tolerated.

## 2024-07-29 ENCOUNTER — Ambulatory Visit: Admitting: Student

## 2024-07-29 DIAGNOSIS — E559 Vitamin D deficiency, unspecified: Secondary | ICD-10-CM

## 2024-07-29 DIAGNOSIS — F1010 Alcohol abuse, uncomplicated: Secondary | ICD-10-CM

## 2024-07-29 DIAGNOSIS — F419 Anxiety disorder, unspecified: Secondary | ICD-10-CM

## 2024-07-29 DIAGNOSIS — K219 Gastro-esophageal reflux disease without esophagitis: Secondary | ICD-10-CM

## 2024-07-29 DIAGNOSIS — M8580 Other specified disorders of bone density and structure, unspecified site: Secondary | ICD-10-CM

## 2024-07-29 DIAGNOSIS — E213 Hyperparathyroidism, unspecified: Secondary | ICD-10-CM

## 2024-07-29 DIAGNOSIS — I10 Essential (primary) hypertension: Secondary | ICD-10-CM

## 2024-07-29 DIAGNOSIS — E782 Mixed hyperlipidemia: Secondary | ICD-10-CM

## 2024-07-30 NOTE — Progress Notes (Signed)
 "  Subjective:     Patient ID: Sheryl [name], female    DOB: 13-Feb-1961, 63 y.o.   MRN: [id number]  No chief complaint on file.  HPI  Discussed the use of AI scribe software for clinical note transcription with the patient, who gave verbal consent to proceed.  History of Present Illness Sheryl [name] is a 62 year old female who presents for follow up   She notes home blood pressure readings have been above goal. She fell on her tailbone on May 30, 2024 with significant pain and bruising. She was never seen for this or had imaging following the fall. Pain is improving but still very sore.   Follows with Dermatology  Anxiety and depression Zoloft  150 mg daily  HTN-metoprolol  50 mg daily BP at home:140-150s/ 90-100s  HLD-Lipitor 10 mg daily Not taking medication, personal preference.  GERD-Protonix  40 mg daily  Elevated BMI On Wegovy  0.5 mg injection weekly. Denies Adverse Ses.   Wt Readings from Last 3 Encounters:  08/05/24 161 lb (73 kg)  05/22/24 166 lb 12.8 oz (75.7 kg)  10/22/23 163 lb (73.9 kg)     Taking medications as prescribed, denies adverse SEs  Patient denies fever, chills, SOB, CP, palpitations, dyspnea, edema, HA, vision changes, N/V/D, abdominal pain, urinary symptoms, rash, weight changes, and recent illness or hospitalizations.    History of Present Illness              Health Maintenance Due  Topic Date Due   Cervical Cancer Screening (HPV/Pap Cotest)  09/03/2021    Past Medical History:  Diagnosis Date   Anemia    Anxiety    Anxiety and depression    Follows with Dr Hoy Lee q 6 months Has taken Zoloft  in past without good results    Chicken pox as a child   Depression    Elevated BP 02/06/2012   FH: colon cancer 06/07/2011   GERD (gastroesophageal reflux disease)    High serum parathyroid  hormone (PTH) 09/15/2016   Hyperlipidemia 05/08/2012   Hypertension    Osteopenia 02/27/2017   Overweight(278.02)     Preventative health care 06/07/2011   Sacral pain 05/08/2012   Tachycardia 11/10/2014   Vitamin D  deficiency 05/09/2014    Past Surgical History:  Procedure Laterality Date   CESAREAN SECTION  1985 and 1989   X 2   COLONOSCOPY     GUM SURGERY  2007   PARATHYROIDECTOMY Left 05/16/2019   Procedure: LEFT PARATHYROIDECTOMY;  Surgeon: Eletha Boas, MD;  Location: [facility];  Service: General;  Laterality: Left;   right hand surgery  2009   infection debrided    Family History  Problem Relation Age of Onset   Heart attack Mother    Diabetes Mother        type 2   Hypertension Mother    Hyperlipidemia Mother    Heart disease Mother    Cancer Father        colon   Heart attack Father    Colon cancer Father    Hypertension Sister    Diabetes Sister        type 2   Hyperlipidemia Sister    Heart disease Sister    Hypertension Sister    Arthritis Sister    Diabetes Maternal Grandmother    Hypertension Maternal Grandmother    Hyperlipidemia Maternal Grandmother    Rectal cancer Neg Hx    Stomach cancer Neg Hx    Esophageal cancer Neg  Hx     Social History   Socioeconomic History   Marital status: Married    Spouse name: Not on file   Number of children: Not on file   Years of education: Not on file   Highest education level: Not on file  Occupational History   Not on file  Tobacco Use   Smoking status: Never   Smokeless tobacco: Never  Vaping Use   Vaping status: Never Used  Substance and Sexual Activity   Alcohol use: Yes    Alcohol/week: 35.0 standard drinks of alcohol    Types: 21 Glasses of wine, 14 Standard drinks or equivalent per week    Comment: glass of wine nightly   Drug use: No   Sexual activity: Yes    Partners: Male    Comment: no dietary restrictions, lives with husband  Other Topics Concern   Not on file  Social History Narrative   Not on file   Social Drivers of Health   Tobacco Use: Low Risk (08/05/2024)   Patient History    Smoking Tobacco  Use: Never    Smokeless Tobacco Use: Never    Passive Exposure: Not on file  Financial Resource Strain: Not on file  Food Insecurity: Not on file  Transportation Needs: Not on file  Physical Activity: Not on file  Stress: Not on file  Social Connections: Not on file  Intimate Partner Violence: Not on file  Depression (PHQ2-9): Low Risk (08/05/2024)   Depression (PHQ2-9)    PHQ-2 Score: 0  Alcohol Screen: Not on file  Housing: Not on file  Utilities: Not on file  Health Literacy: Not on file    Outpatient Medications Prior to Visit  Medication Sig Dispense Refill   metoprolol  succinate (TOPROL -XL) 50 MG 24 hr tablet TAKE 1 TABLET BY MOUTH DAILY. TAKE WITH OR IMMEDIATELY FOLLOWING A MEAL. 90 tablet 0   pantoprazole  (PROTONIX ) 40 MG tablet Take 1 tablet (40 mg total) by mouth daily. 90 tablet 1   sertraline  (ZOLOFT ) 100 MG tablet TAKE 1.5 TABLETS (150MG  TOTAL) BY MOUTH DAILY 135 tablet 0   thiamine (VITAMIN B-1) 50 MG tablet Take 1 tablet (50 mg total) by mouth daily. 90 tablet 0   semaglutide -weight management (WEGOVY ) 0.25 MG/0.5ML SOAJ SQ injection Inject 0.25 mg into the skin once a week. After 4 weeks, can increase to 0.5 mg weekly for 4 weeks, then 1 mg weekly for 4 weeks 2 mL 1   No facility-administered medications prior to visit.    Allergies  Allergen Reactions   Hydrochlorothiazide  Other (See Comments)    Had weird dreams and made her feel bad.   Penicillins Rash    Did it involve swelling of the face/tongue/throat, SOB, or low BP? No Did it involve sudden or severe rash/hives, skin peeling, or any reaction on the inside of your mouth or nose? No Did you need to seek medical attention at a hospital or doctor's office? No When did it last happen?      childhood allergy If all above answers are NO, may proceed with cephalosporin use.     ROS    See HPI Objective:    Physical Exam Vitals reviewed.  Constitutional:      General: She is not in acute distress.     Appearance: She is not toxic-appearing.  HENT:     Head: Normocephalic and atraumatic.     Mouth/Throat:     Mouth: Mucous membranes are moist.     Pharynx:  Oropharynx is clear.  Eyes:     Pupils: Pupils are equal, round, and reactive to light.  Cardiovascular:     Rate and Rhythm: Normal rate and regular rhythm.     Pulses: Normal pulses.     Heart sounds: Normal heart sounds. No murmur heard. Pulmonary:     Effort: Pulmonary effort is normal. No respiratory distress.     Breath sounds: Normal breath sounds. No wheezing.  Musculoskeletal:        General: No swelling.     Cervical back: Neck supple.     Comments: Lumbar spine- TWP, Full ROM, normal gait.  Skin:    General: Skin is warm and dry.  Neurological:     General: No focal deficit present.     Mental Status: She is alert and oriented to person, place, and time.  Psychiatric:        Mood and Affect: Mood normal.        Behavior: Behavior normal.        Thought Content: Thought content normal.        Judgment: Judgment normal.      BP (!) 144/91   Pulse 82   Ht 5' 1 (1.549 m)   Wt 161 lb (73 kg)   LMP 09/08/2011   SpO2 99%   BMI 30.42 kg/m  Wt Readings from Last 3 Encounters:  08/05/24 161 lb (73 kg)  05/22/24 166 lb 12.8 oz (75.7 kg)  10/22/23 163 lb (73.9 kg)       Assessment & Plan:   Problem List Items Addressed This Visit       Cardiovascular and Mediastinum   Primary hypertension   Blood pressure is not at goal for age and co-morbidities.   -Rx- start Amlodipine  5 mg daily -FU 4 weeks nurse visit for BP recheck - BP goal <130/80 - monitor and log blood pressures at home - check around the same time each day in a relaxed setting - Limit salt to <2000 mg/day - Follow DASH eating plan (heart healthy diet) - limit alcohol to 2 standard drinks per day for men and 1 per day for women - avoid tobacco products - get at least 2 hours of regular aerobic exercise weekly Patient aware of  signs/symptoms requiring further/urgent evaluation. Labs updated today.       Relevant Medications   amLODipine  (NORVASC ) 5 MG tablet     Endocrine   Hyperglycemia   hgba1c acceptable, minimize simple carbs. Increase exercise as tolerated.       Relevant Orders   HgB A1c     Other   Hyperlipidemia   Encourage heart healthy diet such as MIND or DASH diet, increase exercise, avoid trans fats, simple carbohydrates and processed foods, consider a krill or fish or flaxseed oil cap daily.  -Refer for coronary Ct scan      Relevant Medications   amLODipine  (NORVASC ) 5 MG tablet   Obesity (BMI 30-39.9)   Wt Readings from Last 3 Encounters:  08/05/24 161 lb (73 kg)  05/22/24 166 lb 12.8 oz (75.7 kg)  10/22/23 163 lb (73.9 kg)    BMI Readings from Last 3 Encounters:  08/05/24 30.42 kg/m  05/22/24 31.52 kg/m  10/22/23 30.80 kg/m    - Increased Wegovy  to 1 mg weekly. - Advised to maintain protein intake and take a daily multivitamin. - Scheduled follow-up every 3 months      Relevant Medications   semaglutide -weight management (WEGOVY ) 1 MG/0.5ML SOAJ SQ injection  Vitamin D  insufficiency - Primary   Supplement and monitor       Relevant Orders   Vitamin D  (25 hydroxy)   Other Visit Diagnoses       Lumbar pain       Relevant Orders   DG Lumbar Spine Complete     Need for pneumococcal 20-valent conjugate vaccination       Relevant Orders   Pneumococcal conjugate vaccine 20-valent (Prevnar 20) (Completed)       Coccyx and lumbar pain after fall (possible fracture) Persistent pain post-fall. Pain improving - Lumbar Xray pending - Recommended Tylenol or ibuprofen OTC for pain management. - If pain persist or worsens consider physical therapy referral  General health maintenance - Will schedule Pap smear at next visit. - Administered pneumonia vaccine today.  FU 4 weeks nurse visit, BP recheck FU 3 months OV   I have discontinued Valree K. Sposito Kim's  Wegovy . I am also having her start on amLODipine , Wegovy , multivitamin with minerals, and Vitamin D3. Additionally, I am having her maintain her pantoprazole , thiamine, sertraline , and metoprolol  succinate.  Meds ordered this encounter  Medications   amLODipine  (NORVASC ) 5 MG tablet    Sig: Take 1 tablet (5 mg total) by mouth daily.    Dispense:  90 tablet    Refill:  1    Supervising Provider:   DOMENICA BLACKBIRD A [[id number]]   semaglutide -weight management (WEGOVY ) 1 MG/0.5ML SOAJ SQ injection    Sig: Inject 1 mg into the skin once a week.    Dispense:  2 mL    Refill:  3    Supervising Provider:   DOMENICA BLACKBIRD A [[id number]]   Multiple Vitamins-Minerals (MULTIVITAMIN WITH MINERALS) tablet    Sig: Take 1 tablet by mouth daily.    Supervising Provider:   DOMENICA BLACKBIRD LABOR [[id number]]   Cholecalciferol (VITAMIN D3) 25 MCG (1000 UT) CAPS    Sig: Take 1 capsule (1,000 Units total) by mouth daily.    Supervising Provider:   DOMENICA BLACKBIRD A [[id number]]   "

## 2024-08-05 ENCOUNTER — Other Ambulatory Visit: Payer: Self-pay | Admitting: Family Medicine

## 2024-08-05 ENCOUNTER — Ambulatory Visit: Admitting: Student

## 2024-08-05 ENCOUNTER — Encounter: Payer: Self-pay | Admitting: Student

## 2024-08-05 ENCOUNTER — Ambulatory Visit (HOSPITAL_BASED_OUTPATIENT_CLINIC_OR_DEPARTMENT_OTHER)
Admission: RE | Admit: 2024-08-05 | Discharge: 2024-08-05 | Disposition: A | Discharge status: Home / Self Care | Source: Ambulatory Visit | Attending: Student | Admitting: Student

## 2024-08-05 VITALS — BP 144/91 | HR 82 | Ht 61.0 in | Wt 161.0 lb

## 2024-08-05 DIAGNOSIS — R739 Hyperglycemia, unspecified: Secondary | ICD-10-CM | POA: Diagnosis not present

## 2024-08-05 DIAGNOSIS — Z23 Encounter for immunization: Secondary | ICD-10-CM

## 2024-08-05 DIAGNOSIS — M545 Low back pain, unspecified: Secondary | ICD-10-CM | POA: Diagnosis not present

## 2024-08-05 DIAGNOSIS — E559 Vitamin D deficiency, unspecified: Secondary | ICD-10-CM

## 2024-08-05 DIAGNOSIS — I10 Essential (primary) hypertension: Secondary | ICD-10-CM

## 2024-08-05 DIAGNOSIS — E782 Mixed hyperlipidemia: Secondary | ICD-10-CM | POA: Diagnosis not present

## 2024-08-05 DIAGNOSIS — E669 Obesity, unspecified: Secondary | ICD-10-CM | POA: Diagnosis not present

## 2024-08-05 MED ORDER — MULTI-VITAMIN/MINERALS PO TABS: 1.0000 | ORAL_TABLET | Freq: Every day | ORAL | Status: AC

## 2024-08-05 MED ORDER — AMLODIPINE BESYLATE 5 MG PO TABS: 5.0000 mg | ORAL_TABLET | Freq: Every day | ORAL | 1 refills | Status: AC

## 2024-08-05 MED ORDER — VITAMIN D3 25 MCG (1000 UT) PO CAPS: 1000.0000 [IU] | ORAL_CAPSULE | Freq: Every day | ORAL | Status: AC

## 2024-08-05 MED ORDER — WEGOVY 1 MG/0.5ML ~~LOC~~ SOAJ: 1.0000 mg | SUBCUTANEOUS | 3 refills | Status: AC

## 2024-08-05 NOTE — Assessment & Plan Note (Signed)
 hgba1c acceptable, minimize simple carbs. Increase exercise as tolerated.

## 2024-08-05 NOTE — Assessment & Plan Note (Signed)
 Wt Readings from Last 3 Encounters:  08/05/24 161 lb (73 kg)  05/22/24 166 lb 12.8 oz (75.7 kg)  10/22/23 163 lb (73.9 kg)    BMI Readings from Last 3 Encounters:  08/05/24 30.42 kg/m  05/22/24 31.52 kg/m  10/22/23 30.80 kg/m    - Increased Wegovy  to 1 mg weekly. - Advised to maintain protein intake and take a daily multivitamin. - Scheduled follow-up every 3 months

## 2024-08-05 NOTE — Assessment & Plan Note (Signed)
 Blood pressure is not at goal for age and co-morbidities.   -Rx- start Amlodipine  5 mg daily -FU 4 weeks nurse visit for BP recheck - BP goal <130/80 - monitor and log blood pressures at home - check around the same time each day in a relaxed setting - Limit salt to <2000 mg/day - Follow DASH eating plan (heart healthy diet) - limit alcohol to 2 standard drinks per day for men and 1 per day for women - avoid tobacco products - get at least 2 hours of regular aerobic exercise weekly Patient aware of signs/symptoms requiring further/urgent evaluation. Labs updated today.

## 2024-08-05 NOTE — Assessment & Plan Note (Signed)
 Encourage heart healthy diet such as MIND or DASH diet, increase exercise, avoid trans fats, simple carbohydrates and processed foods, consider a krill or fish or flaxseed oil cap daily.  -Refer for coronary Ct scan

## 2024-08-05 NOTE — Assessment & Plan Note (Signed)
 Supplement and monitor

## 2024-08-06 ENCOUNTER — Ambulatory Visit: Payer: Self-pay | Admitting: Student

## 2024-08-06 ENCOUNTER — Encounter: Payer: Self-pay | Admitting: Student

## 2024-08-06 DIAGNOSIS — E559 Vitamin D deficiency, unspecified: Secondary | ICD-10-CM

## 2024-08-06 LAB — HEMOGLOBIN A1C: Hgb A1c MFr Bld: 5 % (ref 4.6–6.5)

## 2024-08-06 LAB — VITAMIN D 25 HYDROXY (VIT D DEFICIENCY, FRACTURES): VITD: 20.93 ng/mL — ABNORMAL LOW (ref 30.00–100.00)

## 2024-08-06 MED ORDER — VITAMIN D (ERGOCALCIFEROL) 1.25 MG (50000 UNIT) PO CAPS: 50000.0000 [IU] | ORAL_CAPSULE | ORAL | 3 refills | Status: AC

## 2024-09-05 ENCOUNTER — Ambulatory Visit

## 2024-09-05 VITALS — BP 126/86 | HR 90

## 2024-09-05 DIAGNOSIS — I10 Essential (primary) hypertension: Secondary | ICD-10-CM

## 2024-09-05 NOTE — Progress Notes (Signed)
 Pt here for Blood pressure check per JY  Pt currently takes: Amlodipine  5mg , once daily    Metoprolol  50mg ; once daily   Pt reports compliance with medication and taking both bp meds at night  BP today @ = 128/90 HR = 90   Second blood pressure check: 126/86  Pt advised Per Dr. Amon to increase Amlodipine  from 5mg  to 10mg  and to follow up with JY in 1 month.

## 2024-10-03 ENCOUNTER — Ambulatory Visit: Admitting: Student

## 2024-12-31 ENCOUNTER — Ambulatory Visit: Admitting: Dermatology
